# Patient Record
Sex: Female | Born: 1955 | Race: White | Hispanic: No | Marital: Married | Smoking: Never smoker
Health system: Southern US, Community
[De-identification: ages and names within clinical notes are randomized; demographics above are authoritative.]

## PROBLEM LIST (undated history)

## (undated) DIAGNOSIS — I2510 Atherosclerotic heart disease of native coronary artery without angina pectoris: Secondary | ICD-10-CM

## (undated) DIAGNOSIS — I219 Acute myocardial infarction, unspecified: Secondary | ICD-10-CM

## (undated) DIAGNOSIS — F419 Anxiety disorder, unspecified: Secondary | ICD-10-CM

## (undated) HISTORY — PX: CARDIAC CATHETERIZATION: SHX172

---

## 1993-08-31 HISTORY — PX: ABDOMINAL HYSTERECTOMY: SHX81

## 1999-11-05 ENCOUNTER — Encounter: Admission: RE | Admit: 1999-11-05 | Discharge: 1999-11-05 | Payer: Self-pay

## 2005-07-13 ENCOUNTER — Ambulatory Visit: Payer: Self-pay | Admitting: Family Medicine

## 2006-07-19 ENCOUNTER — Ambulatory Visit: Payer: Self-pay | Admitting: Family Medicine

## 2007-09-13 ENCOUNTER — Ambulatory Visit: Payer: Self-pay | Admitting: Family Medicine

## 2008-09-27 ENCOUNTER — Ambulatory Visit: Payer: Self-pay | Admitting: Family Medicine

## 2010-02-12 ENCOUNTER — Ambulatory Visit: Payer: Self-pay | Admitting: Family Medicine

## 2010-09-21 ENCOUNTER — Encounter: Payer: Self-pay | Admitting: Family Medicine

## 2011-05-13 ENCOUNTER — Ambulatory Visit: Payer: Self-pay | Admitting: Family Medicine

## 2012-06-22 ENCOUNTER — Ambulatory Visit: Payer: Self-pay | Admitting: Family Medicine

## 2012-07-17 ENCOUNTER — Ambulatory Visit: Payer: Self-pay | Admitting: Physician Assistant

## 2012-10-03 ENCOUNTER — Ambulatory Visit: Payer: Self-pay | Admitting: Gastroenterology

## 2013-07-11 ENCOUNTER — Ambulatory Visit: Payer: Self-pay | Admitting: Family Medicine

## 2013-07-26 ENCOUNTER — Ambulatory Visit: Payer: Self-pay | Admitting: Family Medicine

## 2013-07-26 IMAGING — MG MM DIGITAL DIAGNOSTIC BILAT W/ CAD
1 series · 6 of 6 positions shown · non-contrast
Comparison: [DATE] and prior mammograms dating back to
[DATE]

CLINICAL DATA: 57-year-old female for annual bilateral mammograms
and with palpable thickening in the right axilla discovered on
self-examination.

EXAM:
DIGITAL DIAGNOSTIC  BILATERAL MAMMOGRAM WITH CAD
ULTRASOUND RIGHT BREAST

[R CC · right · 6 of 6 slices shown]
[im 1/6]
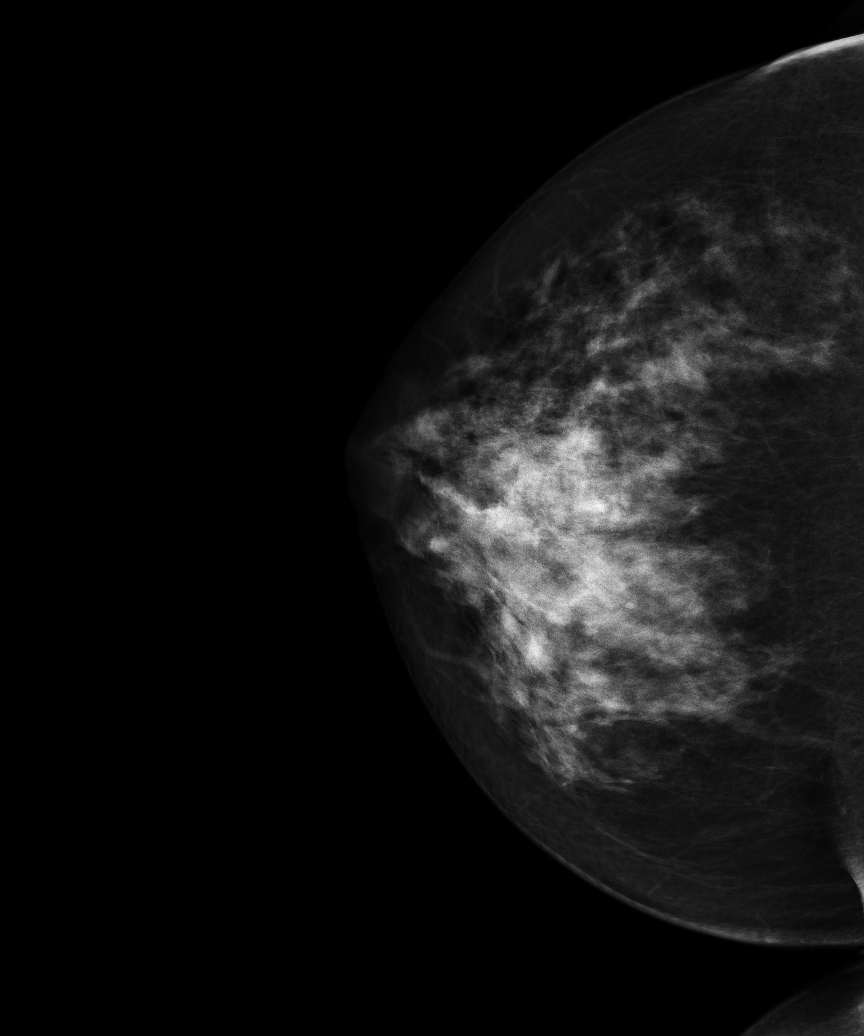
[im 2/6]
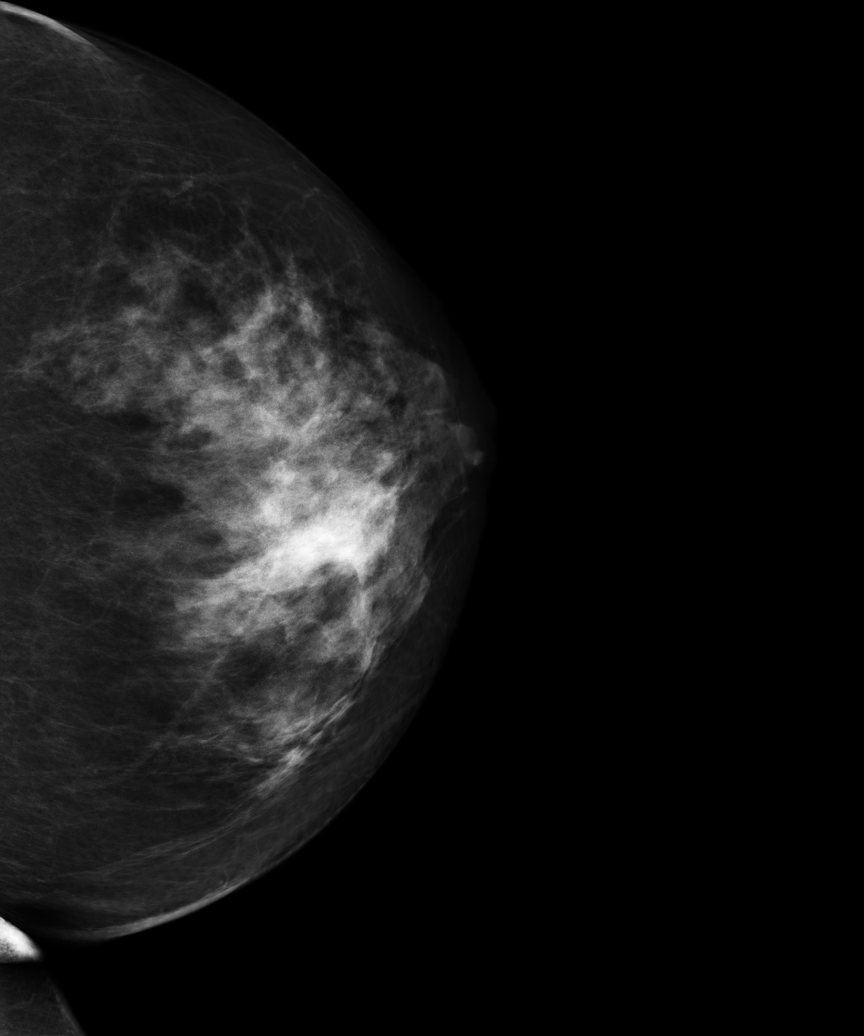
[im 3/6]
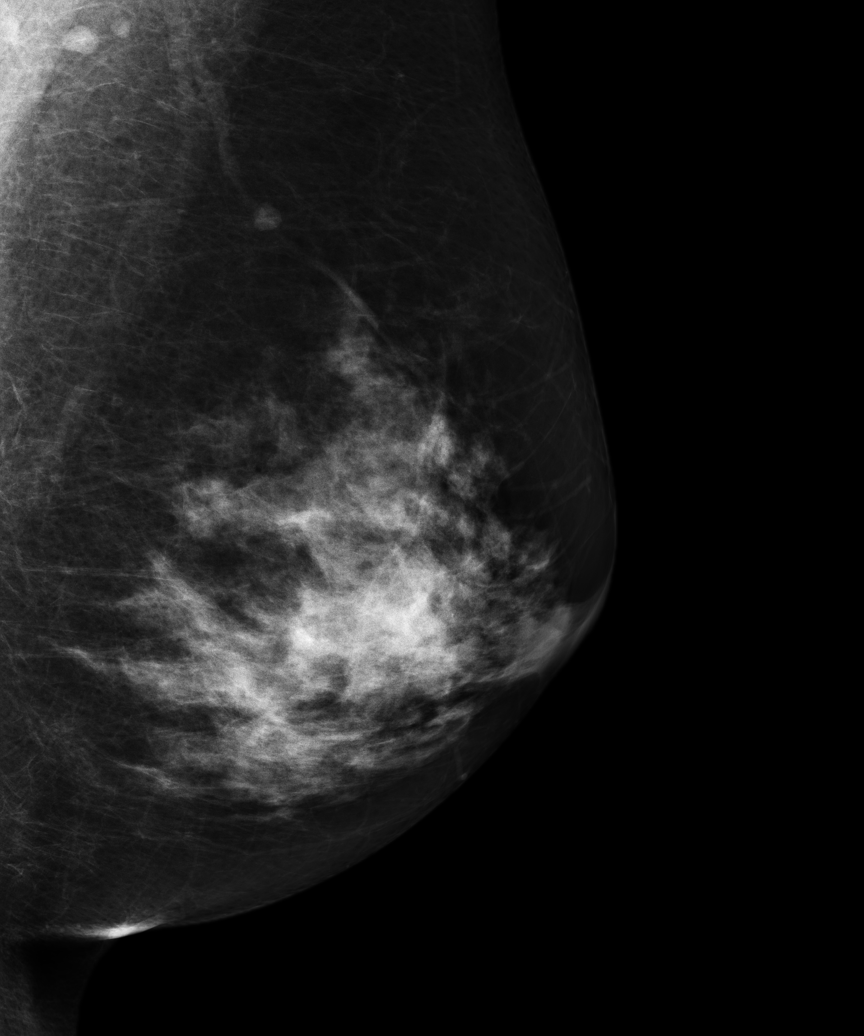
[im 4/6]
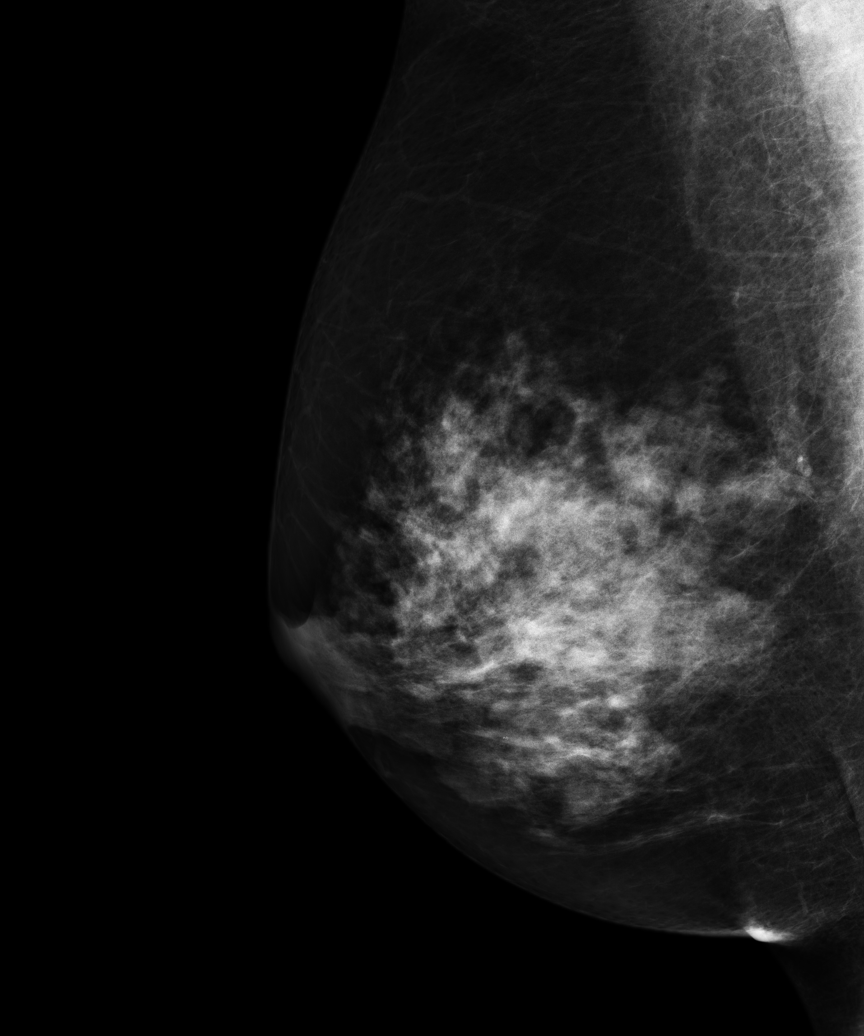
[im 5/6]
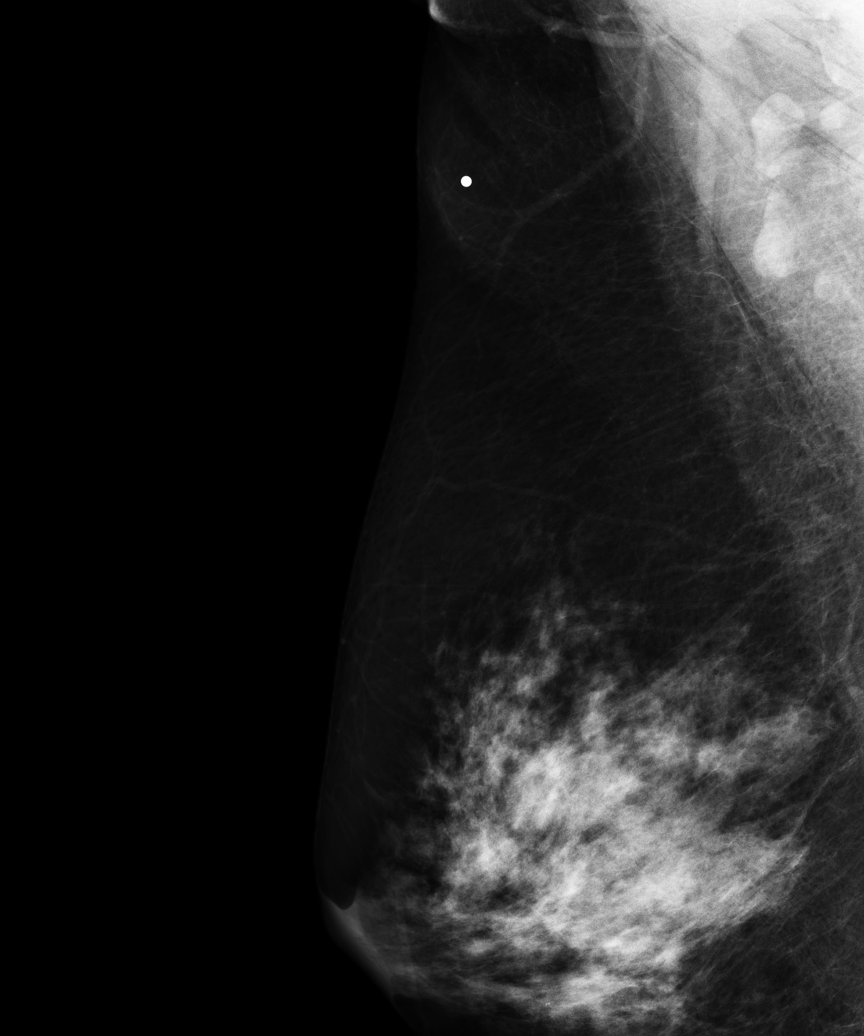
[im 6/6]
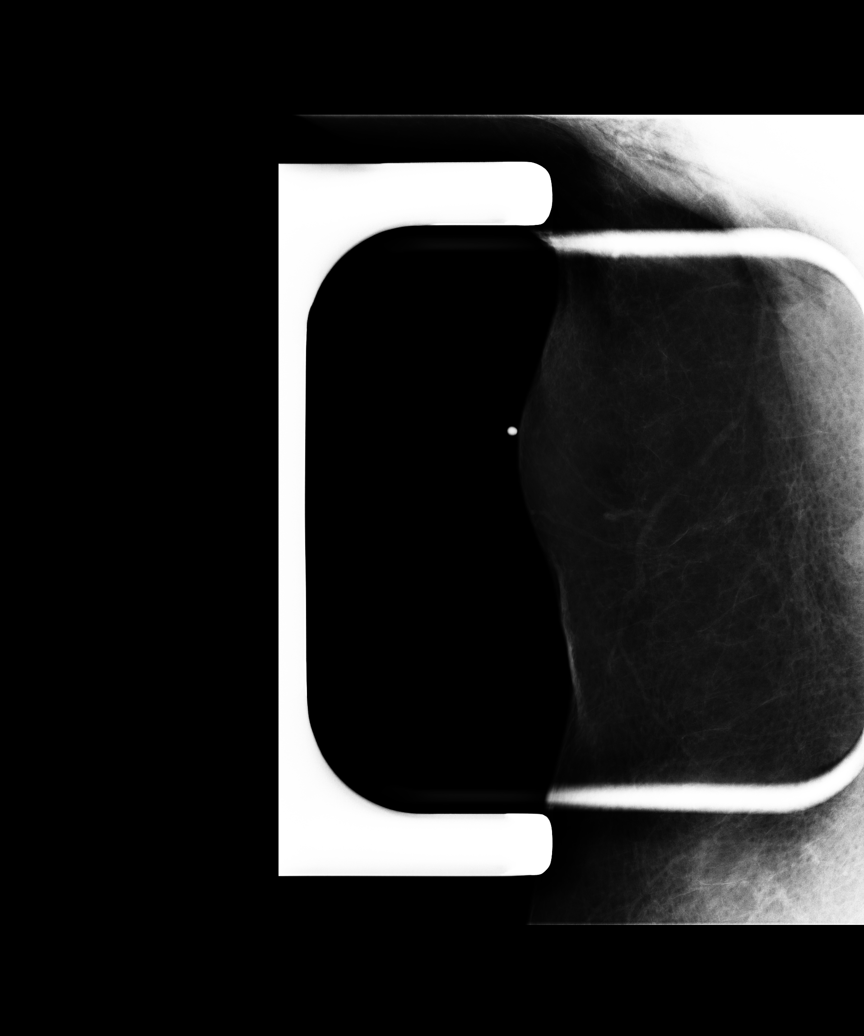

[6 of 6 positions shown; findings below may reference images not displayed]

ACR Breast Density Category c: The breasts are heterogeneously
dense, which may obscure small masses.
FINDINGS: There is no evidence of suspicious mass, distortion or worrisome
calcifications bilaterally.

A spot compression view of the right axilla demonstrates no
abnormalities.

Mammographic images were processed with CAD.

On physical exam,no palpable abnormalities are identified in the
right axilla.

Ultrasound is performed, showing no evidence of solid or cystic
mass, distortion or abnormal areas of shadowing in the right axilla,
in the area of patient concern..
IMPRESSION: No mammographic, palpable or sonographic abnormality in the right
axilla, in the area of patient concern.

No mammographic evidence of breast malignancy bilaterally.

RECOMMENDATION:
Bilateral screening mammograms in 1 year.

I have discussed the findings and recommendations with the patient.
Results were also provided in writing at the conclusion of the
visit.

BI-RADS CATEGORY  1: Negative

## 2014-09-26 ENCOUNTER — Ambulatory Visit: Payer: Self-pay | Admitting: Family Medicine

## 2014-09-26 IMAGING — MG MM DIGITAL SCREENING BILAT W/ CAD
4 series · 4 of 4 positions shown · non-contrast
Comparison: Previous exam(s).

CLINICAL DATA: Screening.

EXAM:
DIGITAL SCREENING BILATERAL MAMMOGRAM WITH CAD

[R CC]
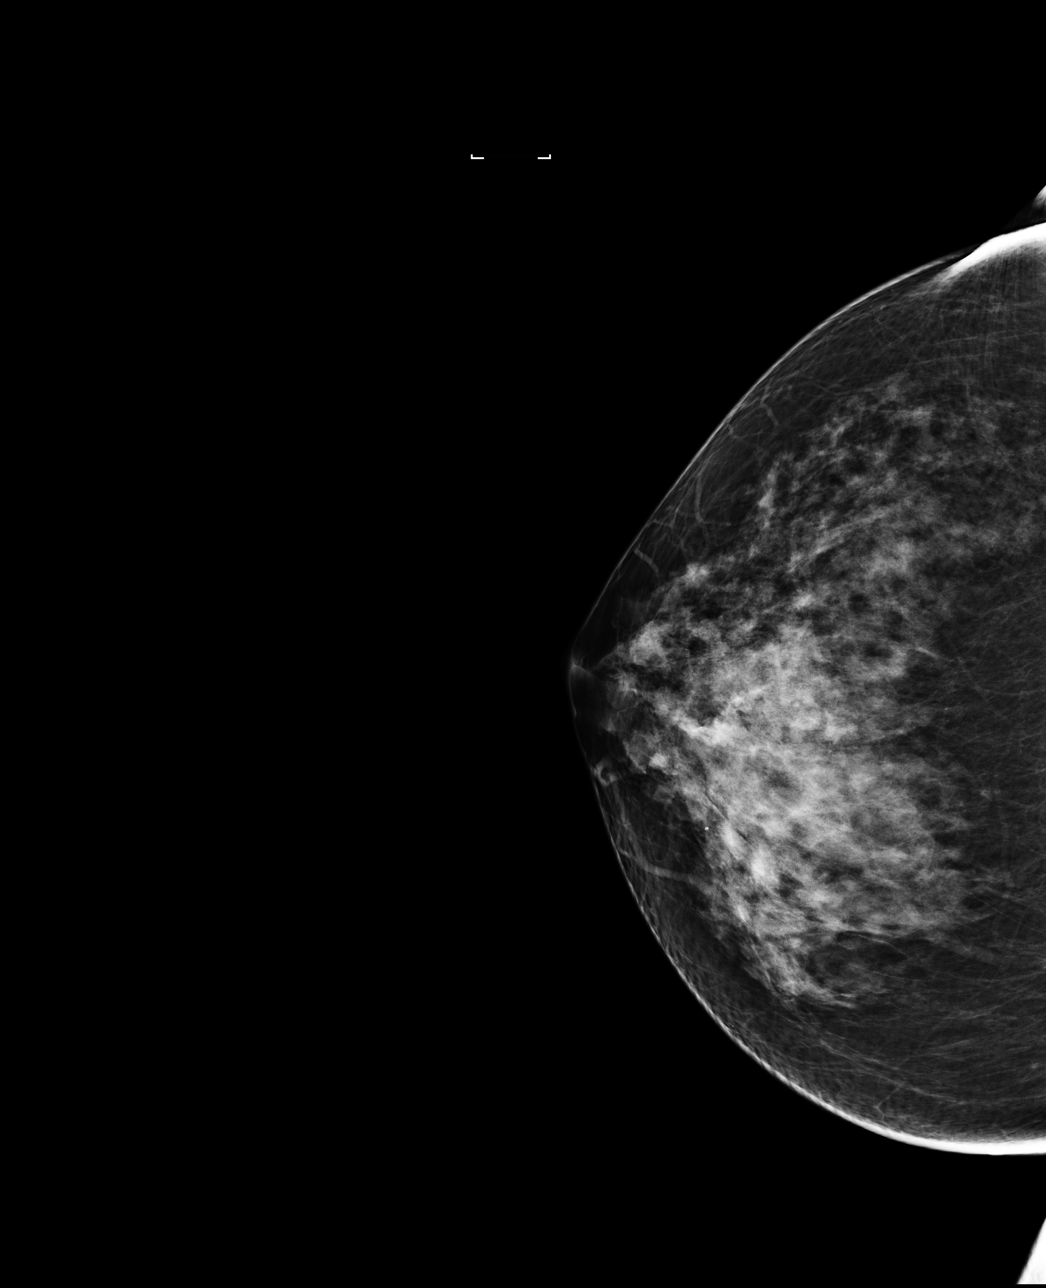

[L MLO]
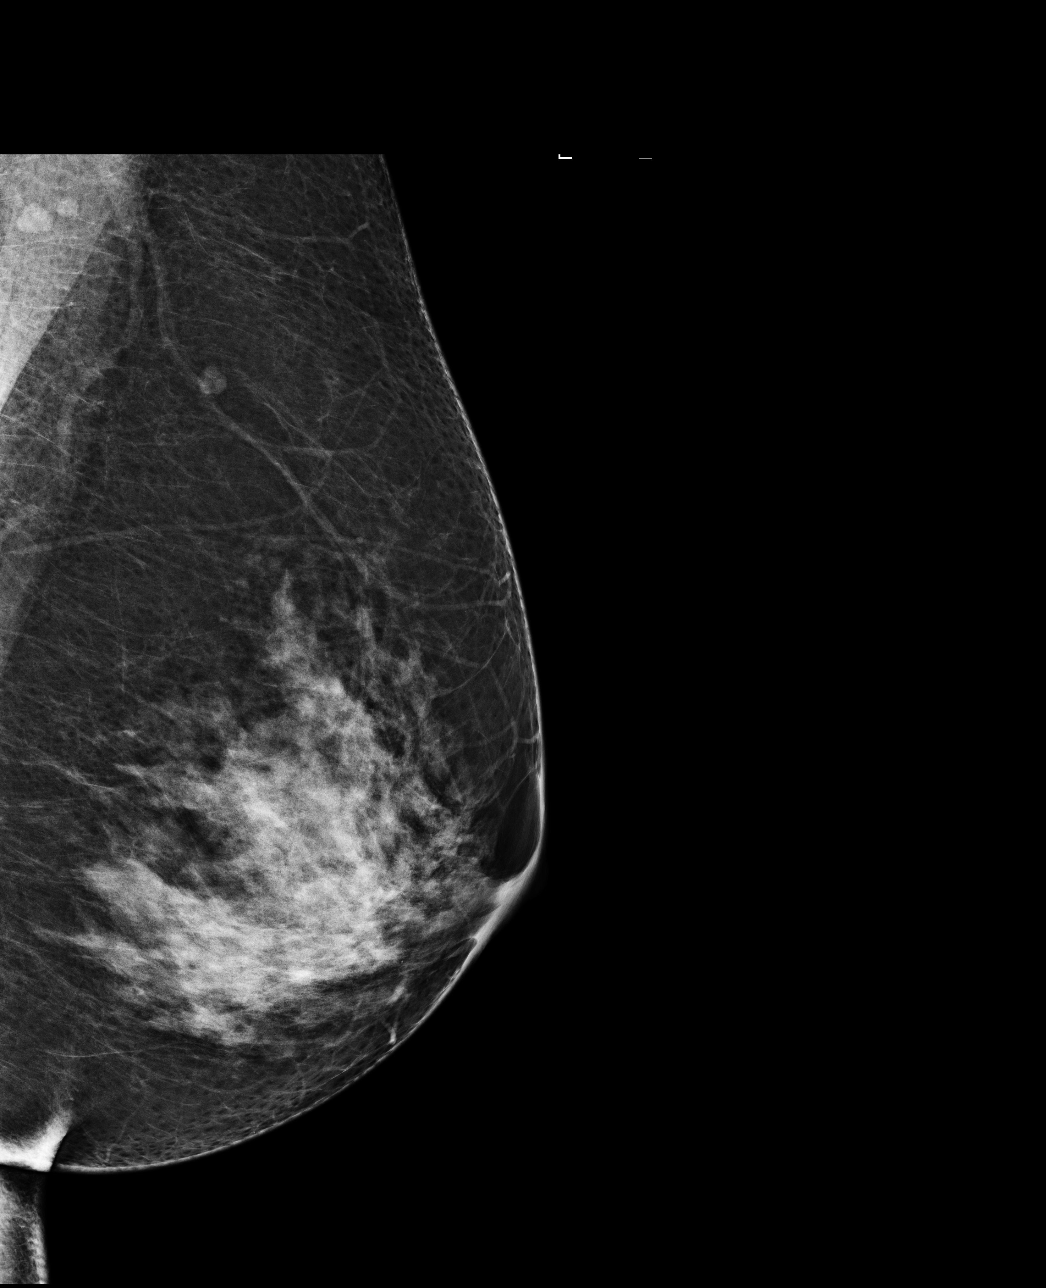

[L CC]
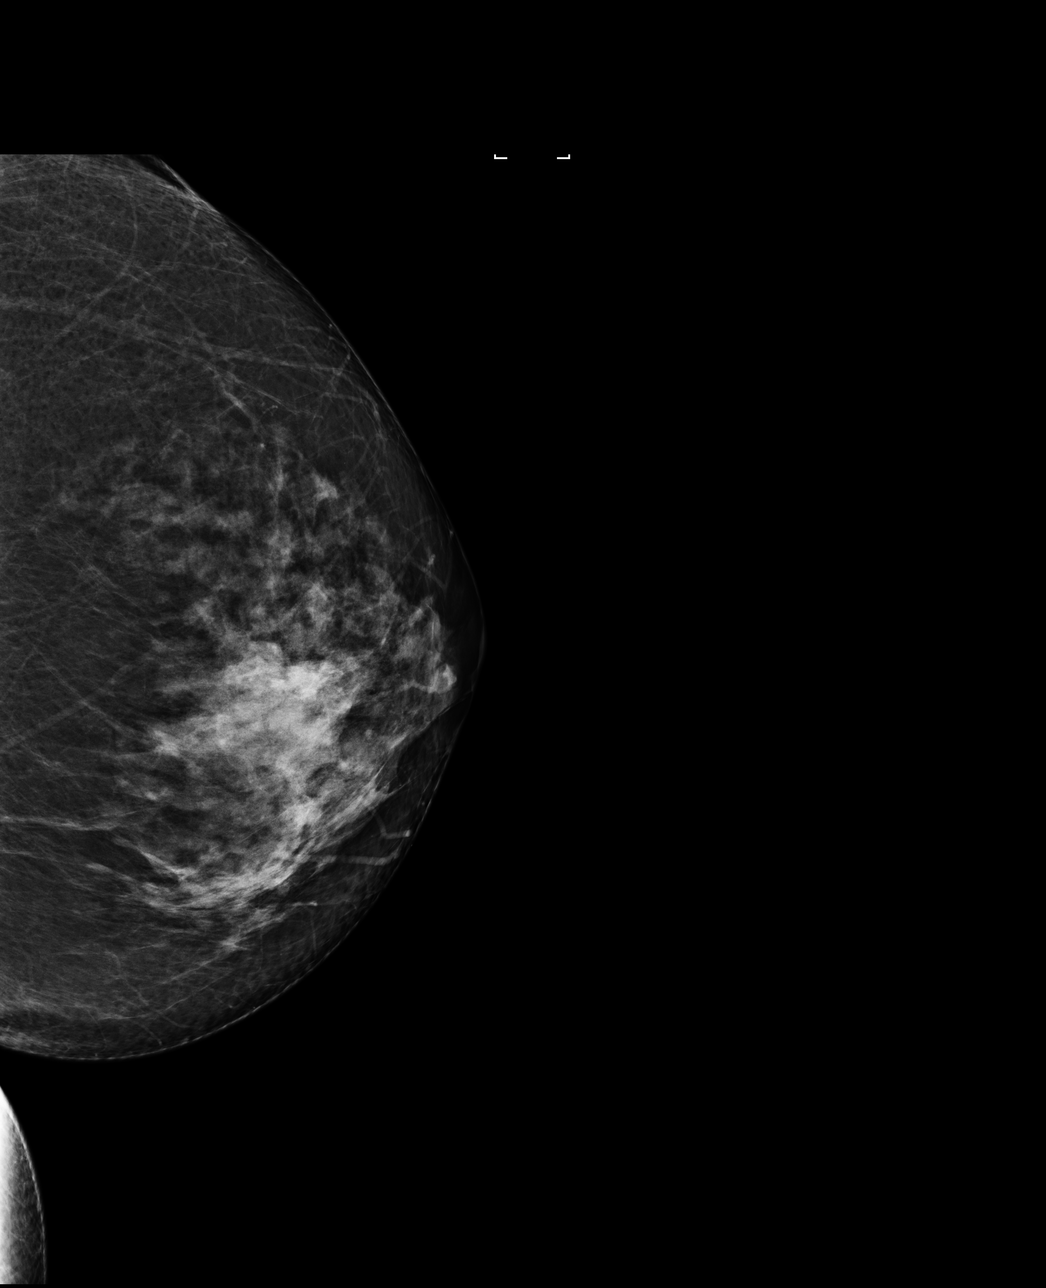

[R MLO]
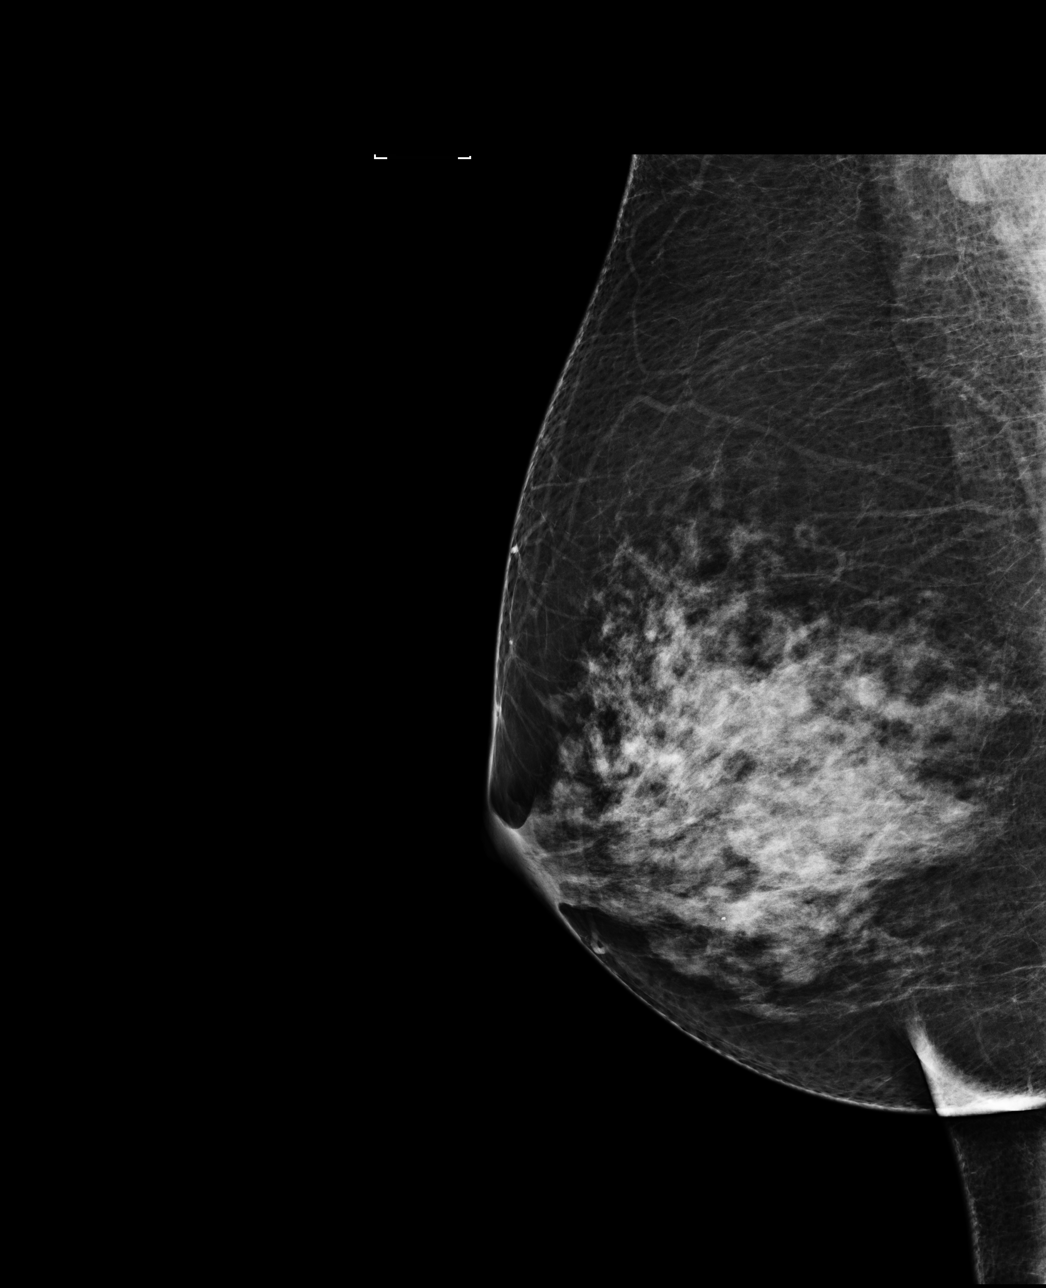

[4 of 4 positions shown; findings below may reference images not displayed]

ACR Breast Density Category c: The breast tissue is heterogeneously
dense, which may obscure small masses.
FINDINGS: There are no findings suspicious for malignancy. Images were
processed with CAD.
IMPRESSION: No mammographic evidence of malignancy. A result letter of this
screening mammogram will be mailed directly to the patient.

RECOMMENDATION:
Screening mammogram in one year. (Code:[0J])

BI-RADS CATEGORY  1: Negative.

## 2015-05-31 DIAGNOSIS — F419 Anxiety disorder, unspecified: Secondary | ICD-10-CM | POA: Insufficient documentation

## 2015-05-31 DIAGNOSIS — M8580 Other specified disorders of bone density and structure, unspecified site: Secondary | ICD-10-CM | POA: Insufficient documentation

## 2015-05-31 DIAGNOSIS — N951 Menopausal and female climacteric states: Secondary | ICD-10-CM | POA: Insufficient documentation

## 2015-05-31 DIAGNOSIS — G2581 Restless legs syndrome: Secondary | ICD-10-CM | POA: Insufficient documentation

## 2016-03-04 ENCOUNTER — Other Ambulatory Visit: Payer: Self-pay | Admitting: Internal Medicine

## 2016-03-04 DIAGNOSIS — Z1239 Encounter for other screening for malignant neoplasm of breast: Secondary | ICD-10-CM

## 2016-03-20 ENCOUNTER — Ambulatory Visit
Admission: RE | Admit: 2016-03-20 | Discharge: 2016-03-20 | Disposition: A | Discharge status: Home / Self Care | Payer: Commercial Managed Care - PPO | Source: Ambulatory Visit | Attending: Internal Medicine | Admitting: Internal Medicine

## 2016-03-20 ENCOUNTER — Other Ambulatory Visit: Payer: Self-pay | Admitting: Internal Medicine

## 2016-03-20 DIAGNOSIS — Z1231 Encounter for screening mammogram for malignant neoplasm of breast: Secondary | ICD-10-CM | POA: Insufficient documentation

## 2016-03-20 DIAGNOSIS — Z1239 Encounter for other screening for malignant neoplasm of breast: Secondary | ICD-10-CM

## 2016-03-20 IMAGING — MG MM DIGITAL SCREENING BILAT W/ TOMO W/ CAD
8 of 14 series · 8 of 30 positions shown · non-contrast
Comparison: Previous exam(s).

CLINICAL DATA: Screening.

EXAM:
2D DIGITAL SCREENING BILATERAL MAMMOGRAM WITH CAD AND ADJUNCT TOMO

[R MLO (1 of 2)]
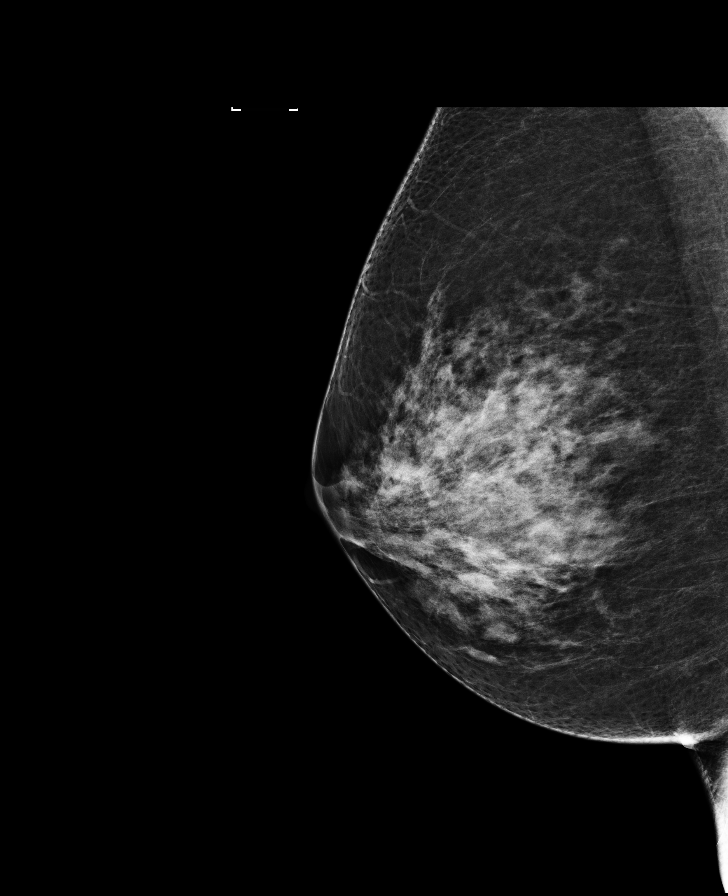

[R CC (1 of 2)]
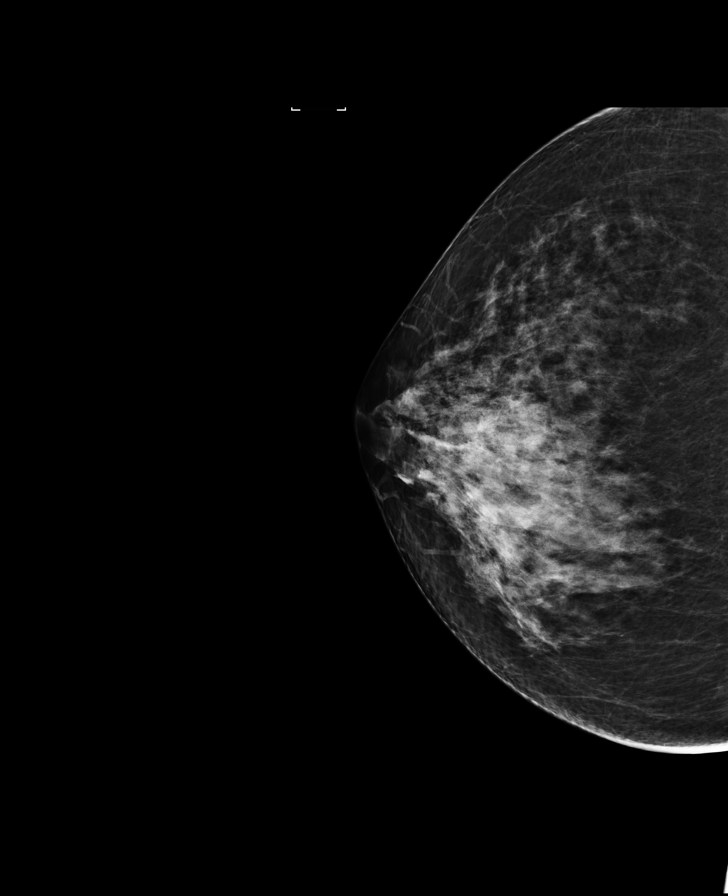

[R MLO (2 of 2)]
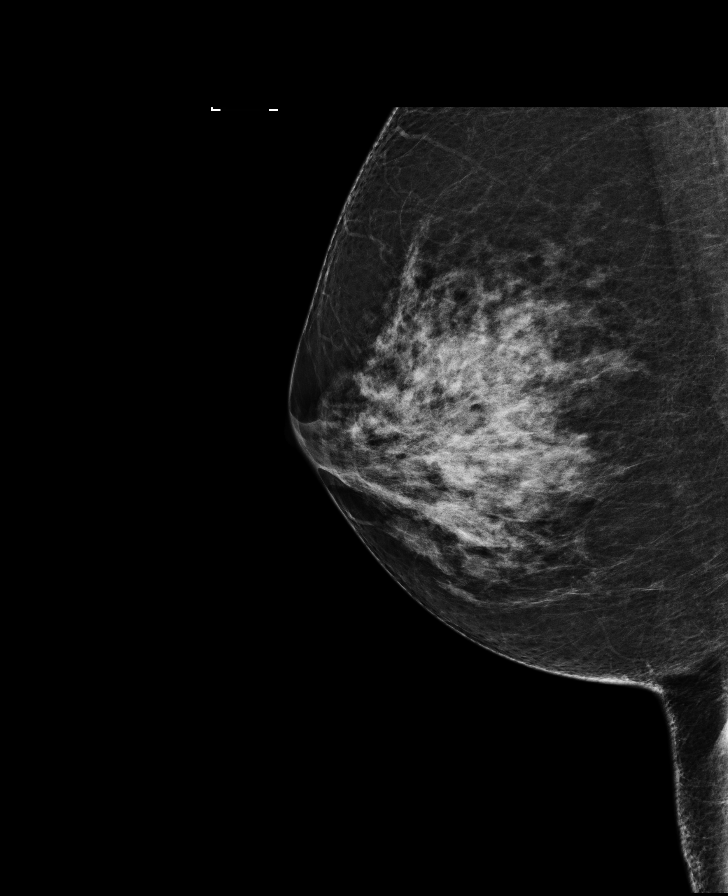

[R CC (2 of 2)]
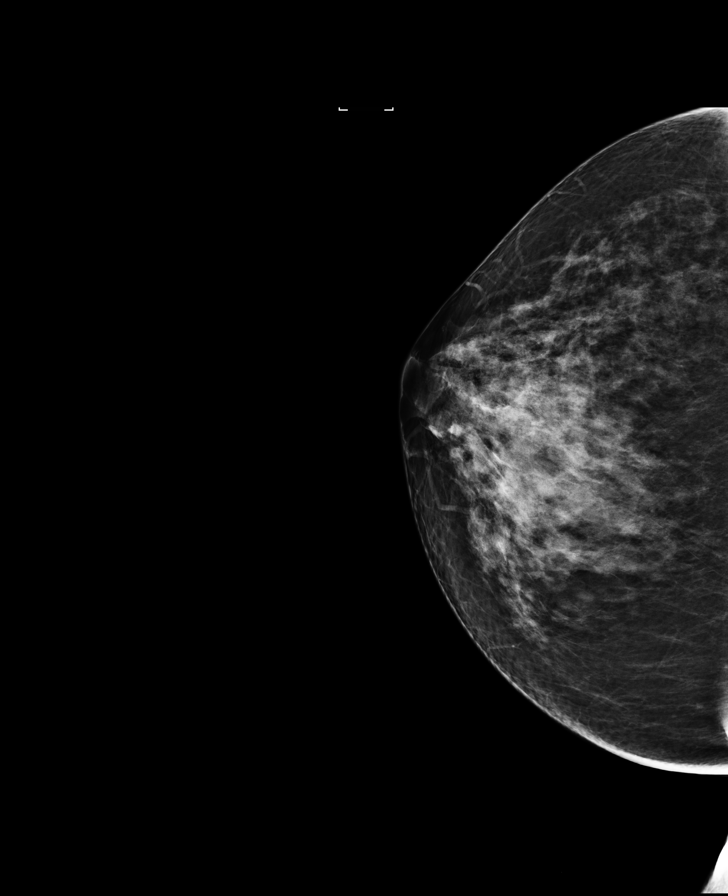

[R MLO synth-2D]
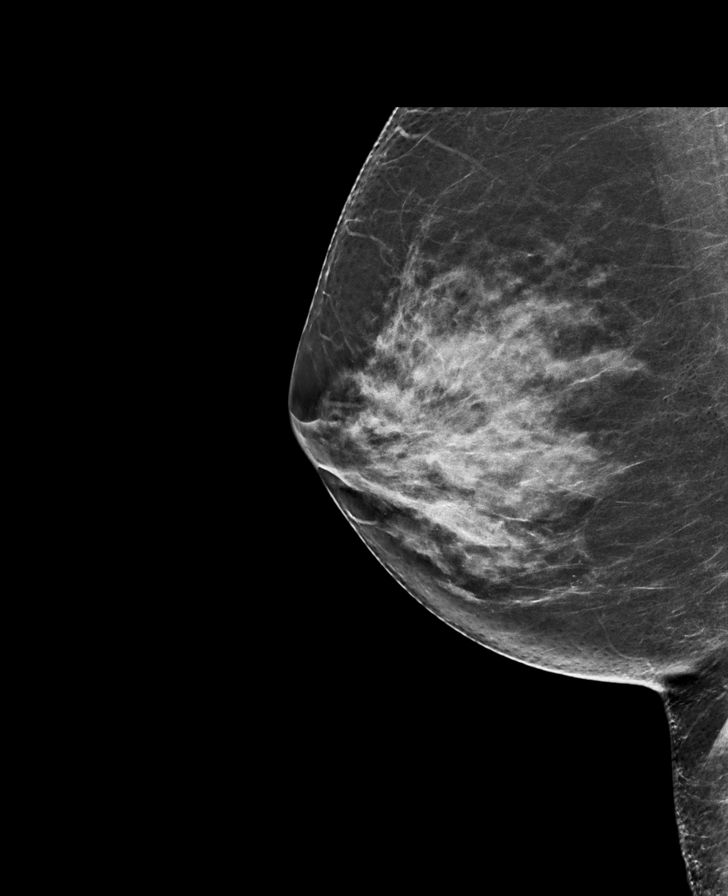

[L MLO synth-2D]
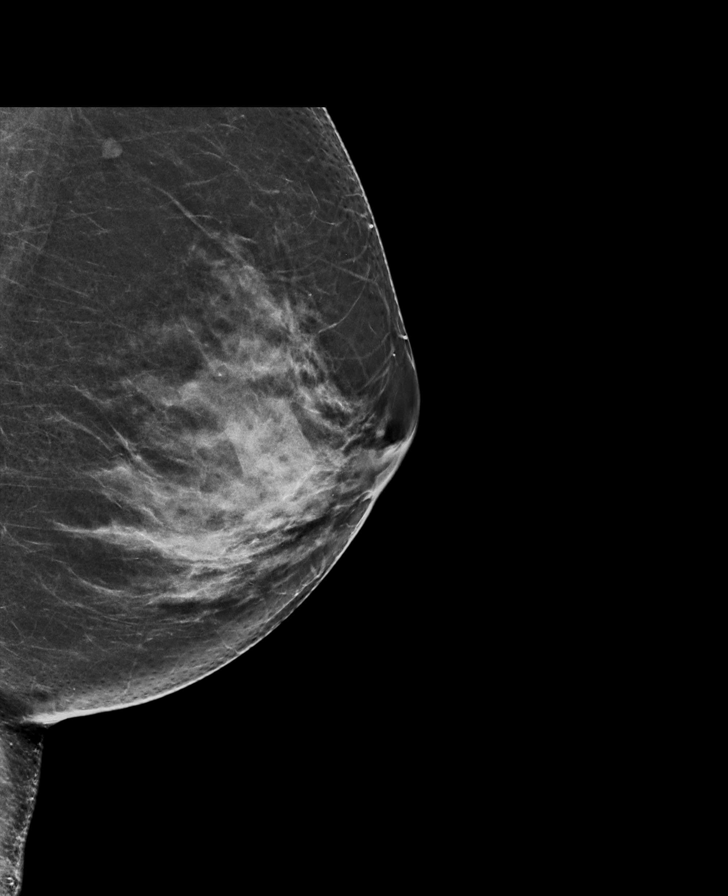

[R CC synth-2D]
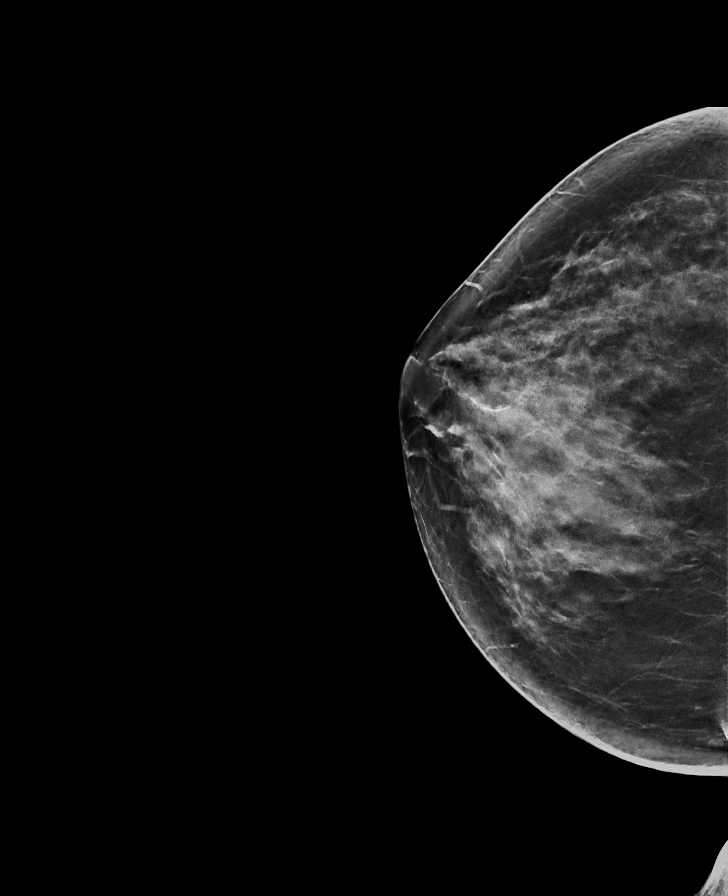

[L CC]
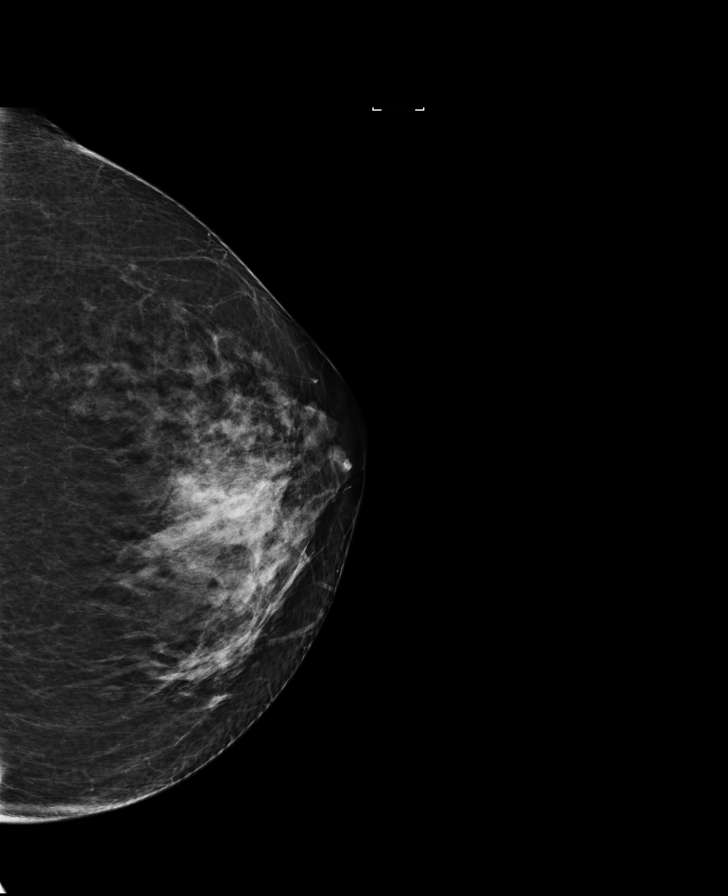

[8 of 30 positions shown; findings below may reference images not displayed]

ACR Breast Density Category d: The breast tissue is extremely dense,
which lowers the sensitivity of mammography.
FINDINGS: There are no findings suspicious for malignancy. Images were
processed with CAD.
IMPRESSION: No mammographic evidence of malignancy. A result letter of this
screening mammogram will be mailed directly to the patient.

RECOMMENDATION:
Screening mammogram in one year. (Code:[5L])

BI-RADS CATEGORY  1: Negative.

## 2016-07-29 DIAGNOSIS — G250 Essential tremor: Secondary | ICD-10-CM | POA: Insufficient documentation

## 2016-07-29 DIAGNOSIS — R7303 Prediabetes: Secondary | ICD-10-CM | POA: Insufficient documentation

## 2016-12-24 ENCOUNTER — Emergency Department
Admission: EM | Admit: 2016-12-24 | Discharge: 2016-12-24 | Disposition: A | Discharge status: Home / Self Care | Payer: Commercial Managed Care - PPO | Attending: Student in an Organized Health Care Education/Training Program | Admitting: Student in an Organized Health Care Education/Training Program

## 2016-12-24 ENCOUNTER — Encounter: Payer: Self-pay | Admitting: Emergency Medicine

## 2016-12-24 DIAGNOSIS — N3001 Acute cystitis with hematuria: Secondary | ICD-10-CM | POA: Insufficient documentation

## 2016-12-24 DIAGNOSIS — R319 Hematuria, unspecified: Secondary | ICD-10-CM | POA: Diagnosis present

## 2016-12-24 LAB — URINALYSIS, COMPLETE (UACMP) WITH MICROSCOPIC
Bacteria, UA: NONE SEEN
Bilirubin Urine: NEGATIVE
Glucose, UA: NEGATIVE mg/dL
Ketones, ur: NEGATIVE mg/dL
Nitrite: NEGATIVE
PH: 6 (ref 5.0–8.0)
Protein, ur: 100 mg/dL — AB
SPECIFIC GRAVITY, URINE: 1.013 (ref 1.005–1.030)

## 2016-12-24 MED ORDER — NITROFURANTOIN MONOHYD MACRO 100 MG PO CAPS
ORAL_CAPSULE | ORAL | Status: AC
  Administered 2016-12-24: 100 mg via ORAL
  Filled 2016-12-24: qty 1

## 2016-12-24 MED ORDER — PHENAZOPYRIDINE HCL 100 MG PO TABS
95.0000 mg | ORAL_TABLET | Freq: Once | ORAL | Status: AC
  Administered 2016-12-24: 100 mg via ORAL
  Filled 2016-12-24: qty 1

## 2016-12-24 MED ORDER — PHENAZOPYRIDINE HCL 200 MG PO TABS
ORAL_TABLET | ORAL | Status: AC
  Administered 2016-12-24: 100 mg via ORAL
  Filled 2016-12-24: qty 1

## 2016-12-24 MED ORDER — NITROFURANTOIN MONOHYD MACRO 100 MG PO CAPS: 100.0000 mg | ORAL_CAPSULE | Freq: Two times a day (BID) | ORAL | 0 refills | Status: AC

## 2016-12-24 MED ORDER — NITROFURANTOIN MONOHYD MACRO 100 MG PO CAPS
100.0000 mg | ORAL_CAPSULE | Freq: Once | ORAL | Status: AC
  Administered 2016-12-24: 100 mg via ORAL
  Filled 2016-12-24: qty 1

## 2016-12-24 NOTE — ED Notes (Signed)

## 2016-12-24 NOTE — ED Provider Notes (Signed)
[facility] Emergency Department Provider Note    None    (approximate)  I have reviewed the triage vital signs and the nursing notes.   HISTORY  Chief Complaint Urinary Frequency and Hematuria    HPI Renee Stephens is a 61 y.o. female presents with chief complaint day of increased urinary frequency difficulty in taking her bladder since Monday and hematuria that started today. No flank pain. Denies any vaginal discharge. No fevers. States that she does have pain as 10 out of 10 in severity when she is urinating. Describes as burning in sensation.  No history of recurrent UTIs. No recent antibiotic use. Took one Cystex this afternoon with some improvement. Denies any flank pain.  No new medications.   History reviewed. No pertinent past medical history. Family History  Problem Relation Age of Onset  . Breast cancer Neg Hx    Past Surgical History:  Procedure Laterality Date  . ABDOMINAL HYSTERECTOMY     There are no active problems to display for this patient.     Prior to Admission medications   Medication Sig Start Date End Date Taking? Authorizing Provider  nitrofurantoin, macrocrystal-monohydrate, (MACROBID) 100 MG capsule Take 1 capsule (100 mg total) by mouth 2 (two) times daily. 12/24/16 12/29/16  Merlyn Lot, MD    Allergies Patient has no known allergies.    Social History Social History  Substance Use Topics  . Smoking status: Never Smoker  . Smokeless tobacco: Never Used  . Alcohol use No    Review of Systems Patient denies headaches, rhinorrhea, blurry vision, numbness, shortness of breath, chest pain, edema, cough, abdominal pain, nausea, vomiting, diarrhea, dysuria, fevers, rashes or hallucinations unless otherwise stated above in HPI. ____________________________________________   PHYSICAL EXAM:  VITAL SIGNS: Vitals:   12/24/16 1954 12/24/16 2128  BP: (!) 146/91 134/67  Pulse: 90 82  Resp: 16 18  Temp:  98.4 F (36.9 C)     Constitutional: Alert and oriented. Well appearing and in no acute distress. Eyes: Conjunctivae are normal. PERRL. EOMI. Head: Atraumatic. Nose: No congestion/rhinnorhea. Mouth/Throat: Mucous membranes are moist.  Oropharynx non-erythematous. Neck: No stridor. Painless ROM. No cervical spine tenderness to palpation Hematological/Lymphatic/Immunilogical: No cervical lymphadenopathy. Cardiovascular: Normal rate, regular rhythm. Grossly normal heart sounds.  Good peripheral circulation. Respiratory: Normal respiratory effort.  No retractions. Lungs CTAB. Gastrointestinal: Soft and nontender. No distention. No abdominal bruits. No CVA tenderness. Musculoskeletal: No lower extremity tenderness nor edema.  No joint effusions. Neurologic:  Normal speech and language. No gross focal neurologic deficits are appreciated. No gait instability. Skin:  Skin is warm, dry and intact. No rash noted. Psychiatric: Mood and affect are normal. Speech and behavior are normal.  ____________________________________________   LABS (all labs ordered are listed, but only abnormal results are displayed)  Results for orders placed or performed during the hospital encounter of 12/24/16 (from the past 24 hour(s))  Urinalysis, Complete w Microscopic     Status: Abnormal   Collection Time: 12/24/16  7:53 PM  Result Value Ref Range   Color, Urine YELLOW (A) YELLOW   APPearance CLOUDY (A) CLEAR   Specific Gravity, Urine 1.013 1.005 - 1.030   pH 6.0 5.0 - 8.0   Glucose, UA NEGATIVE NEGATIVE mg/dL   Hgb urine dipstick LARGE (A) NEGATIVE   Bilirubin Urine NEGATIVE NEGATIVE   Ketones, ur NEGATIVE NEGATIVE mg/dL   Protein, ur 100 (A) NEGATIVE mg/dL   Nitrite NEGATIVE NEGATIVE   Leukocytes, UA LARGE (A) NEGATIVE  RBC / HPF TOO NUMEROUS TO COUNT 0 - 5 RBC/hpf   WBC, UA TOO NUMEROUS TO COUNT 0 - 5 WBC/hpf   Bacteria, UA NONE SEEN NONE SEEN   Squamous Epithelial / LPF 0-5 (A) NONE SEEN   WBC  Clumps PRESENT    Non Squamous Epithelial 0-5 (A) NONE SEEN   ____________________________________________ ____________________________________________  RADIOLOGY   ____________________________________________   PROCEDURES  Procedure(s) performed:  Procedures    Critical Care performed: no ____________________________________________   INITIAL IMPRESSION / ASSESSMENT AND PLAN / ED COURSE  Pertinent labs & imaging results that were available during my care of the patient were reviewed by me and considered in my medical decision making (see chart for details).  DDX: cystitis, urethritis, pyelonephritis  [name] is a 61 y.o. who presents to the ED with Dysuria, frequency and now hematuria. Patient's afebrile and otherwise stable. Denies any other complaints. No flank pain. Her abdominal exam is soft and benign. Setting of dysuria with increased frequency of March white cells and red cells on urinalysis do suspect some component of cystitis and will treat accordingly. No significant bacteria on her urinalysis therefore will send for culture. She has no signs of pyelonephritis will start on Macrobid. Do not feel that further diagnostic testing is clinically indicated at this time.  Patient was able to tolerate PO and was able to ambulate with a steady gait.  Have discussed with the patient and available family all diagnostics and treatments performed thus far and all questions were answered to the best of my ability. The patient demonstrates understanding and agreement with plan.       ____________________________________________   FINAL CLINICAL IMPRESSION(S) / ED DIAGNOSES  Final diagnoses:  Acute cystitis with hematuria      NEW MEDICATIONS STARTED DURING THIS VISIT:  Discharge Medication List as of 12/24/2016  9:21 PM    START taking these medications   Details  nitrofurantoin, macrocrystal-monohydrate, (MACROBID) 100 MG capsule Take 1 capsule (100 mg  total) by mouth 2 (two) times daily., Starting Thu 12/24/2016, Until Tue 12/29/2016, Print         Note:  This document was prepared using Dragon voice recognition software and may include unintentional dictation errors.    Merlyn Lot, MD 12/24/16 2234

## 2016-12-24 NOTE — ED Triage Notes (Signed)
Pt ambulatory to triage with steady fast gait, pt look to be in pain. Pt c/o urinary frequency, retention, hematuria since Monday. Pt to ED today for worsening symptoms and sts the hematuria started today and pt unable to get to urgent care before closing. Pts pain 10/10.

## 2016-12-27 LAB — URINE CULTURE

## 2017-02-16 ENCOUNTER — Other Ambulatory Visit: Payer: Self-pay | Admitting: Internal Medicine

## 2017-02-16 DIAGNOSIS — Z1231 Encounter for screening mammogram for malignant neoplasm of breast: Secondary | ICD-10-CM

## 2017-03-10 ENCOUNTER — Encounter: Payer: Self-pay | Admitting: *Deleted

## 2017-03-10 ENCOUNTER — Emergency Department
Admission: EM | Admit: 2017-03-10 | Discharge: 2017-03-11 | Disposition: A | Discharge status: Home / Self Care | Payer: Commercial Managed Care - PPO | Attending: Emergency Medicine | Admitting: Emergency Medicine

## 2017-03-10 DIAGNOSIS — N132 Hydronephrosis with renal and ureteral calculous obstruction: Secondary | ICD-10-CM | POA: Diagnosis not present

## 2017-03-10 DIAGNOSIS — R1032 Left lower quadrant pain: Secondary | ICD-10-CM | POA: Diagnosis present

## 2017-03-10 DIAGNOSIS — N23 Unspecified renal colic: Secondary | ICD-10-CM | POA: Insufficient documentation

## 2017-03-10 LAB — CBC WITH DIFFERENTIAL/PLATELET
Basophils Absolute: 0.1 10*3/uL (ref 0–0.1)
Basophils Relative: 1 %
EOS ABS: 0.4 10*3/uL (ref 0–0.7)
Eosinophils Relative: 5 %
HEMATOCRIT: 36.5 % (ref 35.0–47.0)
HEMOGLOBIN: 12.6 g/dL (ref 12.0–16.0)
LYMPHS ABS: 3.3 10*3/uL (ref 1.0–3.6)
Lymphocytes Relative: 41 %
MCH: 29.4 pg (ref 26.0–34.0)
MCHC: 34.5 g/dL (ref 32.0–36.0)
MCV: 85.1 fL (ref 80.0–100.0)
MONO ABS: 0.9 10*3/uL (ref 0.2–0.9)
MONOS PCT: 12 %
NEUTROS ABS: 3.2 10*3/uL (ref 1.4–6.5)
Neutrophils Relative %: 41 %
Platelets: 286 10*3/uL (ref 150–440)
RBC: 4.29 MIL/uL (ref 3.80–5.20)
RDW: 14.7 % — AB (ref 11.5–14.5)
WBC: 7.8 10*3/uL (ref 3.6–11.0)

## 2017-03-10 LAB — COMPREHENSIVE METABOLIC PANEL
ALT: 32 U/L (ref 14–54)
AST: 33 U/L (ref 15–41)
Albumin: 4.4 g/dL (ref 3.5–5.0)
Alkaline Phosphatase: 76 U/L (ref 38–126)
Anion gap: 11 (ref 5–15)
BILIRUBIN TOTAL: 0.7 mg/dL (ref 0.3–1.2)
BUN: 26 mg/dL — ABNORMAL HIGH (ref 6–20)
CALCIUM: 9.5 mg/dL (ref 8.9–10.3)
CO2: 23 mmol/L (ref 22–32)
Chloride: 105 mmol/L (ref 101–111)
Creatinine, Ser: 0.95 mg/dL (ref 0.44–1.00)
GFR calc non Af Amer: >60 mL/min (ref >60)
Glucose, Bld: 115 mg/dL — ABNORMAL HIGH (ref 65–99)
POTASSIUM: 3.7 mmol/L (ref 3.5–5.1)
Sodium: 139 mmol/L (ref 135–145)
TOTAL PROTEIN: 7.6 g/dL (ref 6.5–8.1)

## 2017-03-10 LAB — LIPASE, BLOOD: LIPASE: 35 U/L (ref 11–51)

## 2017-03-10 MED ORDER — MORPHINE SULFATE (PF) 4 MG/ML IV SOLN
4.0000 mg | Freq: Once | INTRAVENOUS | Status: AC
  Administered 2017-03-10: 4 mg via INTRAVENOUS
  Filled 2017-03-10: qty 1

## 2017-03-10 MED ORDER — KETOROLAC TROMETHAMINE 30 MG/ML IJ SOLN
15.0000 mg | Freq: Once | INTRAMUSCULAR | Status: AC
  Administered 2017-03-11: 15 mg via INTRAVENOUS
  Filled 2017-03-10: qty 1

## 2017-03-10 MED ORDER — ONDANSETRON HCL 4 MG/2ML IJ SOLN
4.0000 mg | INTRAMUSCULAR | Status: AC
  Administered 2017-03-10: 4 mg via INTRAVENOUS
  Filled 2017-03-10: qty 2

## 2017-03-10 NOTE — ED Notes (Signed)
Pt unable to void at this time, will try again in a few

## 2017-03-10 NOTE — ED Triage Notes (Addendum)
Pt to triage via wheelchair. Pt has left flank pain.  Pt also reports nausea.  Pt taking abx for recent uti.  Pt tearful in triage.

## 2017-03-11 ENCOUNTER — Other Ambulatory Visit: Payer: Commercial Managed Care - PPO

## 2017-03-11 ENCOUNTER — Emergency Department: Payer: Commercial Managed Care - PPO

## 2017-03-11 LAB — URINALYSIS, COMPLETE (UACMP) WITH MICROSCOPIC
BILIRUBIN URINE: NEGATIVE
Bacteria, UA: NONE SEEN
GLUCOSE, UA: NEGATIVE mg/dL
KETONES UR: 20 mg/dL — AB
Leukocytes, UA: NEGATIVE
NITRITE: NEGATIVE
PH: 5 (ref 5.0–8.0)
Protein, ur: NEGATIVE mg/dL
SPECIFIC GRAVITY, URINE: 1.029 (ref 1.005–1.030)

## 2017-03-11 IMAGING — CT CT RENAL STONE PROTOCOL
2 of 4 series · 16 of 46 positions shown, 18 images · non-contrast
Comparison: None.

CLINICAL DATA: Left flank pain and nausea.  Dysuria.

EXAM:
CT ABDOMEN AND PELVIS WITHOUT CONTRAST
TECHNIQUE: Multidetector CT imaging of the abdomen and pelvis was performed
following the standard protocol without IV contrast.

[Series 2: stone full standard · axial · 0.78mm/px · z∈[+104,+520]mm · 13 of 91 slices shown, 15 images]
[im 4/91  soft-tissue]
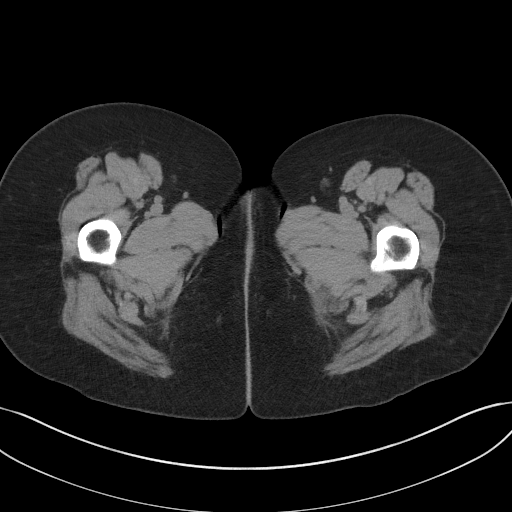
[im 4/91  bone]
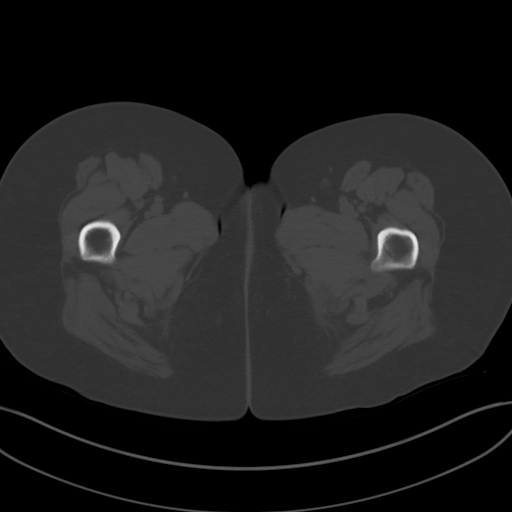
[im 11/91  soft-tissue]
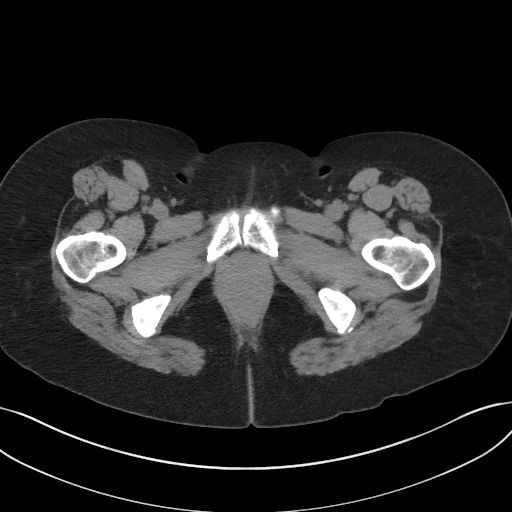
[im 19/91  soft-tissue]
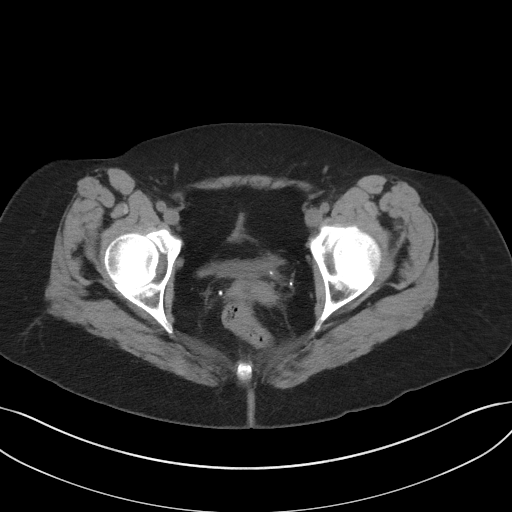
[im 26/91  soft-tissue]
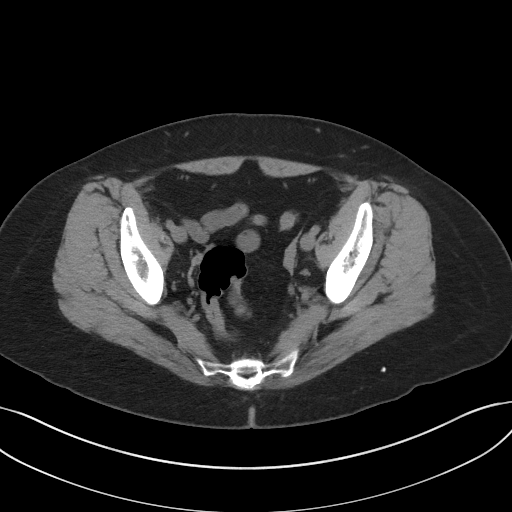
[im 33/91  soft-tissue]
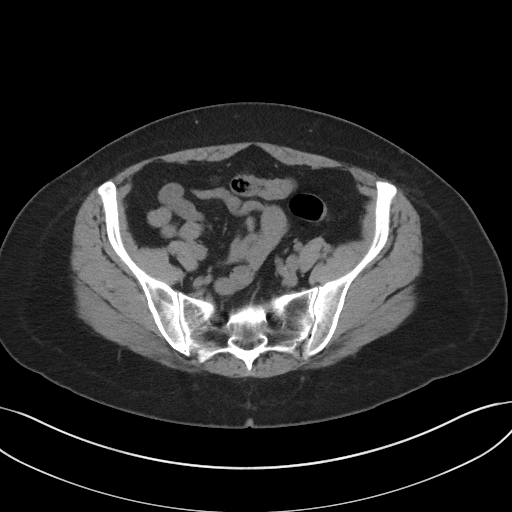
[im 40/91  soft-tissue]
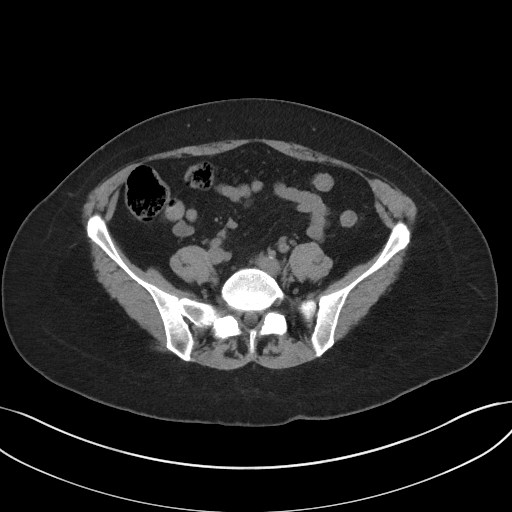
[im 47/91  soft-tissue]
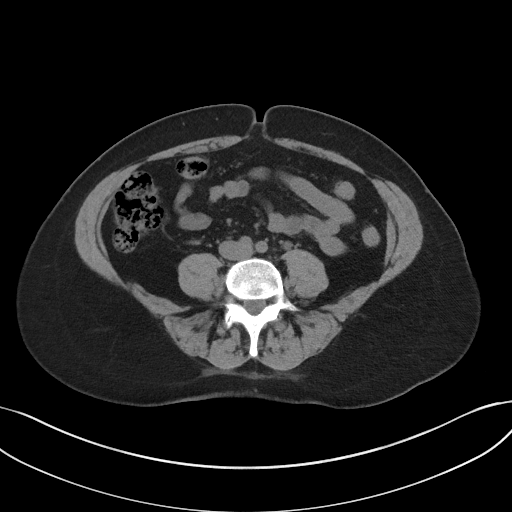
[im 51/91  soft-tissue]
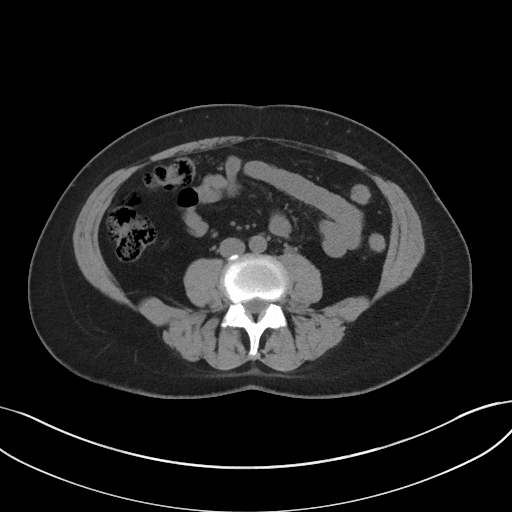
[im 58/91  soft-tissue]
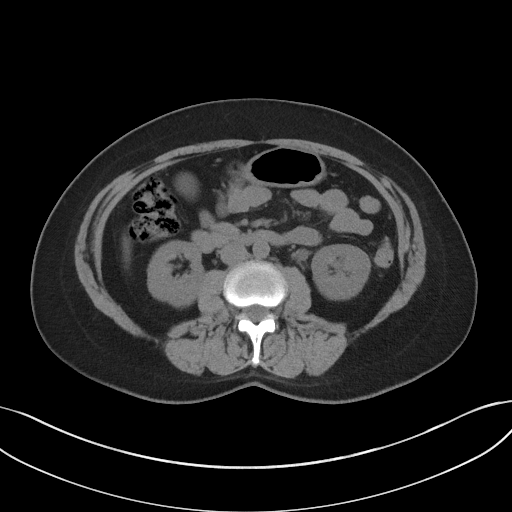
[im 58/91  bone]
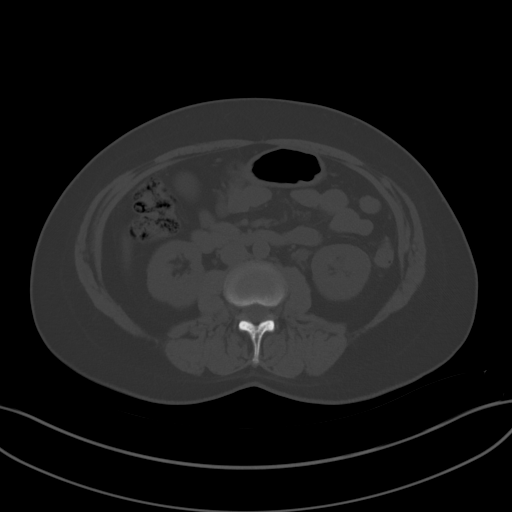
[im 65/91  soft-tissue]
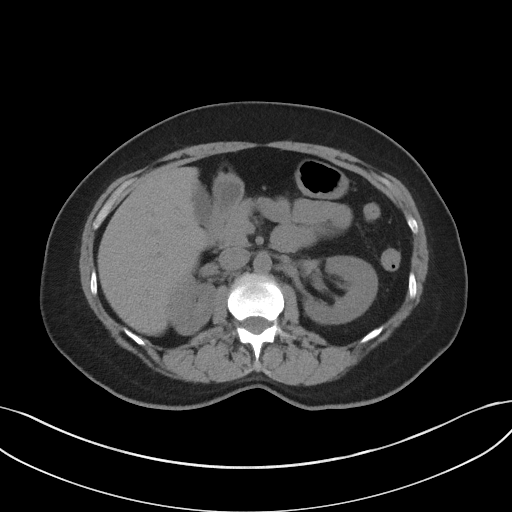
[im 73/91  soft-tissue]
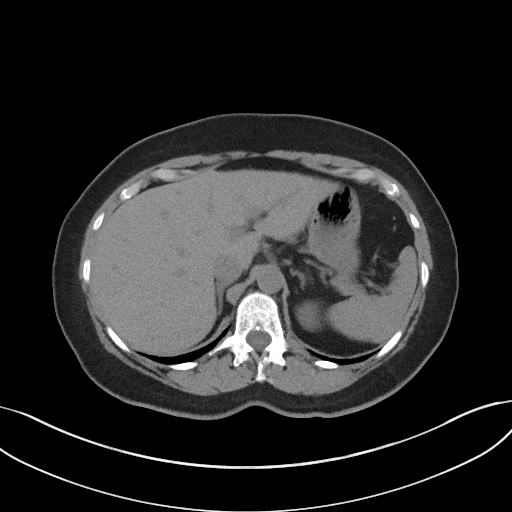
[im 80/91  soft-tissue]
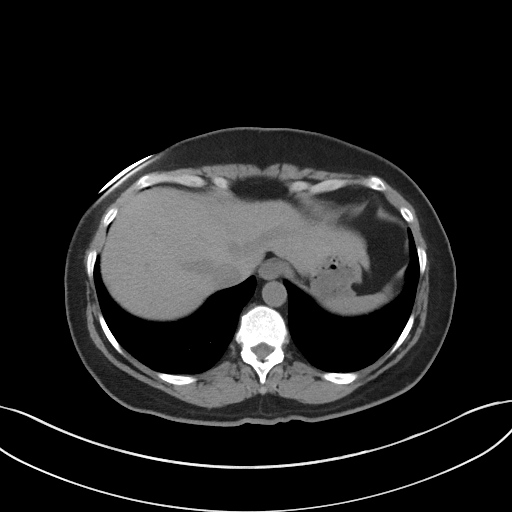
[im 87/91  soft-tissue]
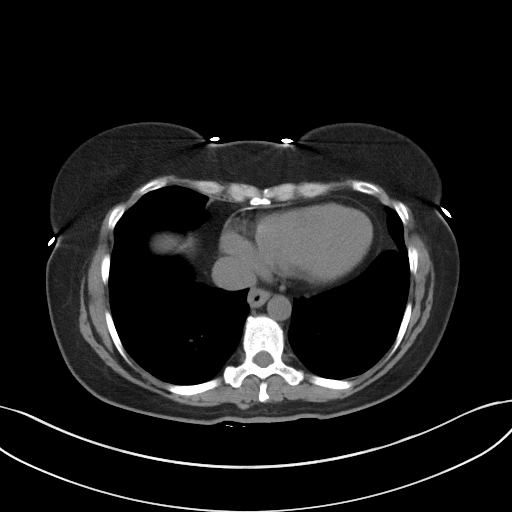

[Series 5: coronal · coronal · 0.71mm/px · 3 of 148 slices shown]
[im 50/148  soft-tissue]
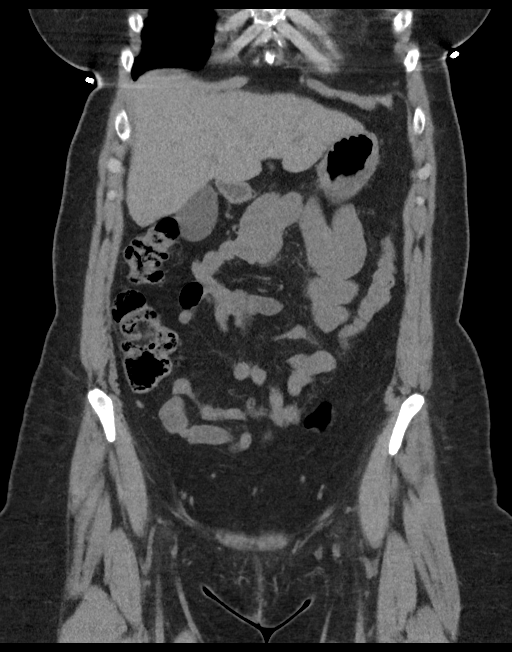
[im 66/148  soft-tissue]
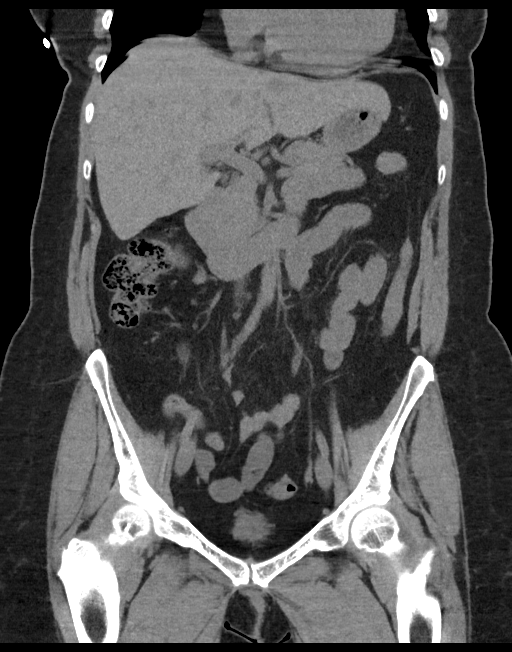
[im 82/148  soft-tissue]
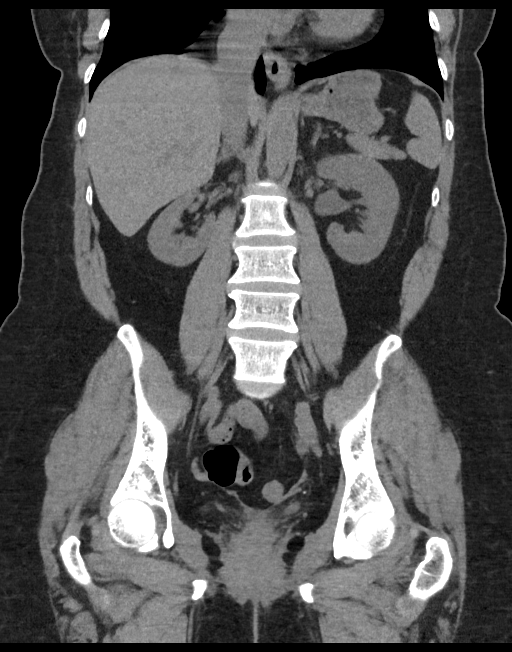

[16 of 46 positions shown; findings below may reference images not displayed]

FINDINGS: Lower chest: The lung bases are clear. Trace pericardial
calcification anteriorly. The heart is normal in size.

Hepatobiliary: No focal liver abnormality is seen. No gallstones,
gallbladder wall thickening, or biliary dilatation.

Pancreas: No ductal dilatation or inflammation.

Spleen: Normal in size without focal abnormality.

Adrenals/Urinary Tract: Normal adrenal glands. Obstructing 2 mm
stone at the left ureterovesicular junction with mild to moderate
hydroureteronephrosis. Mild left perinephric edema. No additional
stones in either kidney. No right hydronephrosis. Right ureter is
decompressed. Urinary bladder is nondistended.

Stomach/Bowel: Stomach is within normal limits. Appendix appears
normal. No evidence of bowel wall thickening, distention, or
inflammatory changes.

Vascular/Lymphatic: Abdominal aorta is normal in caliber. No
abdominopelvic adenopathy.

Reproductive: Status post hysterectomy. No adnexal masses.

Other: No free air, free fluid, or intra-abdominal fluid collection.
Tiny fat containing umbilical hernia.

Musculoskeletal: There are no acute or suspicious osseous
abnormalities.
IMPRESSION: 1. Obstructing 2 mm stone at the left ureterovesicular junction with
mild to moderate hydroureteronephrosis.
2. Minimal pericardial calcifications adjacent to the right heart,
may be sequela of prior infection/inflammation or seen in the
setting of calcific pericarditis.

## 2017-03-11 MED ORDER — LIDOCAINE HCL (CARDIAC) 20 MG/ML IV SOLN
1.5000 mg/kg | INTRAVENOUS | Status: AC
  Administered 2017-03-11: 115.6 mg via INTRAVENOUS
  Filled 2017-03-11: qty 10

## 2017-03-11 MED ORDER — TAMSULOSIN HCL 0.4 MG PO CAPS: ORAL_CAPSULE | ORAL | 0 refills | Status: DC

## 2017-03-11 MED ORDER — HYDROCODONE-ACETAMINOPHEN 5-325 MG PO TABS
2.0000 | ORAL_TABLET | Freq: Once | ORAL | Status: AC
  Administered 2017-03-11: 2 via ORAL
  Filled 2017-03-11: qty 2

## 2017-03-11 MED ORDER — ONDANSETRON 4 MG PO TBDP: ORAL_TABLET | ORAL | 0 refills | Status: DC

## 2017-03-11 MED ORDER — MORPHINE SULFATE (PF) 4 MG/ML IV SOLN
4.0000 mg | Freq: Once | INTRAVENOUS | Status: AC
  Administered 2017-03-11: 4 mg via INTRAVENOUS
  Filled 2017-03-11: qty 1

## 2017-03-11 MED ORDER — DOCUSATE SODIUM 100 MG PO CAPS: ORAL_CAPSULE | ORAL | 0 refills | Status: DC

## 2017-03-11 MED ORDER — OXYCODONE-ACETAMINOPHEN 5-325 MG PO TABS: 1.0000 | ORAL_TABLET | Freq: Four times a day (QID) | ORAL | 0 refills | Status: DC | PRN

## 2017-03-11 NOTE — Discharge Instructions (Signed)
You have been seen in the Emergency Department (ED) today for pain that we believe based on your workup, is caused by kidney stones.  As we have discussed, please drink plenty of fluids.  Please make a follow up appointment with the physician(s) listed elsewhere in this documentation. ° °You may take pain medication as needed but ONLY as prescribed.  Please also take your prescribed Flomax daily.  We also recommend that you take over-the-counter ibuprofen regularly according to label instructions over the next 5 days.  Take it with meals to minimize stomach discomfort. ° °Please see your doctor as soon as possible as stones may take 1-3 weeks to pass and you may require additional care or medications. ° °Do not drink alcohol, drive or participate in any other potentially dangerous activities while taking opiate pain medication as it may make you sleepy. Do not take this medication with any other sedating medications, either prescription or over-the-counter. If you were prescribed Percocet or Vicodin, do not take these with acetaminophen (Tylenol) as it is already contained within these medications. °  °Take Percocet as needed for severe pain.  This medication is an opiate (or narcotic) pain medication and can be habit forming.  Use it as little as possible to achieve adequate pain control.  Do not use or use it with extreme caution if you have a history of opiate abuse or dependence.  If you are on a pain contract with your primary care doctor or a pain specialist, be sure to let them know you were prescribed this medication today from the [facility].  This medication is intended for your use only - do not give any to anyone else and keep it in a secure place where nobody else, especially children, have access to it.  It will also cause or worsen constipation, so you may want to consider taking an over-the-counter stool softener while you are taking this medication. ° °Return to the  Emergency Department (ED) or call your doctor if you have any worsening pain, fever, painful urination, are unable to urinate, or develop other symptoms that concern you. ° °

## 2017-03-11 NOTE — ED Provider Notes (Signed)
[facility] Emergency Department Provider Note  ____________________________________________   First MD Initiated Contact with Patient 03/10/17 2327     (approximate)  I have reviewed the triage vital signs and the nursing notes.   HISTORY  Chief Complaint Flank Pain    HPI Renee Stephens is a 60 y.o. female who according to the EMR has a history of pan sensitive Escherichia coli urinary tract infection who presents by private vehicle for evaluation of acute onset severe left flank pain.  She reports that she has been treated over the last week with Macrodantin for a presumed UTI based on the fact that she has had persistent dysuria and urinary urgency over the last week.  An urgent care gave her the Macrodantin empirically.  Tonight, about 2 hours prior to arrival, she developed acute onset severe sharp stabbing pain in her left flank that radiates to her left lower abdomen.  It has been constant and severe, 10 out of 10, nothing makes it better or worse.  She cannot find a position of comfort.  She has had 2 episodes of vomiting and persistent nausea.  She denies fever/chills, chest pain, shortness of breath.  She reports that she has continued having the dysuria and urinary urgency and a feeling of incomplete bladder emptying over the course of the week that she has been taking the Macrodantin.  She has no history of kidney stones.  No past medical history on file.  There are no active problems to display for this patient.   Past Surgical History:  Procedure Laterality Date  . ABDOMINAL HYSTERECTOMY      Prior to Admission medications   Medication Sig Start Date End Date Taking? Authorizing Provider  docusate sodium (COLACE) 100 MG capsule Take 1 tablet once or twice daily as needed for constipation while taking narcotic pain medicine 03/11/17   Hinda Kehr, MD  ondansetron (ZOFRAN ODT) 4 MG disintegrating tablet Allow 1-2 tablets to dissolve in  your mouth every 8 hours as needed for nausea/vomiting 03/11/17   Hinda Kehr, MD  oxyCODONE-acetaminophen (ROXICET) 5-325 MG tablet Take 1-2 tablets by mouth every 6 (six) hours as needed for severe pain. 03/11/17   Hinda Kehr, MD  tamsulosin (FLOMAX) 0.4 MG CAPS capsule Take 1 tablet by mouth daily until you pass the kidney stone or no longer have symptoms 03/11/17   Hinda Kehr, MD    Allergies Patient has no known allergies.  Family History  Problem Relation Age of Onset  . Breast cancer Neg Hx     Social History Social History  Substance Use Topics  . Smoking status: Never Smoker  . Smokeless tobacco: Never Used  . Alcohol use No    Review of Systems Constitutional: No fever/chills Eyes: No visual changes. ENT: No sore throat. Cardiovascular: Denies chest pain. Respiratory: Denies shortness of breath. Gastrointestinal: Severe onset left flank pain radiating to her left lower abdomen with 2 episodes of emesis and persistent nausea Genitourinary: Negative for dysuria. Musculoskeletal: Negative for neck pain.  Left flank pain. Integumentary: Negative for rash. Neurological: Negative for headaches, focal weakness or numbness.   ____________________________________________   PHYSICAL EXAM:  VITAL SIGNS: ED Triage Vitals  Enc Vitals Group     BP 03/10/17 2239 133/63     Pulse Rate 03/10/17 2239 86     Resp 03/10/17 2239 20     Temp 03/10/17 2239 97.6 F (36.4 C)     Temp Source 03/10/17 2239 Oral  SpO2 03/10/17 2345 99 %     Weight 03/10/17 2238 77.1 kg (170 lb)     Height 03/10/17 2238 1.676 m (5\' 6" )     Head Circumference --      Peak Flow --      Pain Score 03/10/17 2238 9     Pain Loc --      Pain Edu? --      Excl. in [place]? --     Constitutional: Alert and oriented. Generally well-appearing but is in moderate to severe distress and cannot remain still in the exam bed Eyes: Conjunctivae are normal.  Head: Atraumatic. Cardiovascular: Normal rate,  regular rhythm. Good peripheral circulation. Grossly normal heart sounds. Respiratory: Normal respiratory effort.  No retractions. Lungs CTAB. Gastrointestinal: Soft.  Mild tenderness to palpation of the left side of her abdomen but severe left CVA tenderness.  No right-sided abdominal tenderness.  No right-sided CVA tenderness. Musculoskeletal: No lower extremity tenderness nor edema. No gross deformities of extremities. Neurologic:  Normal speech and language. No gross focal neurologic deficits are appreciated.  Skin:  Skin is warm, dry and intact. No rash noted.   ____________________________________________   LABS (all labs ordered are listed, but only abnormal results are displayed)  Labs Reviewed  URINALYSIS, COMPLETE (UACMP) WITH MICROSCOPIC - Abnormal; Notable for the following:       Result Value   Color, Urine AMBER (*)    APPearance CLEAR (*)    Hgb urine dipstick MODERATE (*)    Ketones, ur 20 (*)    Squamous Epithelial / LPF 0-5 (*)    All other components within normal limits  CBC WITH DIFFERENTIAL/PLATELET - Abnormal; Notable for the following:    RDW 14.7 (*)    All other components within normal limits  COMPREHENSIVE METABOLIC PANEL - Abnormal; Notable for the following:    Glucose, Bld 115 (*)    BUN 26 (*)    All other components within normal limits  URINE CULTURE  LIPASE, BLOOD   ____________________________________________  EKG  None - EKG not ordered by ED physician ____________________________________________  RADIOLOGY   Ct Renal Stone Study  Result Date: 03/11/2017 CLINICAL DATA:  Left flank pain and nausea.  Dysuria. EXAM: CT ABDOMEN AND PELVIS WITHOUT CONTRAST TECHNIQUE: Multidetector CT imaging of the abdomen and pelvis was performed following the standard protocol without IV contrast. COMPARISON:  None. FINDINGS: Lower chest: The lung bases are clear. Trace pericardial calcification anteriorly. The heart is normal in size. Hepatobiliary: No  focal liver abnormality is seen. No gallstones, gallbladder wall thickening, or biliary dilatation. Pancreas: No ductal dilatation or inflammation. Spleen: Normal in size without focal abnormality. Adrenals/Urinary Tract: Normal adrenal glands. Obstructing 2 mm stone at the left ureterovesicular junction with mild to moderate hydroureteronephrosis. Mild left perinephric edema. No additional stones in either kidney. No right hydronephrosis. Right ureter is decompressed. Urinary bladder is nondistended. Stomach/Bowel: Stomach is within normal limits. Appendix appears normal. No evidence of bowel wall thickening, distention, or inflammatory changes. Vascular/Lymphatic: Abdominal aorta is normal in caliber. No abdominopelvic adenopathy. Reproductive: Status post hysterectomy. No adnexal masses. Other: No free air, free fluid, or intra-abdominal fluid collection. Tiny fat containing umbilical hernia. Musculoskeletal: There are no acute or suspicious osseous abnormalities. IMPRESSION: 1. Obstructing 2 mm stone at the left ureterovesicular junction with mild to moderate hydroureteronephrosis. 2. Minimal pericardial calcifications adjacent to the right heart, may be sequela of prior infection/inflammation or seen in the setting of calcific pericarditis. Electronically Signed   By:  Jeb Levering M.D.   On: 03/11/2017 00:33    ____________________________________________   PROCEDURES  Critical Care performed: No   Procedure(s) performed:   Procedures   ____________________________________________   INITIAL IMPRESSION / ASSESSMENT AND PLAN / ED COURSE  Pertinent labs & imaging results that were available during my care of the patient were reviewed by me and considered in my medical decision making (see chart for details).  The patient may have pyelonephritis but she is afebrile and not tachycardic.  We are awaiting a urine sample but based on her symptoms I suspect that she has a ureteral stone.  I  will attempt to control her pain with Toradol and morphine and will obtain a CT renal stone protocol.   Clinical Course as of Mar 11 346  Thu Mar 11, 2017  0037 2-mm stone, consistent with her symptoms. Minimal pericardial calcifications adjacent to the right heart, non-specific and does not clinically correlate to pericarditis.  Will discuss with patient and assess improvement in pain CT Renal Laren Everts [CF]  [phone] Patient is feeling better now after morphine and Toradol but is still hurting and rates her pain at about a 6 out of 10.  I am going to give her another dose of morphine but will also order lidocaine IV for renal colic (1.5 mg/kg over 15 minutes in 250 mL of NS).  Discussed all of this with patient and husband.  [CF]  0102 Urinalysis is still pending.  [CF]  0302 No evidence of acute infection on UA, but will send culture given recent antibiotic treatment.  [CF]  [phone] Patient states she feels tremendously better and is ready to go home.  She states that the lidocaine took away all the rest of her pain.  We had our usual and customary discussion about renal colic and I gave my usual return precautions.  She understands and agrees with the plan.  [CF]  5638 I reviewed the patient's prescription history over the last 12 months in the multi-state controlled substances database(s) that includes [place], [place], [place], [place], [place], [place], [place], [place], [place], [place], [place], [place], [place], and [place].  Results were notable for only a prescription of cough syrup about 9 months ago.  [CF]    Clinical Course User Index [CF] Hinda Kehr, MD    ____________________________________________  FINAL CLINICAL IMPRESSION(S) / ED DIAGNOSES  Final diagnoses:  Ureteral stone with hydronephrosis  Renal colic on left side     MEDICATIONS GIVEN DURING THIS VISIT:  Medications  morphine 4 MG/ML injection 4 mg (4 mg Intravenous Given  03/10/17 2342)  ondansetron (ZOFRAN) injection 4 mg (4 mg Intravenous Given 03/10/17 2342)  ketorolac (TORADOL) 30 MG/ML injection 15 mg (15 mg Intravenous Given 03/11/17 0000)  morphine 4 MG/ML injection 4 mg (4 mg Intravenous Given 03/11/17 0118)  lidocaine (cardiac) 100 mg/52ml (XYLOCAINE) 20 MG/ML injection 2% 115.6 mg (115.6 mg Intravenous Given 03/11/17 0120)     NEW OUTPATIENT MEDICATIONS STARTED DURING THIS VISIT:  New Prescriptions   DOCUSATE SODIUM (COLACE) 100 MG CAPSULE    Take 1 tablet once or twice daily as needed for constipation while taking narcotic pain medicine   ONDANSETRON (ZOFRAN ODT) 4 MG DISINTEGRATING TABLET    Allow 1-2 tablets to dissolve in your mouth every 8 hours as needed for nausea/vomiting   OXYCODONE-ACETAMINOPHEN (ROXICET) 5-325 MG TABLET    Take 1-2 tablets by mouth every 6 (six) hours as needed for severe pain.   TAMSULOSIN (  FLOMAX) 0.4 MG CAPS CAPSULE    Take 1 tablet by mouth daily until you pass the kidney stone or no longer have symptoms    Modified Medications   No medications on file    Discontinued Medications   No medications on file     Note:  This document was prepared using Dragon voice recognition software and may include unintentional dictation errors.    Hinda Kehr, MD 03/11/17 [phone]

## 2017-03-12 LAB — URINE CULTURE: SPECIAL REQUESTS: NORMAL

## 2017-03-24 ENCOUNTER — Ambulatory Visit (INDEPENDENT_AMBULATORY_CARE_PROVIDER_SITE_OTHER): Payer: Commercial Managed Care - PPO | Admitting: Urology

## 2017-03-24 ENCOUNTER — Encounter: Payer: Self-pay | Admitting: Urology

## 2017-03-24 ENCOUNTER — Ambulatory Visit
Admission: RE | Admit: 2017-03-24 | Discharge: 2017-03-24 | Disposition: A | Discharge status: Home / Self Care | Payer: Commercial Managed Care - PPO | Source: Ambulatory Visit | Attending: Internal Medicine | Admitting: Internal Medicine

## 2017-03-24 VITALS — BP 117/74 | HR 92 | Ht 66.0 in | Wt 169.9 lb

## 2017-03-24 DIAGNOSIS — N200 Calculus of kidney: Secondary | ICD-10-CM | POA: Diagnosis not present

## 2017-03-24 DIAGNOSIS — Z1231 Encounter for screening mammogram for malignant neoplasm of breast: Secondary | ICD-10-CM | POA: Diagnosis present

## 2017-03-24 LAB — URINALYSIS, COMPLETE
BILIRUBIN UA: NEGATIVE
GLUCOSE, UA: NEGATIVE
Ketones, UA: NEGATIVE
Nitrite, UA: NEGATIVE
RBC, UA: NEGATIVE
SPEC GRAV UA: 1.02 (ref 1.005–1.030)
Urobilinogen, Ur: 1 mg/dL (ref 0.2–1.0)
pH, UA: 7 (ref 5.0–7.5)

## 2017-03-24 IMAGING — MG MM DIGITAL SCREENING BILAT W/ TOMO W/ CAD
8 of 12 series · 8 of 28 positions shown · non-contrast
Comparison: Previous exam(s).

CLINICAL DATA: Screening.

EXAM:
2D DIGITAL SCREENING BILATERAL MAMMOGRAM WITH CAD AND ADJUNCT TOMO

[L CC synth-2D]
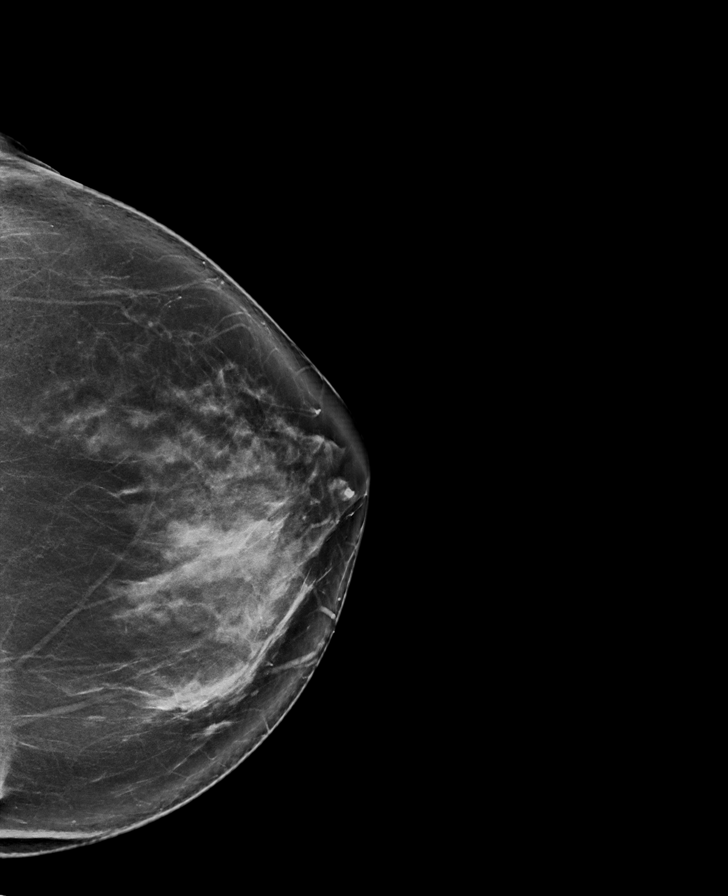

[R CC]
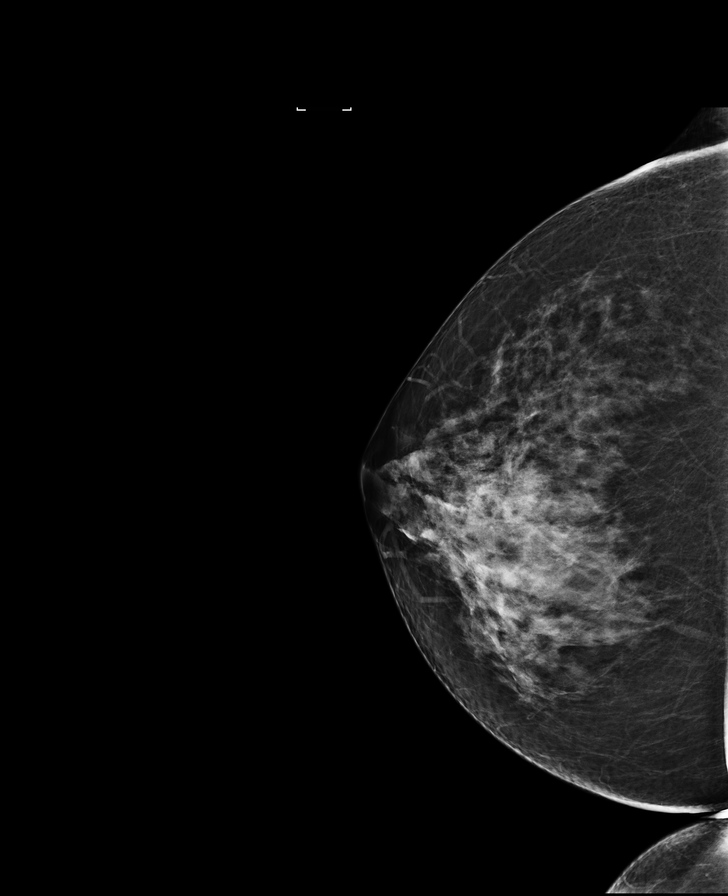

[R MLO synth-2D]
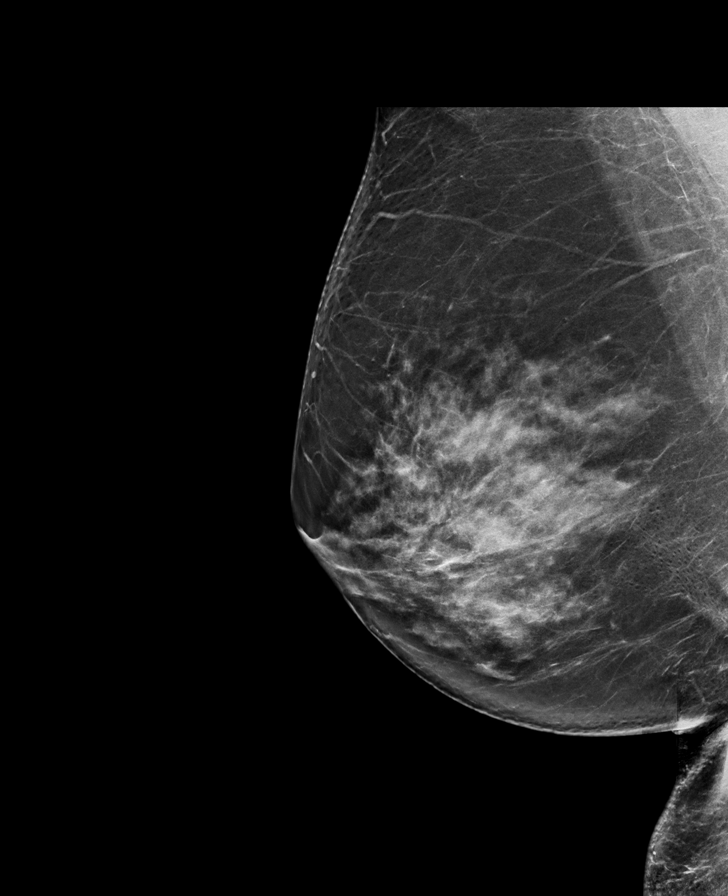

[L MLO]
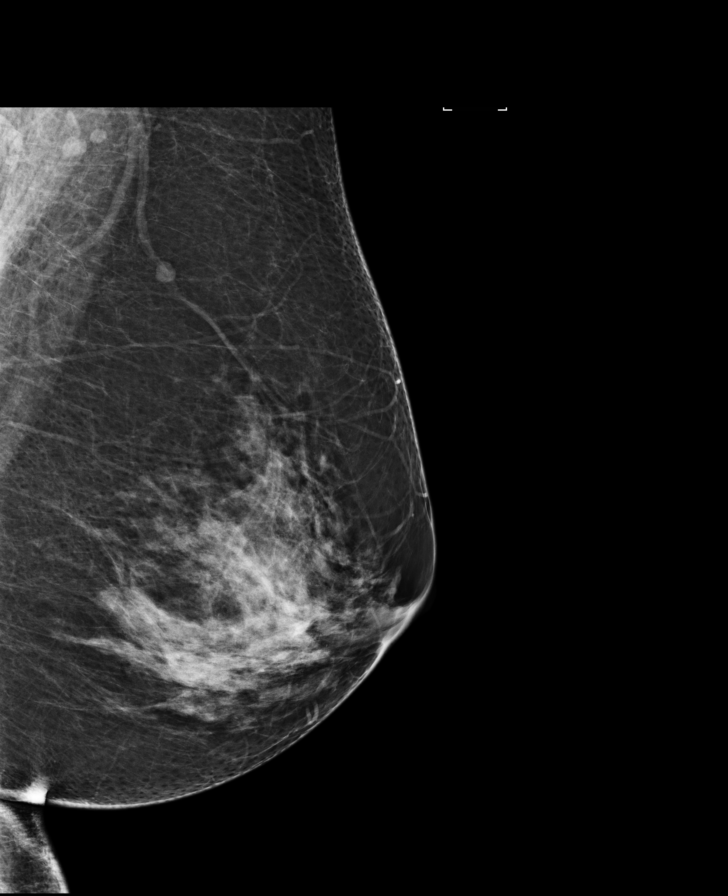

[L CC]
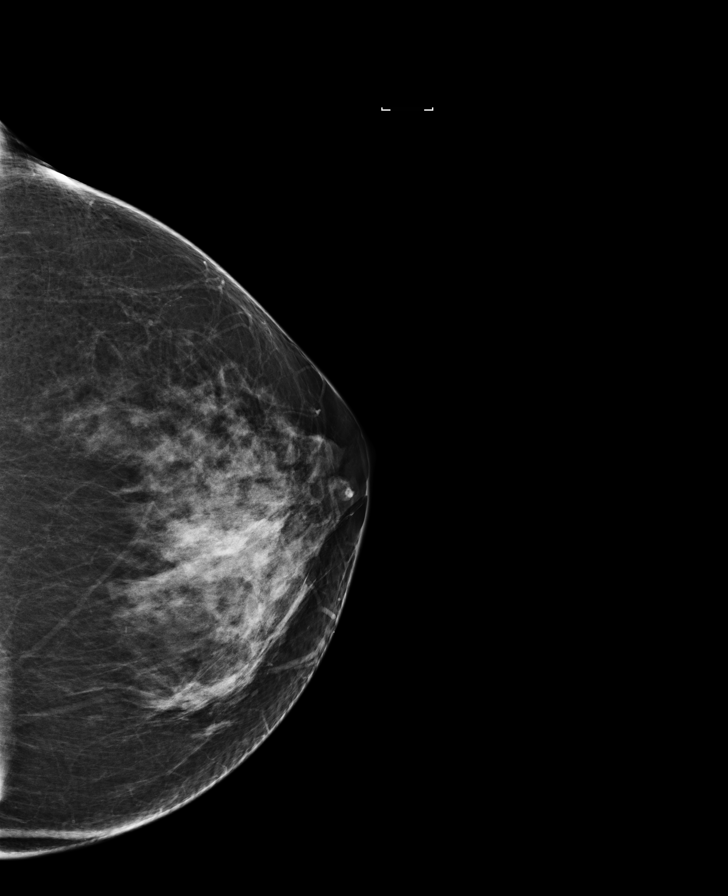

[R CC synth-2D]
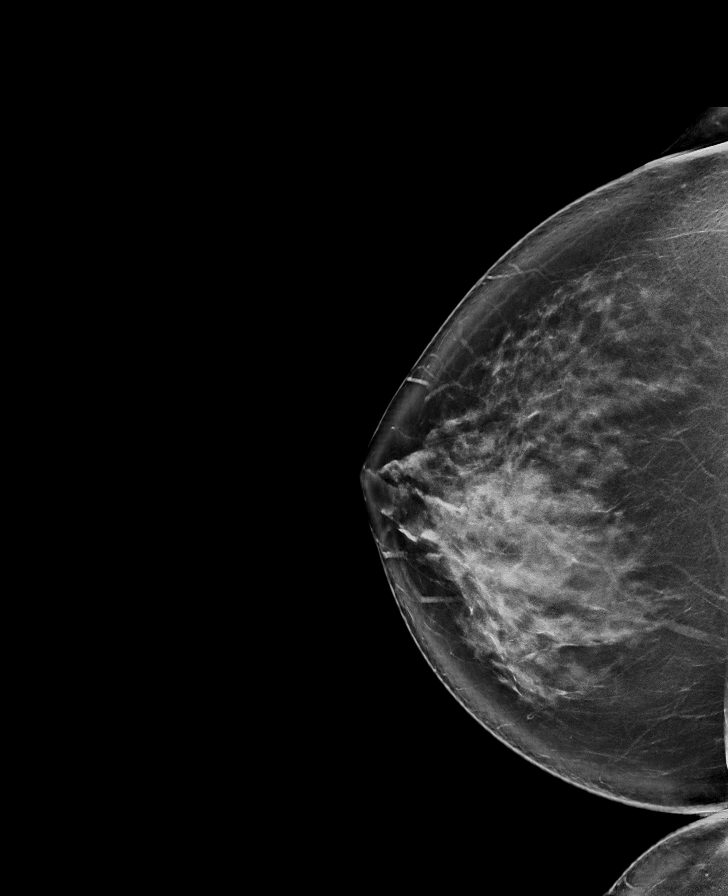

[L MLO synth-2D]
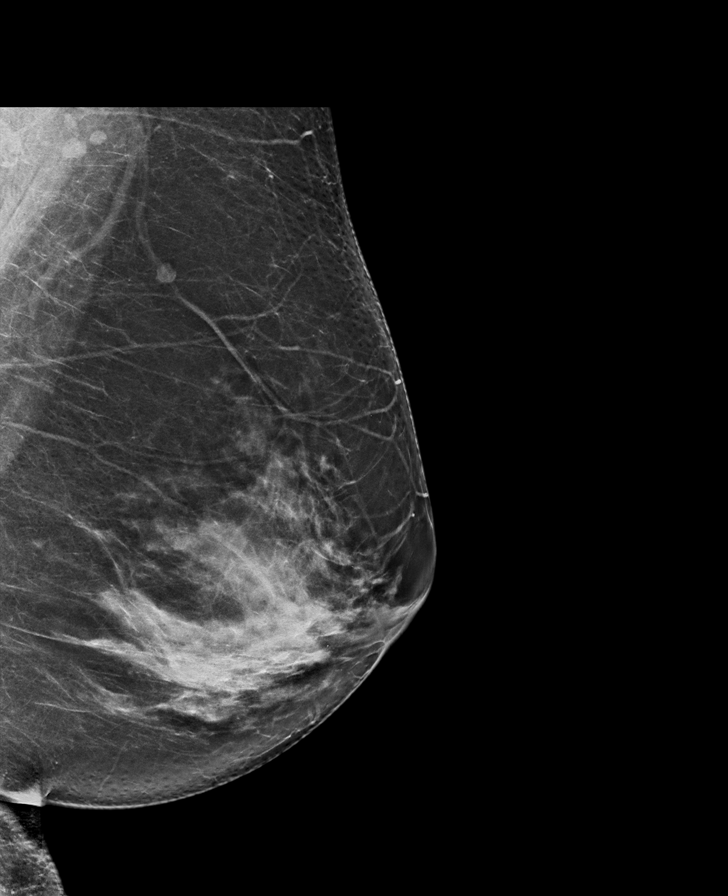

[R MLO]
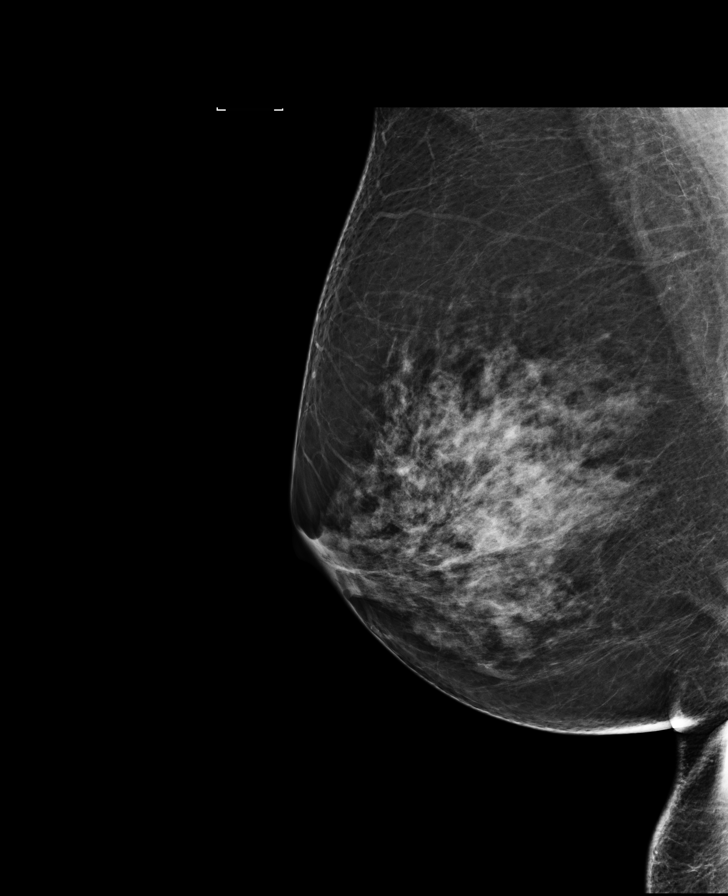

[8 of 28 positions shown; findings below may reference images not displayed]

ACR Breast Density Category c: The breast tissue is heterogeneously
dense, which may obscure small masses.
FINDINGS: There are no findings suspicious for malignancy. Images were
processed with CAD.
IMPRESSION: No mammographic evidence of malignancy. A result letter of this
screening mammogram will be mailed directly to the patient.

RECOMMENDATION:
Screening mammogram in one year. (Code:[TA])

BI-RADS CATEGORY  1: Negative.

## 2017-03-24 MED ORDER — TAMSULOSIN HCL 0.4 MG PO CAPS: 0.4000 mg | ORAL_CAPSULE | Freq: Every day | ORAL | 0 refills | Status: DC

## 2017-03-24 NOTE — Progress Notes (Signed)
03/24/2017 3:32 PM   Renee Stephens 1956-03-22 [id number]  Referring provider: Katheren Shams [address] [place], [place] [address]  Chief Complaint  Patient presents with  . Nephrolithiasis    HPI: The patient is a 61 year old female who presents today after being diagnosed with a 2 mm left UVJ stone with hydronephrosis in the emergency department on 03/11/2017.  On this stage, she experienced significant pain requiring her trip to the emergency department. Here she was diagnosed with after mentions stone with no other evidence of nephrolithiasis. She has been doing well until yesterday where she again experiences left flank and radiating towards her groin that does not cross midline. She also had significant urinary frequency since she was seen in the ED. She is taking tamsulosin. She is only used her Percocet for an acute episode yesterday but not any other times recently. She has not been straining her urine.  She has never had stone prior to this time.   PMH: No past medical history on file.  Surgical History: Past Surgical History:  Procedure Laterality Date  . ABDOMINAL HYSTERECTOMY  1995    Home Medications:  Allergies as of 03/24/2017      Reactions   Penicillin G Rash, Swelling      Medication List       Accurate as of 03/24/17  3:32 PM. Always use your most recent med list.          ondansetron 4 MG disintegrating tablet Commonly known as:  ZOFRAN ODT Allow 1-2 tablets to dissolve in your mouth every 8 hours as needed for nausea/vomiting   oxyCODONE-acetaminophen 5-325 MG tablet Commonly known as:  ROXICET Take 1-2 tablets by mouth every 6 (six) hours as needed for severe pain.   pramipexole 0.25 MG tablet Commonly known as:  MIRAPEX Take 0.25 mg by mouth 3 (three) times daily.   STOOL SOFTENER 100 MG capsule Generic drug:  docusate sodium Take 100 mg by mouth 2 (two) times daily.   tamsulosin 0.4 MG Caps  capsule Commonly known as:  FLOMAX Take 1 tablet by mouth daily until you pass the kidney stone or no longer have symptoms   tamsulosin 0.4 MG Caps capsule Commonly known as:  FLOMAX Take 1 capsule (0.4 mg total) by mouth daily.       Allergies:  Allergies  Allergen Reactions  . Penicillin G Rash and Swelling    Family History: Family History  Problem Relation Age of Onset  . Kidney failure Father   . Kidney failure Mother   . Breast cancer Neg Hx     Social History:  reports that she has never smoked. She has never used smokeless tobacco. She reports that she does not drink alcohol or use drugs.  ROS: UROLOGY Frequent Urination?: Yes Hard to postpone urination?: Yes Burning/pain with urination?: Yes Get up at night to urinate?: Yes Leakage of urine?: No Urine stream starts and stops?: No Trouble starting stream?: No Do you have to strain to urinate?: No Blood in urine?: No Urinary tract infection?: No Sexually transmitted disease?: No Injury to kidneys or bladder?: No Painful intercourse?: No Weak stream?: No Currently pregnant?: No Vaginal bleeding?: No Last menstrual period?: n  Gastrointestinal Nausea?: No Vomiting?: No Indigestion/heartburn?: No Diarrhea?: No Constipation?: No  Constitutional Fever: No Night sweats?: No Weight loss?: No Fatigue?: No  Skin Skin rash/lesions?: No Itching?: No  Eyes Blurred vision?: No Double vision?: No  Ears/Nose/Throat Sore throat?: No Sinus problems?: No  Hematologic/Lymphatic  Swollen glands?: No Easy bruising?: No  Cardiovascular Leg swelling?: No Chest pain?: No  Respiratory Cough?: No Shortness of breath?: No  Endocrine Excessive thirst?: No  Musculoskeletal Back pain?: No Joint pain?: No  Neurological Headaches?: No Dizziness?: No  Psychologic Depression?: No  Physical Exam: BP 117/74 (BP Location: Left Arm, Patient Position: Sitting, Cuff Size: Large)   Pulse 92   Ht 5\' 6"   (1.676 m)   Wt 169 lb 14.4 oz (77.1 kg)   BMI 27.42 kg/m   Constitutional:  Alert and oriented, No acute distress. HEENT: [place] AT, moist mucus membranes.  Trachea midline, no masses. Cardiovascular: No clubbing, cyanosis, or edema. Respiratory: Normal respiratory effort, no increased work of breathing. GI: Abdomen is soft, nontender, nondistended, no abdominal masses GU: No CVA tenderness.  Skin: No rashes, bruises or suspicious lesions. Lymph: No cervical or inguinal adenopathy. Neurologic: Grossly intact, no focal deficits, moving all 4 extremities. Psychiatric: Normal mood and affect.  Laboratory Data: Lab Results  Component Value Date   WBC 7.8 03/10/2017   HGB 12.6 03/10/2017   HCT 36.5 03/10/2017   MCV 85.1 03/10/2017   PLT 286 03/10/2017    Lab Results  Component Value Date   CREATININE 0.95 03/10/2017    No results found for: PSA  No results found for: TESTOSTERONE  No results found for: HGBA1C  Urinalysis    Component Value Date/Time   COLORURINE AMBER (A) 03/11/2017 0009   APPEARANCEUR CLEAR (A) 03/11/2017 0009   LABSPEC 1.029 03/11/2017 0009   PHURINE 5.0 03/11/2017 0009   GLUCOSEU NEGATIVE 03/11/2017 0009   HGBUR MODERATE (A) 03/11/2017 0009   BILIRUBINUR NEGATIVE 03/11/2017 0009   KETONESUR 20 (A) 03/11/2017 0009   PROTEINUR NEGATIVE 03/11/2017 0009   NITRITE NEGATIVE 03/11/2017 0009   LEUKOCYTESUR NEGATIVE 03/11/2017 0009    Pertinent Imaging: CT reviewed as above  Assessment & Plan:    1. Left 2 mm UVJ stone Based on the patient's symptoms, she still has not passed the stone. For now, she has elected to proceed with medical expulsive therapy with Flomax and pain control as needed. She was given a strainer. If she is unable to pass her stone next 2 weeks, we'll schedule her for a renal ultrasound and KUB prior. We did briefly discuss surgical interventions. I did discuss due to the small size and location that lithotripsy would probably not be  feasible. We did discuss ureteroscopy in great detail. For now, she would like to proceed with medical expulsive therapy.  Return in about 2 weeks (around 04/07/2017) for KUB, renal u/s prior.  Nickie Retort, MD  [facility] [address] [place], [place] [address] [phone]

## 2017-04-06 ENCOUNTER — Ambulatory Visit: Payer: Self-pay

## 2017-04-08 ENCOUNTER — Ambulatory Visit
Admission: RE | Admit: 2017-04-08 | Discharge: 2017-04-08 | Disposition: A | Discharge status: Home / Self Care | Payer: Commercial Managed Care - PPO | Source: Ambulatory Visit | Attending: Urology | Admitting: Urology

## 2017-04-08 DIAGNOSIS — N200 Calculus of kidney: Secondary | ICD-10-CM | POA: Insufficient documentation

## 2017-04-08 IMAGING — CR DG ABDOMEN 1V
1 series · 2 of 2 positions shown · non-contrast
Comparison: [DATE], [DATE]

CLINICAL DATA: Follow-up left renal calculus

EXAM:
ABDOMEN - 1 VIEW

[Series 1: dg abd 1 view · 0.14mm/px · 2 of 2 slices shown]
[im 1/2]
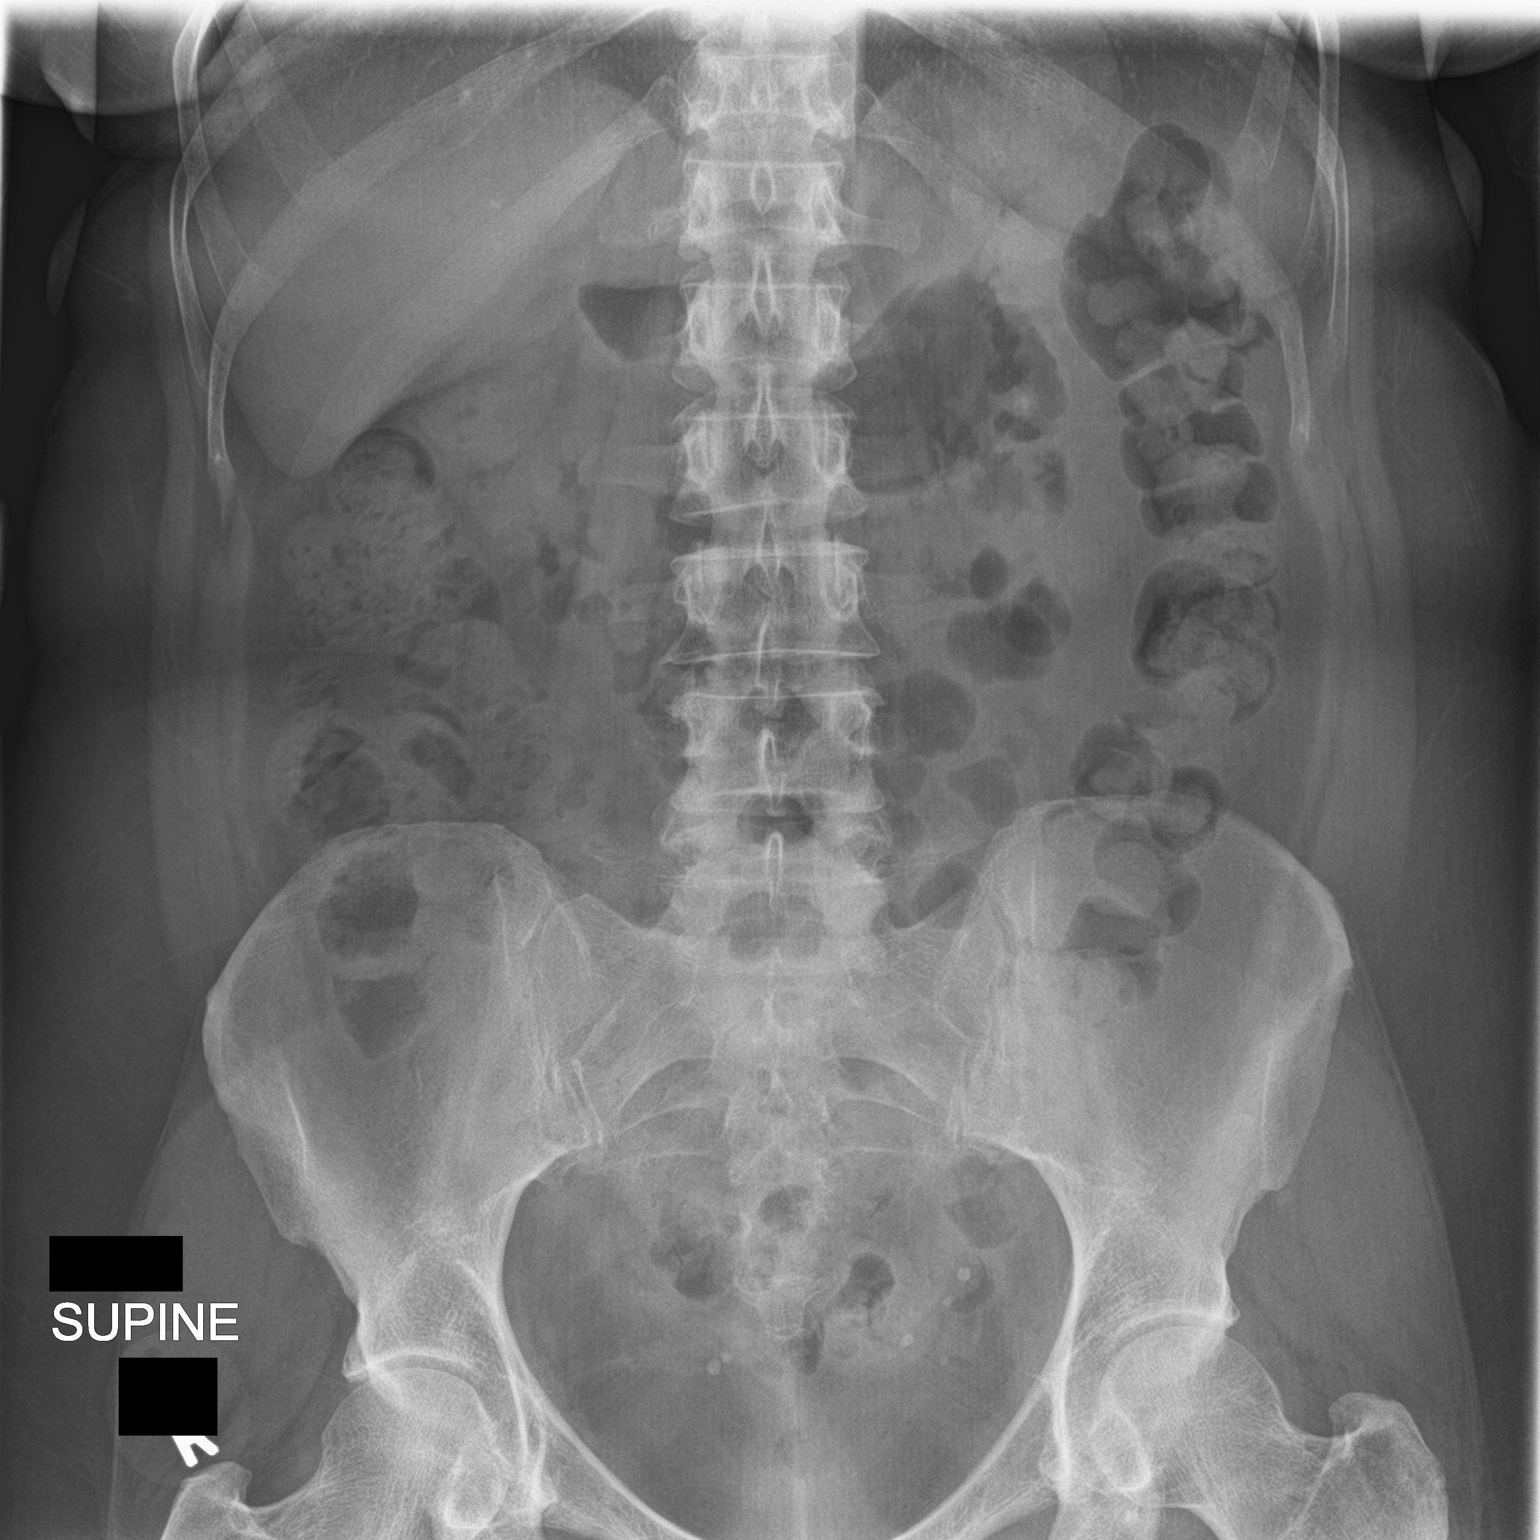
[im 2/2]
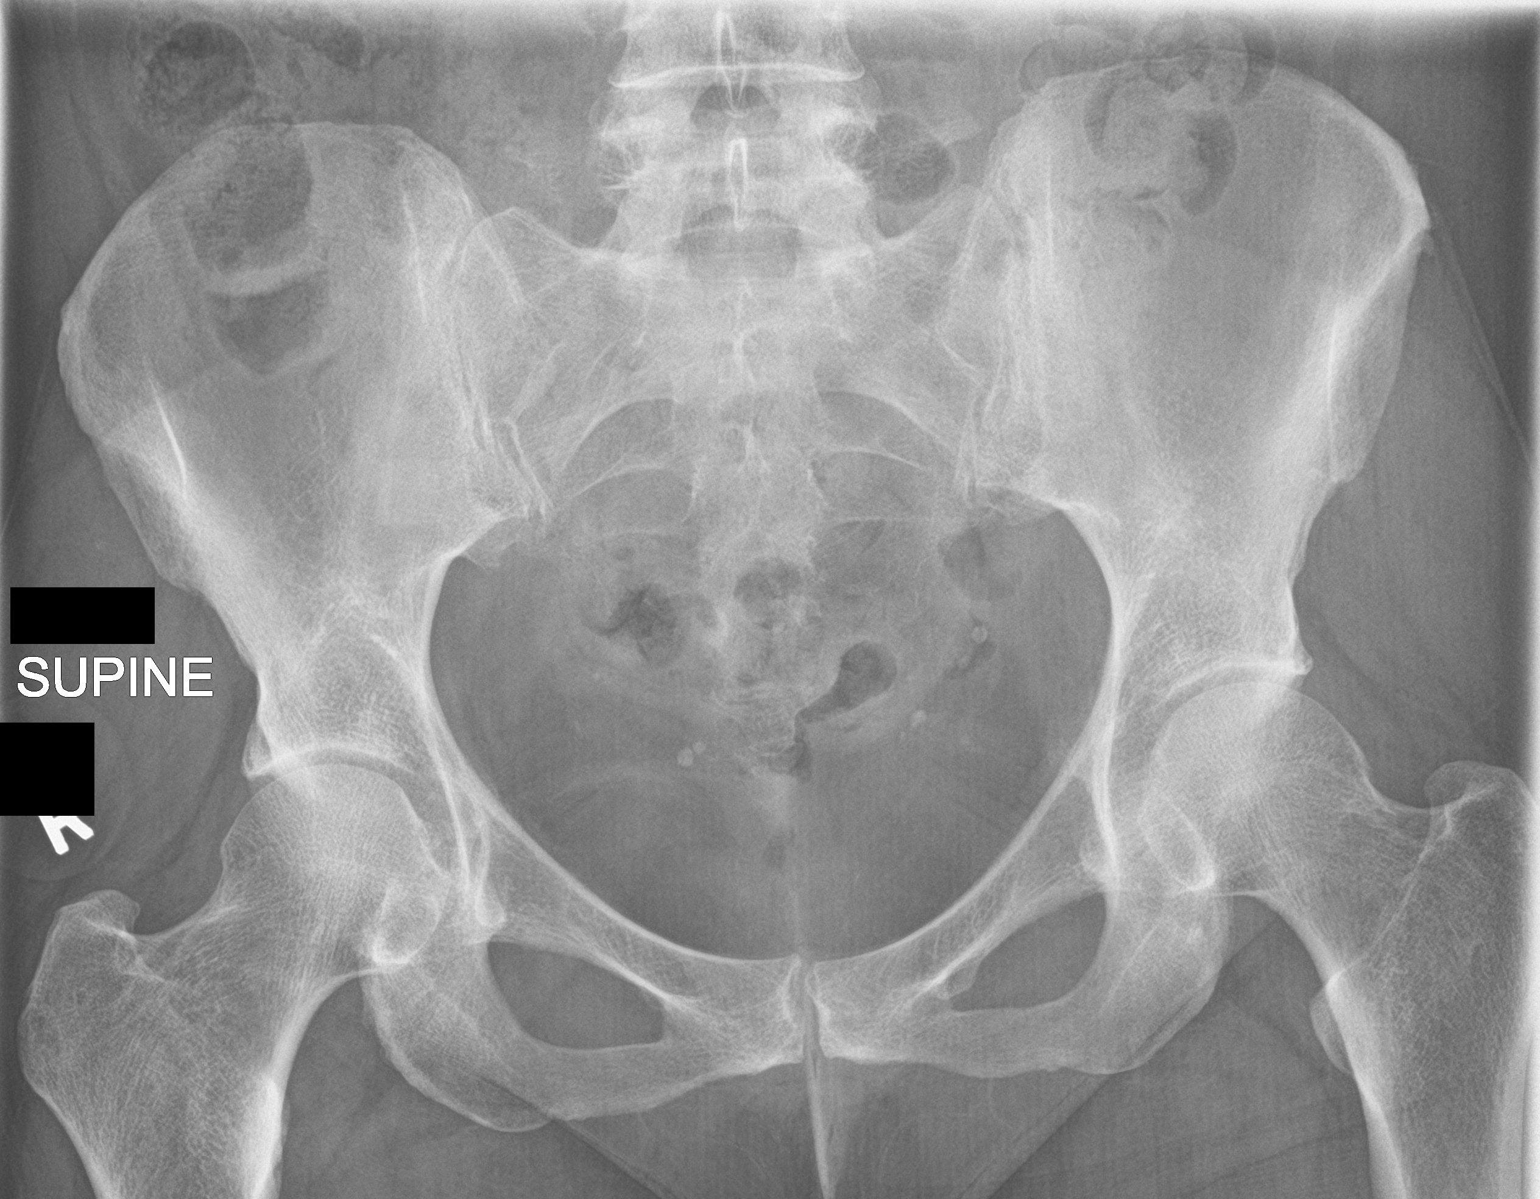

[2 of 2 positions shown; findings below may reference images not displayed]

FINDINGS: Scattered large and small bowel gas is noted. Multiple phleboliths
are noted within the pelvis. No calculi are identified. Multiple
phleboliths are seen within the pelvis. The previously noted distal
left ureteral stone is no longer identified.
IMPRESSION: No calculi seen.

## 2017-04-09 ENCOUNTER — Encounter: Payer: Self-pay | Admitting: Urology

## 2017-04-09 ENCOUNTER — Ambulatory Visit (INDEPENDENT_AMBULATORY_CARE_PROVIDER_SITE_OTHER): Payer: Commercial Managed Care - PPO | Admitting: Urology

## 2017-04-09 VITALS — BP 106/72 | HR 71 | Ht 66.0 in | Wt 168.8 lb

## 2017-04-09 DIAGNOSIS — N200 Calculus of kidney: Secondary | ICD-10-CM

## 2017-04-09 LAB — URINALYSIS, COMPLETE
BILIRUBIN UA: NEGATIVE
GLUCOSE, UA: NEGATIVE
KETONES UA: NEGATIVE
NITRITE UA: NEGATIVE
Protein, UA: NEGATIVE
Specific Gravity, UA: 1.02 (ref 1.005–1.030)
UUROB: 0.2 mg/dL (ref 0.2–1.0)
pH, UA: 5.5 (ref 5.0–7.5)

## 2017-04-09 LAB — MICROSCOPIC EXAMINATION
Bacteria, UA: NONE SEEN
RBC, UA: NONE SEEN /hpf (ref 0–?)
WBC UA: NONE SEEN /HPF (ref 0–?)

## 2017-04-09 NOTE — Progress Notes (Signed)
04/09/2017 8:41 AM   Renee Stephens December 02, 1955 [id number]  Referring provider: Katheren Shams [address] [place],  [address]  Chief Complaint  Patient presents with  . Follow-up    2 week kidney stone    HPI:   Patient was seen for a 2 mm left UVJ stone at the end of July 2018. It sounded like she had passed the stone. There were no other stones in the kidneys on CT. She follows up today for review of a follow-up KUB which was negative and ultrasound which showed resolution of the left hydronephrosis. She's been well without dysuria, gross hematuria or flank pain.   PMH: No past medical history on file.  Surgical History: Past Surgical History:  Procedure Laterality Date  . ABDOMINAL HYSTERECTOMY  1995    Home Medications:  Allergies as of 04/09/2017      Reactions   Penicillin G Rash, Swelling      Medication List       Accurate as of 04/09/17  8:41 AM. Always use your most recent med list.          FLUoxetine 10 MG tablet Commonly known as:  PROZAC Take 10 mg by mouth daily.   ondansetron 4 MG disintegrating tablet Commonly known as:  ZOFRAN ODT Allow 1-2 tablets to dissolve in your mouth every 8 hours as needed for nausea/vomiting   oxyCODONE-acetaminophen 5-325 MG tablet Commonly known as:  ROXICET Take 1-2 tablets by mouth every 6 (six) hours as needed for severe pain.   pramipexole 0.25 MG tablet Commonly known as:  MIRAPEX Take 0.25 mg by mouth 3 (three) times daily.   STOOL SOFTENER 100 MG capsule Generic drug:  docusate sodium Take 100 mg by mouth 2 (two) times daily.   tamsulosin 0.4 MG Caps capsule Commonly known as:  FLOMAX Take 1 tablet by mouth daily until you pass the kidney stone or no longer have symptoms   tamsulosin 0.4 MG Caps capsule Commonly known as:  FLOMAX Take 1 capsule (0.4 mg total) by mouth daily.       Allergies:  Allergies  Allergen Reactions  . Penicillin G Rash and Swelling     Family History: Family History  Problem Relation Age of Onset  . Kidney failure Father   . Kidney failure Mother   . Breast cancer Neg Hx   . Bladder Cancer Neg Hx     Social History:  reports that she has never smoked. She has never used smokeless tobacco. She reports that she does not drink alcohol or use drugs.  ROS: UROLOGY Frequent Urination?: No Hard to postpone urination?: No Burning/pain with urination?: No Get up at night to urinate?: No Leakage of urine?: No Urine stream starts and stops?: No Trouble starting stream?: No Do you have to strain to urinate?: No Blood in urine?: No Urinary tract infection?: No Sexually transmitted disease?: No Injury to kidneys or bladder?: No Painful intercourse?: No Weak stream?: No Currently pregnant?: No Vaginal bleeding?: No Last menstrual period?: n  Gastrointestinal Nausea?: No Vomiting?: No Indigestion/heartburn?: No Diarrhea?: No Constipation?: No  Constitutional Fever: No Night sweats?: No Weight loss?: No Fatigue?: No  Skin Skin rash/lesions?: No Itching?: No  Eyes Blurred vision?: No Double vision?: No  Ears/Nose/Throat Sore throat?: No Sinus problems?: No  Hematologic/Lymphatic Swollen glands?: No Easy bruising?: No  Cardiovascular Leg swelling?: No Chest pain?: No  Respiratory Cough?: No Shortness of breath?: No  Endocrine Excessive thirst?: No  Musculoskeletal Back pain?:  No Joint pain?: No  Neurological Headaches?: No Dizziness?: No  Psychologic Depression?: No Anxiety?: No  Physical Exam: BP 106/72   Pulse 71   Ht 5\' 6"  (1.676 m)   Wt 76.6 kg (168 lb 12.8 oz)   BMI 27.25 kg/m   Constitutional:  Alert and oriented, No acute distress. HEENT: [place] AT, moist mucus membranes.  Trachea midline, no masses. Cardiovascular: No clubbing, cyanosis, or edema. Respiratory: Normal respiratory effort, no increased work of breathing. GI: Abdomen is soft, nontender, nondistended,  no abdominal masses Skin: No rashes, bruises or suspicious lesions. Neurologic: Grossly intact, no focal deficits, moving all 4 extremities. Psychiatric: Normal mood and affect.  Laboratory Data: Lab Results  Component Value Date   WBC 7.8 03/10/2017   HGB 12.6 03/10/2017   HCT 36.5 03/10/2017   MCV 85.1 03/10/2017   PLT 286 03/10/2017    Lab Results  Component Value Date   CREATININE 0.95 03/10/2017    No results found for: PSA  No results found for: TESTOSTERONE  No results found for: HGBA1C  Urinalysis    Component Value Date/Time   COLORURINE AMBER (A) 03/11/2017 0009   APPEARANCEUR Clear 03/24/2017 1459   LABSPEC 1.029 03/11/2017 0009   PHURINE 5.0 03/11/2017 0009   GLUCOSEU Negative 03/24/2017 1459   HGBUR MODERATE (A) 03/11/2017 0009   BILIRUBINUR Negative 03/24/2017 1459   KETONESUR 20 (A) 03/11/2017 0009   PROTEINUR 1+ (A) 03/24/2017 1459   PROTEINUR NEGATIVE 03/11/2017 0009   NITRITE Negative 03/24/2017 1459   NITRITE NEGATIVE 03/11/2017 0009   LEUKOCYTESUR Trace (A) 03/24/2017 1459    Pertinent Imaging: CT, [place] and KUB images reviewed   Assessment & Plan:    1. Kidney stones Resolved. Discussed dietary changes to prevent stones. See PRN.  - Urinalysis, Complete   No Follow-up on file.  Festus Aloe, [facility] [address] [place], [place] [address] [phone]

## 2017-04-18 IMAGING — US US RENAL
1 series · 14 of 25 positions shown · non-contrast
Comparison: [DATE] CT

CLINICAL DATA: 61-year-old female with recent left UVJ calculus and
mild to moderate hydronephrosis. Follow-up.

EXAM:
RENAL / URINARY TRACT ULTRASOUND COMPLETE

[Series 1: us renal · 0.23mm/px · 14 of 40 slices shown]
[im 1/40]
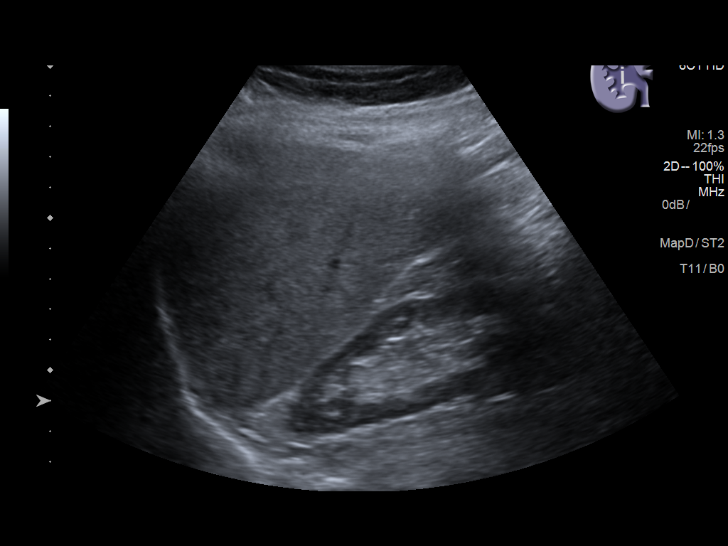
[im 4/40]
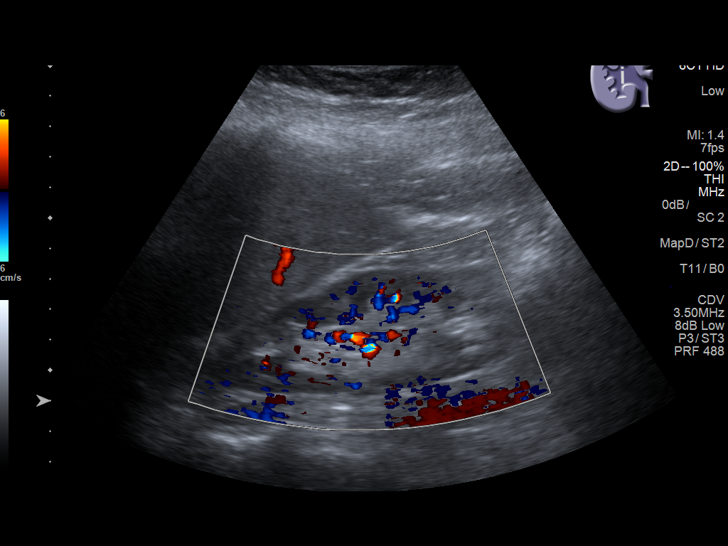
[im 7/40]
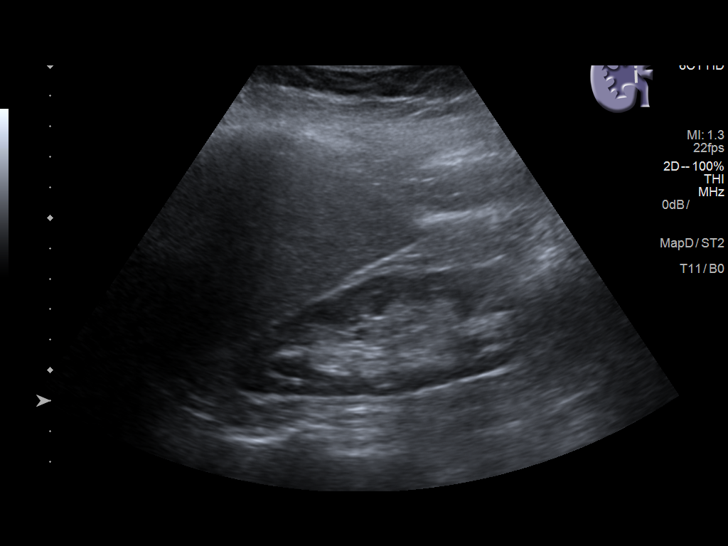
[im 10/40]
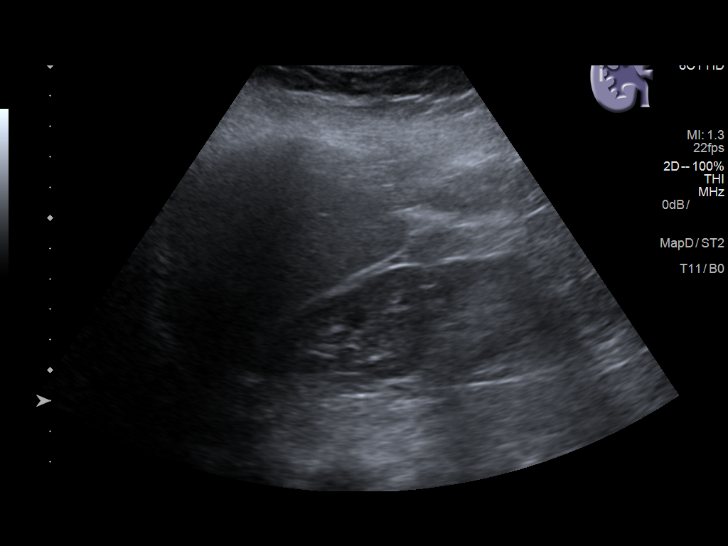
[im 14/40]
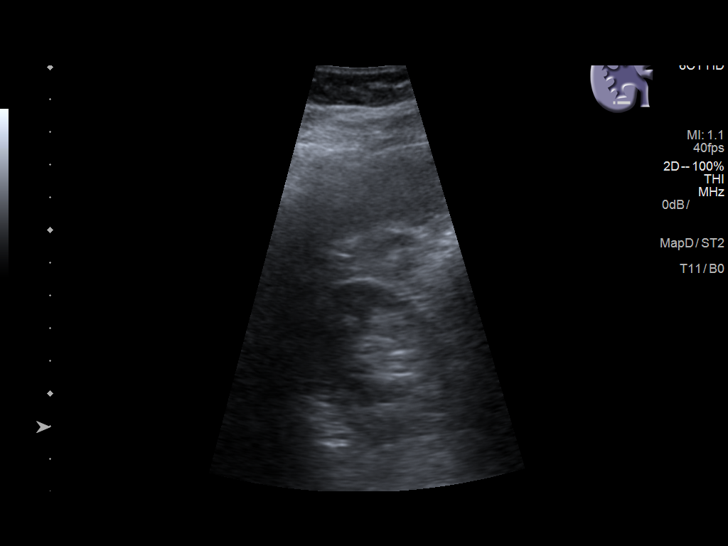
[im 15/40]
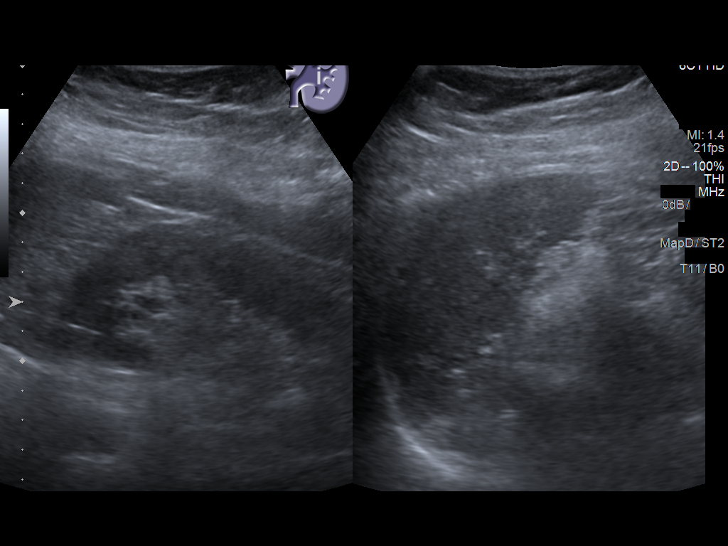
[im 18/40]
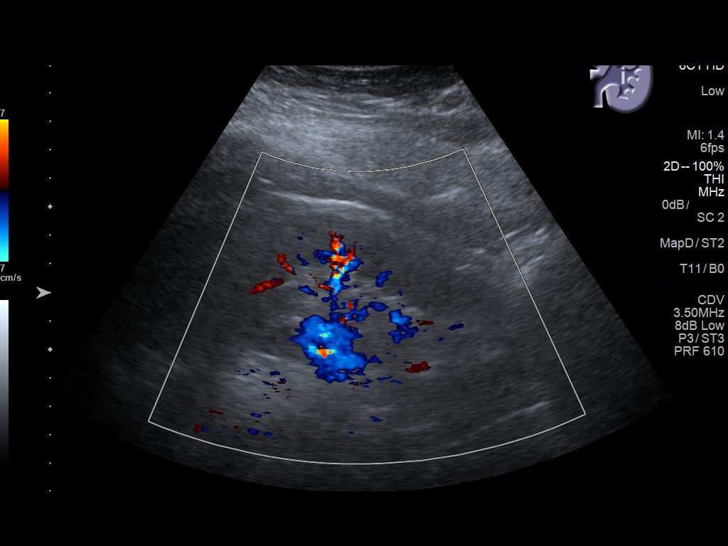
[im 22/40]
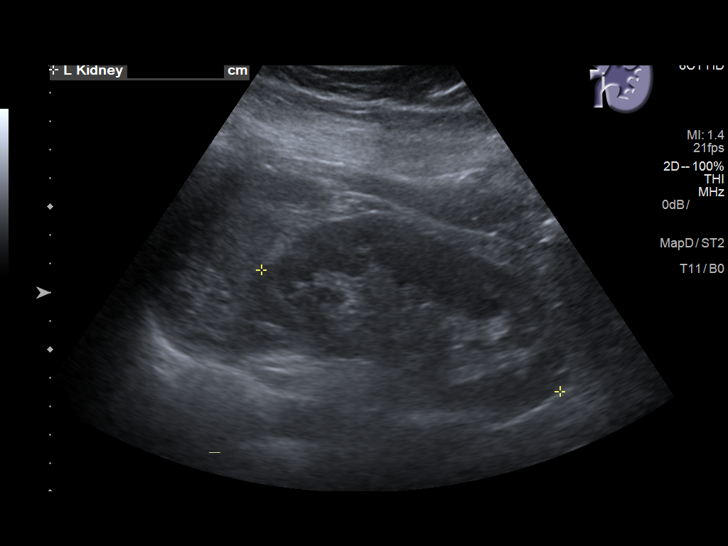
[im 25/40]
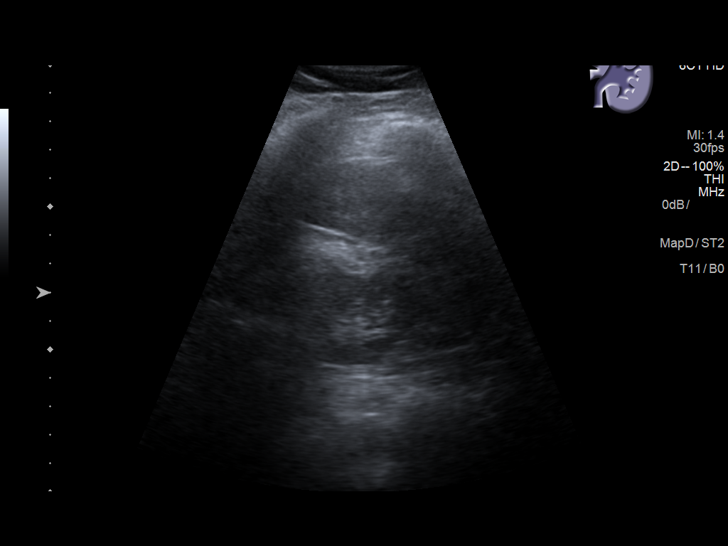
[im 27/40]
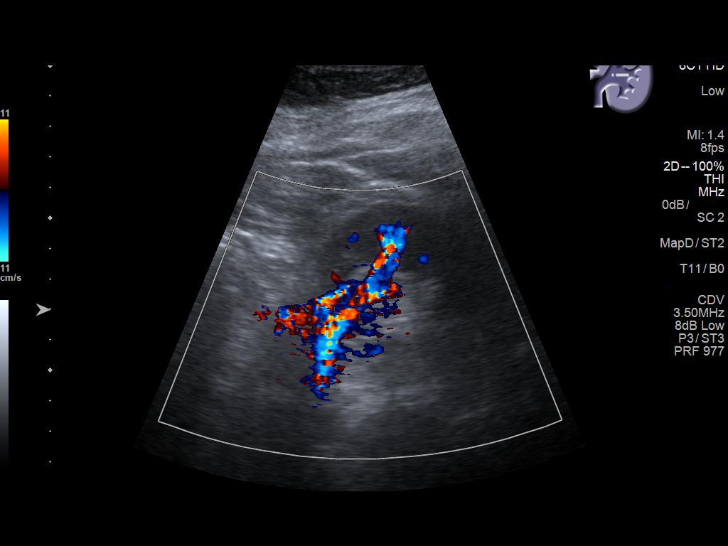
[im 30/40]
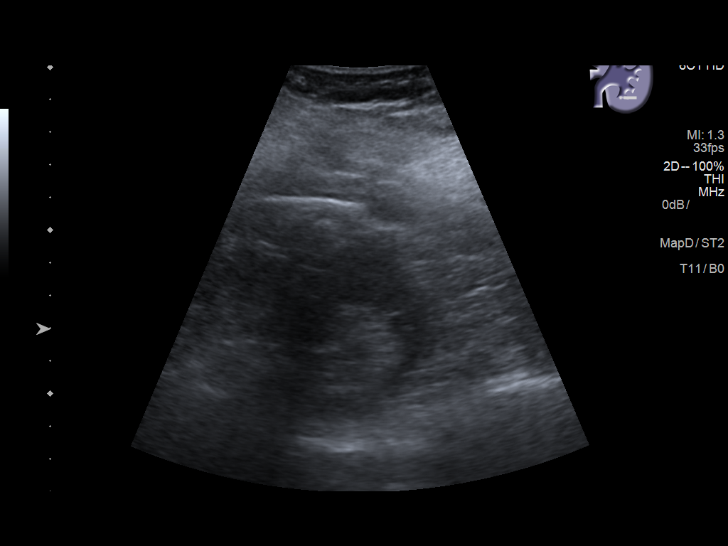
[im 33/40]
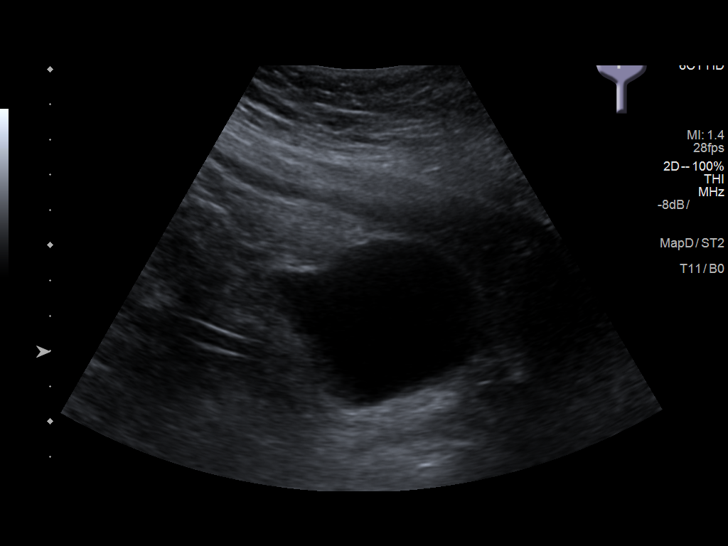
[im 36/40]
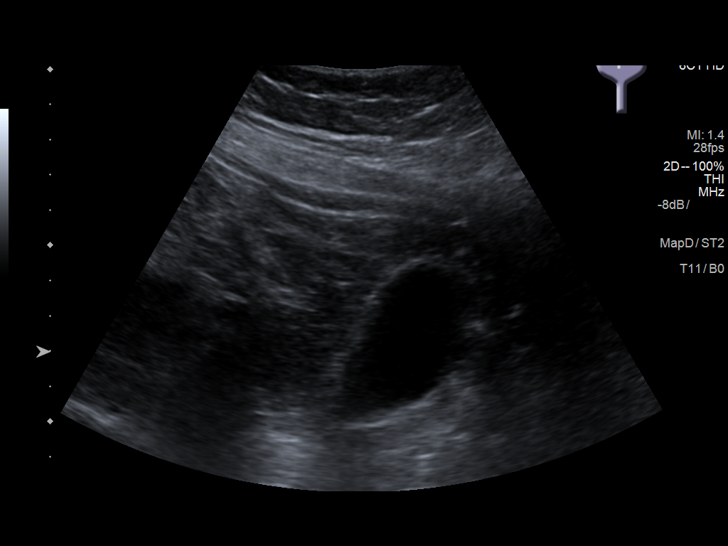
[im 40/40]
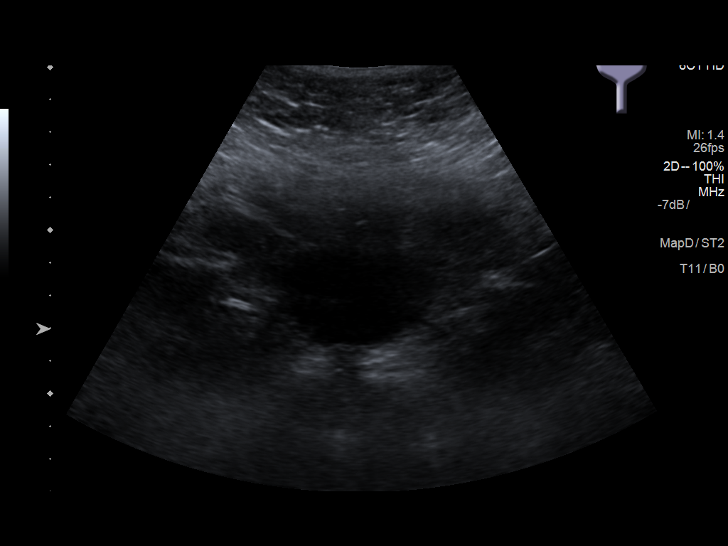

[14 of 25 positions shown; findings below may reference images not displayed]

FINDINGS: Right Kidney:

Length: 10.4 cm. Cortical thinning noted.. Echogenicity within
normal limits. No mass or hydronephrosis visualized.

Left Kidney:

Length: 11.3 cm. Hydronephrosis has resolved.. Echogenicity within
normal limits. No mass or hydronephrosis visualized.

Bladder:

Appears normal for degree of bladder distention.
IMPRESSION: Resolved left hydronephrosis.

Mild right renal cortical thinning.

## 2017-08-11 DIAGNOSIS — N952 Postmenopausal atrophic vaginitis: Secondary | ICD-10-CM | POA: Insufficient documentation

## 2018-02-14 ENCOUNTER — Other Ambulatory Visit: Payer: Self-pay | Admitting: Internal Medicine

## 2018-02-14 DIAGNOSIS — Z1231 Encounter for screening mammogram for malignant neoplasm of breast: Secondary | ICD-10-CM

## 2018-06-20 ENCOUNTER — Ambulatory Visit
Admission: RE | Admit: 2018-06-20 | Discharge: 2018-06-20 | Disposition: A | Discharge status: Home / Self Care | Payer: Commercial Managed Care - PPO | Source: Ambulatory Visit | Attending: Internal Medicine | Admitting: Internal Medicine

## 2018-06-20 DIAGNOSIS — Z1231 Encounter for screening mammogram for malignant neoplasm of breast: Secondary | ICD-10-CM | POA: Insufficient documentation

## 2018-06-20 IMAGING — MG MM DIGITAL SCREENING BILAT W/ TOMO W/ CAD
8 series · 8 of 24 positions shown · non-contrast
Comparison: Previous exam(s).

CLINICAL DATA: Screening.

EXAM:
DIGITAL SCREENING BILATERAL MAMMOGRAM WITH TOMO AND CAD

[L CC synth-2D]
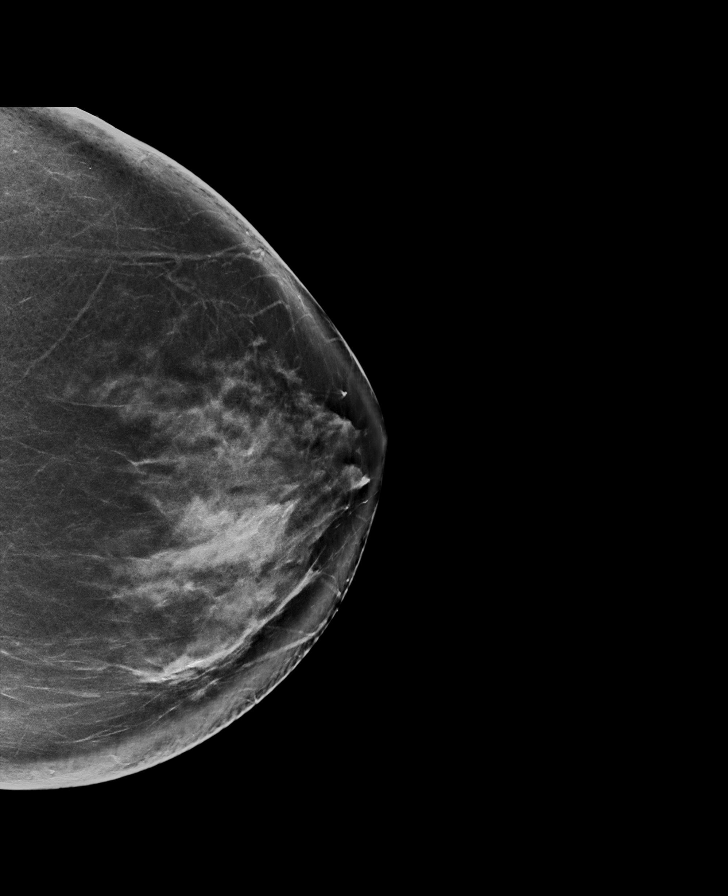

[R MLO synth-2D]
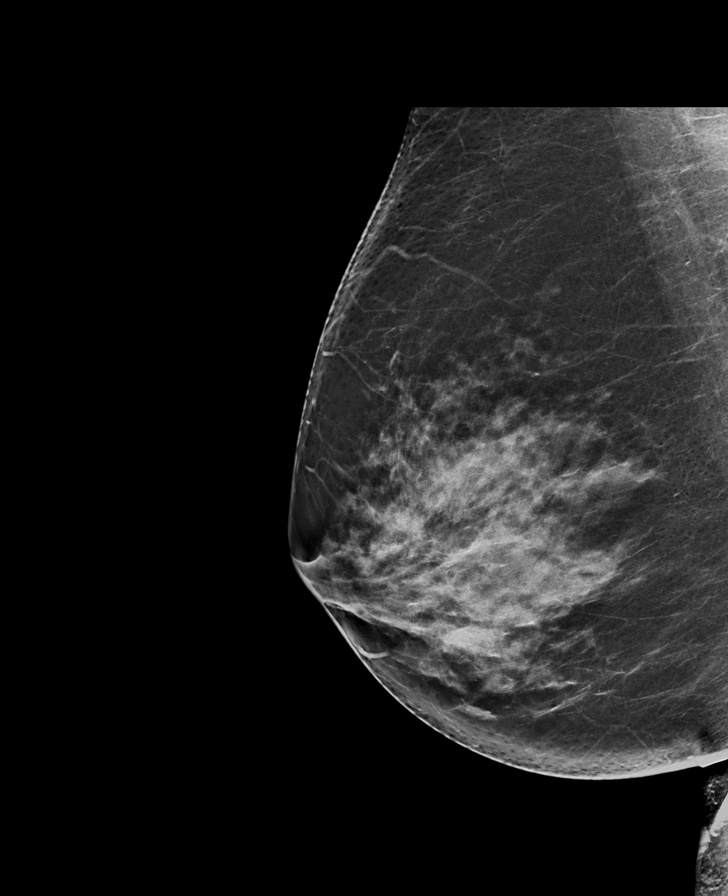

[R CC synth-2D]
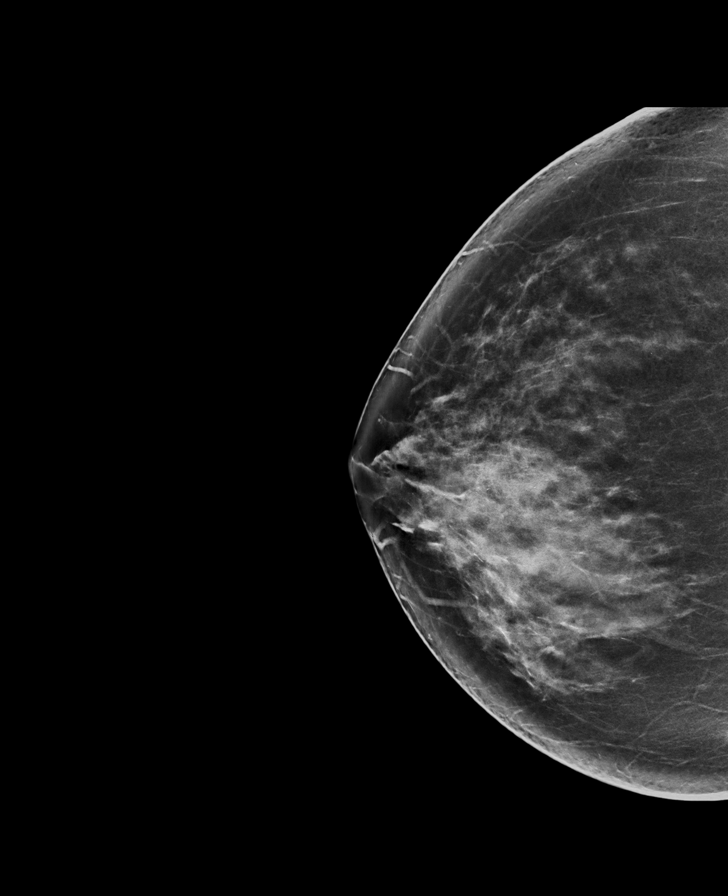

[L MLO synth-2D]
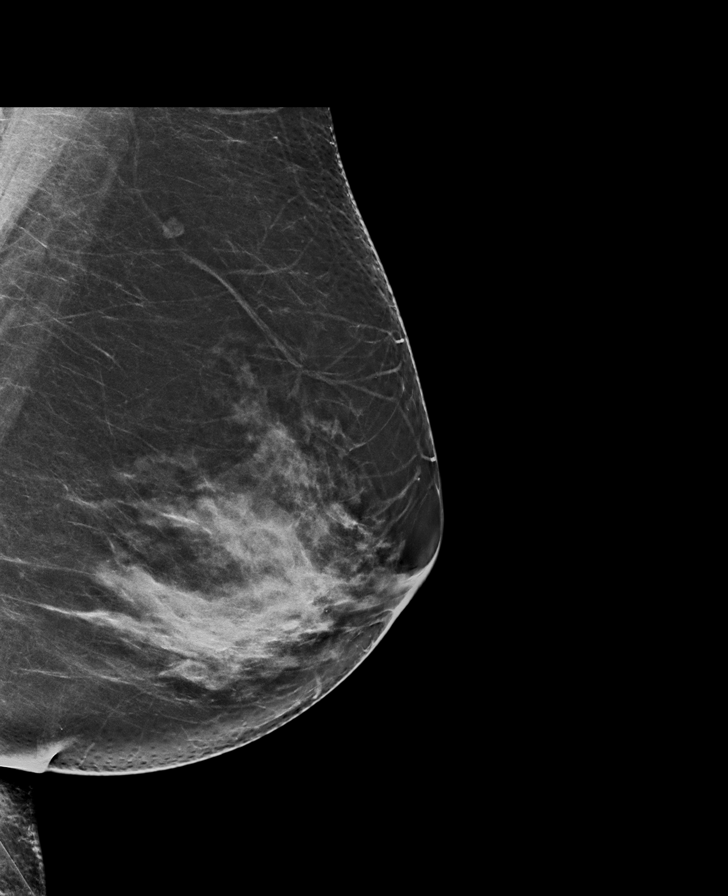

[R CC tomo · tomo slice 43/85.0]
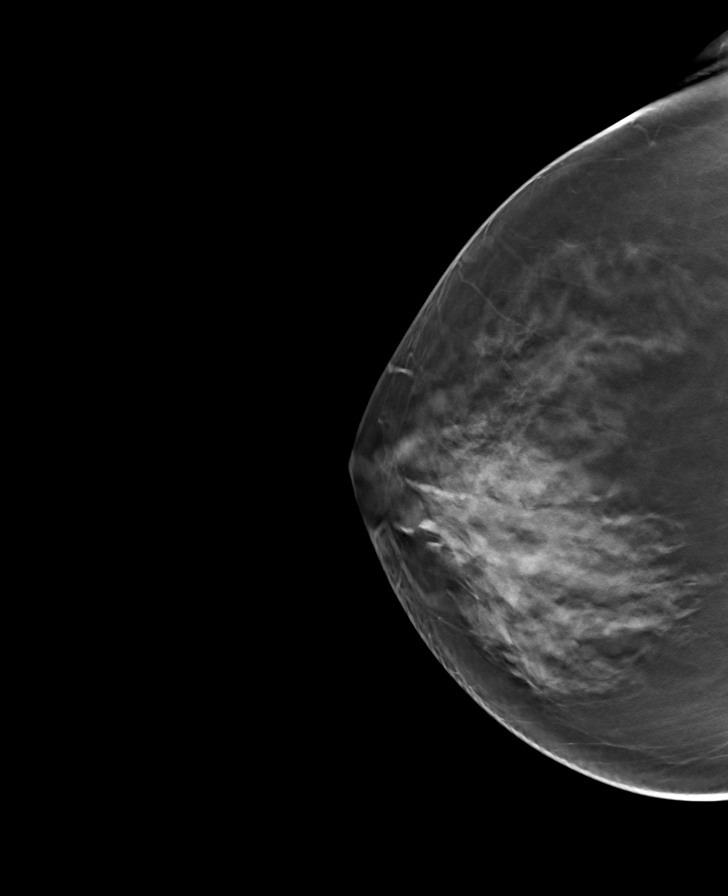

[L MLO tomo · tomo slice 43/84.0]
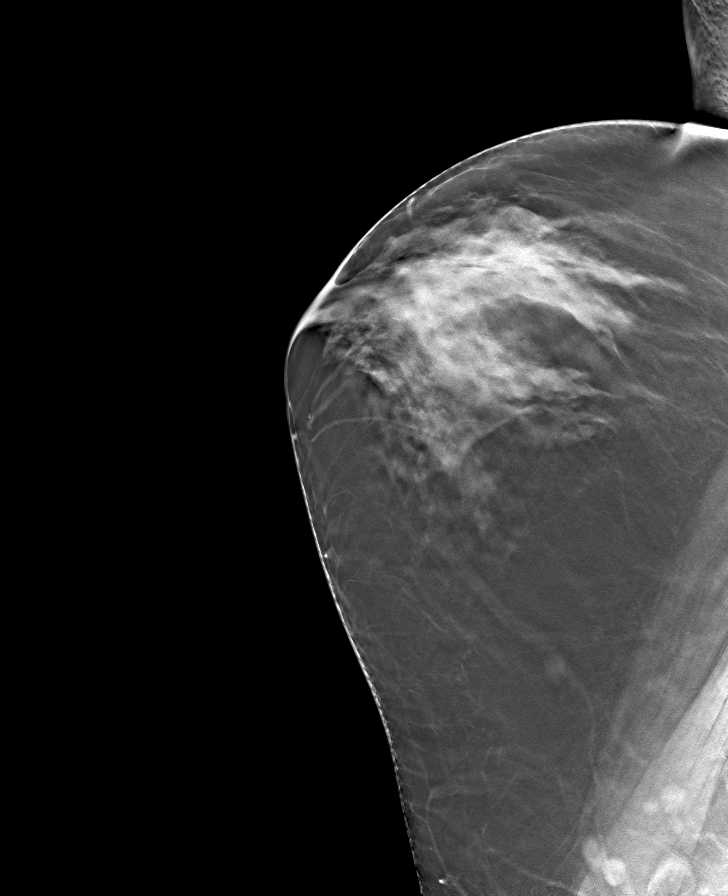

[L CC tomo · tomo slice 47/93.0]
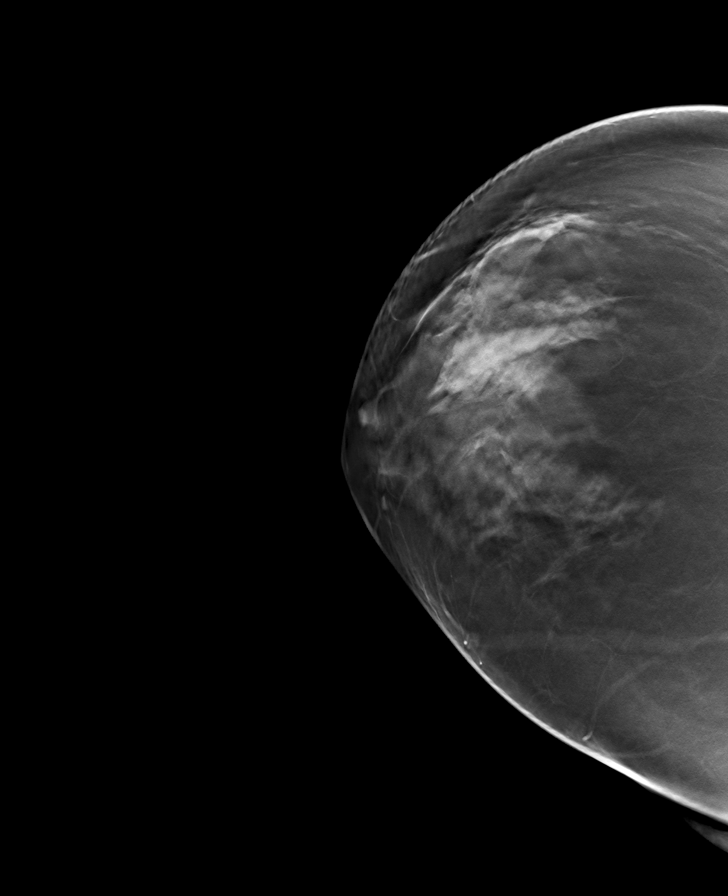

[R MLO tomo · tomo slice 41/82.0]
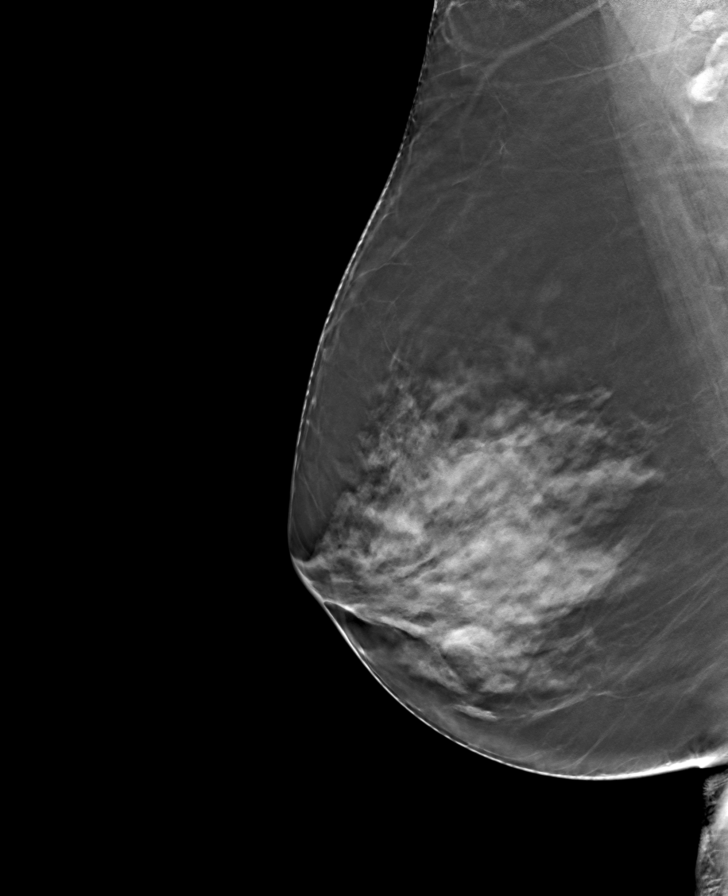

[8 of 24 positions shown; findings below may reference images not displayed]

ACR Breast Density Category c: The breast tissue is heterogeneously
dense, which may obscure small masses.
FINDINGS: There are no findings suspicious for malignancy. Images were
processed with CAD.
IMPRESSION: No mammographic evidence of malignancy. A result letter of this
screening mammogram will be mailed directly to the patient.

RECOMMENDATION:
Screening mammogram in one year. (Code:[5V])

BI-RADS CATEGORY  1: Negative.

## 2018-12-28 ENCOUNTER — Encounter
Admission: AD | Disposition: A | Discharge status: Home / Self Care | Payer: Self-pay | Source: Home / Self Care | Attending: Internal Medicine

## 2018-12-28 ENCOUNTER — Encounter: Payer: Self-pay | Admitting: *Deleted

## 2018-12-28 ENCOUNTER — Observation Stay
Admission: AD | Admit: 2018-12-28 | Discharge: 2018-12-29 | Disposition: A | Discharge status: Home / Self Care | Payer: Commercial Managed Care - PPO | Attending: Internal Medicine | Admitting: Internal Medicine

## 2018-12-28 ENCOUNTER — Other Ambulatory Visit: Payer: Self-pay

## 2018-12-28 DIAGNOSIS — I10 Essential (primary) hypertension: Secondary | ICD-10-CM | POA: Insufficient documentation

## 2018-12-28 DIAGNOSIS — Z880 Allergy status to penicillin: Secondary | ICD-10-CM | POA: Diagnosis not present

## 2018-12-28 DIAGNOSIS — Z79899 Other long term (current) drug therapy: Secondary | ICD-10-CM | POA: Diagnosis not present

## 2018-12-28 DIAGNOSIS — R9430 Abnormal result of cardiovascular function study, unspecified: Secondary | ICD-10-CM | POA: Insufficient documentation

## 2018-12-28 DIAGNOSIS — I200 Unstable angina: Secondary | ICD-10-CM | POA: Diagnosis present

## 2018-12-28 DIAGNOSIS — Z90710 Acquired absence of both cervix and uterus: Secondary | ICD-10-CM | POA: Insufficient documentation

## 2018-12-28 DIAGNOSIS — Z7982 Long term (current) use of aspirin: Secondary | ICD-10-CM | POA: Insufficient documentation

## 2018-12-28 DIAGNOSIS — Z6829 Body mass index (BMI) 29.0-29.9, adult: Secondary | ICD-10-CM | POA: Insufficient documentation

## 2018-12-28 DIAGNOSIS — Z955 Presence of coronary angioplasty implant and graft: Secondary | ICD-10-CM

## 2018-12-28 DIAGNOSIS — E669 Obesity, unspecified: Secondary | ICD-10-CM | POA: Diagnosis not present

## 2018-12-28 DIAGNOSIS — I25110 Atherosclerotic heart disease of native coronary artery with unstable angina pectoris: Secondary | ICD-10-CM | POA: Diagnosis not present

## 2018-12-28 HISTORY — PX: CORONARY STENT INTERVENTION: CATH118234

## 2018-12-28 HISTORY — PX: LEFT HEART CATH AND CORONARY ANGIOGRAPHY: CATH118249

## 2018-12-28 LAB — CBC WITH DIFFERENTIAL/PLATELET
Abs Immature Granulocytes: 0.01 10*3/uL (ref 0.00–0.07)
Basophils Absolute: 0 10*3/uL (ref 0.0–0.1)
Basophils Relative: 1 %
Eosinophils Absolute: 0.2 10*3/uL (ref 0.0–0.5)
Eosinophils Relative: 3 %
HCT: 37.9 % (ref 36.0–46.0)
Hemoglobin: 12.1 g/dL (ref 12.0–15.0)
Immature Granulocytes: 0 %
Lymphocytes Relative: 43 %
Lymphs Abs: 2.2 10*3/uL (ref 0.7–4.0)
MCH: 28.1 pg (ref 26.0–34.0)
MCHC: 31.9 g/dL (ref 30.0–36.0)
MCV: 87.9 fL (ref 80.0–100.0)
Monocytes Absolute: 0.3 10*3/uL (ref 0.1–1.0)
Monocytes Relative: 7 %
Neutro Abs: 2.3 10*3/uL (ref 1.7–7.7)
Neutrophils Relative %: 46 %
Platelets: 243 10*3/uL (ref 150–400)
RBC: 4.31 MIL/uL (ref 3.87–5.11)
RDW: 13.8 % (ref 11.5–15.5)
WBC: 5 10*3/uL (ref 4.0–10.5)
nRBC: 0 % (ref 0.0–0.2)

## 2018-12-28 LAB — BASIC METABOLIC PANEL
Anion gap: 7 (ref 5–15)
BUN: 15 mg/dL (ref 8–23)
CO2: 25 mmol/L (ref 22–32)
Calcium: 9 mg/dL (ref 8.9–10.3)
Chloride: 105 mmol/L (ref 98–111)
Creatinine, Ser: 0.86 mg/dL (ref 0.44–1.00)
GFR calc Af Amer: >60 mL/min (ref >60)
GFR calc non Af Amer: >60 mL/min (ref >60)
Glucose, Bld: 89 mg/dL (ref 70–99)
Potassium: 4.8 mmol/L (ref 3.5–5.1)
Sodium: 137 mmol/L (ref 135–145)

## 2018-12-28 LAB — PROTIME-INR
INR: 0.9 (ref 0.8–1.2)
Prothrombin Time: 12.2 seconds (ref 11.4–15.2)

## 2018-12-28 LAB — POCT ACTIVATED CLOTTING TIME: Activated Clotting Time: 318 seconds

## 2018-12-28 LAB — APTT: aPTT: 27 seconds (ref 24–36)

## 2018-12-28 SURGERY — LEFT HEART CATH AND CORONARY ANGIOGRAPHY
Anesthesia: Moderate Sedation

## 2018-12-28 MED ORDER — SODIUM CHLORIDE 0.9% FLUSH: 3.0000 mL | INTRAVENOUS | Status: DC | PRN

## 2018-12-28 MED ORDER — SODIUM CHLORIDE 0.9 % IV SOLN: 250.0000 mL | INTRAVENOUS | Status: DC | PRN

## 2018-12-28 MED ORDER — TICAGRELOR 90 MG PO TABS
90.0000 mg | ORAL_TABLET | Freq: Two times a day (BID) | ORAL | Status: DC
  Administered 2018-12-28 – 2018-12-29 (×2): 90 mg via ORAL
  Filled 2018-12-28 (×2): qty 1

## 2018-12-28 MED ORDER — HYDRALAZINE HCL 20 MG/ML IJ SOLN: 10.0000 mg | INTRAMUSCULAR | Status: AC | PRN

## 2018-12-28 MED ORDER — SODIUM CHLORIDE 0.9% FLUSH: 3.0000 mL | Freq: Two times a day (BID) | INTRAVENOUS | Status: DC

## 2018-12-28 MED ORDER — FENTANYL CITRATE (PF) 100 MCG/2ML IJ SOLN
INTRAMUSCULAR | Status: DC | PRN
  Administered 2018-12-28: 25 ug via INTRAVENOUS
  Administered 2018-12-28: 50 ug via INTRAVENOUS

## 2018-12-28 MED ORDER — PRIMIDONE 50 MG PO TABS
50.0000 mg | ORAL_TABLET | Freq: Every day | ORAL | Status: DC
  Administered 2018-12-28: 50 mg via ORAL
  Filled 2018-12-28 (×2): qty 1

## 2018-12-28 MED ORDER — ASPIRIN 81 MG PO CHEW
81.0000 mg | CHEWABLE_TABLET | ORAL | Status: AC
  Administered 2018-12-28: 08:00:00 81 mg via ORAL

## 2018-12-28 MED ORDER — ONDANSETRON HCL 4 MG/2ML IJ SOLN: 4.0000 mg | Freq: Four times a day (QID) | INTRAMUSCULAR | Status: DC | PRN

## 2018-12-28 MED ORDER — SODIUM CHLORIDE 0.9 % IV SOLN
INTRAVENOUS | Status: AC | PRN
  Administered 2018-12-28: 1.75 mg/kg/h via INTRAVENOUS

## 2018-12-28 MED ORDER — FENTANYL CITRATE (PF) 100 MCG/2ML IJ SOLN
INTRAMUSCULAR | Status: AC
  Filled 2018-12-28: qty 2

## 2018-12-28 MED ORDER — TICAGRELOR 90 MG PO TABS: 90.0000 mg | ORAL_TABLET | Freq: Two times a day (BID) | ORAL | 12 refills | Status: DC

## 2018-12-28 MED ORDER — BIVALIRUDIN BOLUS VIA INFUSION - CUPID
INTRAVENOUS | Status: DC | PRN
  Administered 2018-12-28: 59.55 mg via INTRAVENOUS

## 2018-12-28 MED ORDER — BIVALIRUDIN TRIFLUOROACETATE 250 MG IV SOLR
INTRAVENOUS | Status: AC
  Filled 2018-12-28: qty 250

## 2018-12-28 MED ORDER — MIDAZOLAM HCL 2 MG/2ML IJ SOLN
INTRAMUSCULAR | Status: DC | PRN
  Administered 2018-12-28: 1 mg via INTRAVENOUS
  Administered 2018-12-28: 0.5 mg via INTRAVENOUS

## 2018-12-28 MED ORDER — ROSUVASTATIN CALCIUM 40 MG PO TABS: 40.0000 mg | ORAL_TABLET | Freq: Every day | ORAL | 12 refills | Status: AC

## 2018-12-28 MED ORDER — SODIUM CHLORIDE 0.9% FLUSH
3.0000 mL | Freq: Two times a day (BID) | INTRAVENOUS | Status: DC
  Administered 2018-12-28 – 2018-12-29 (×2): 3 mL via INTRAVENOUS

## 2018-12-28 MED ORDER — TICAGRELOR 90 MG PO TABS
ORAL_TABLET | ORAL | Status: AC
  Filled 2018-12-28: qty 2

## 2018-12-28 MED ORDER — SODIUM CHLORIDE 0.9 % IV SOLN
0.2500 mg/kg/h | INTRAVENOUS | Status: AC
  Filled 2018-12-28: qty 250

## 2018-12-28 MED ORDER — TICAGRELOR 90 MG PO TABS
ORAL_TABLET | ORAL | Status: DC | PRN
  Administered 2018-12-28: 180 mg via ORAL

## 2018-12-28 MED ORDER — ROPINIROLE HCL 1 MG PO TABS
4.0000 mg | ORAL_TABLET | Freq: Every day | ORAL | Status: DC
  Administered 2018-12-28: 4 mg via ORAL
  Filled 2018-12-28: qty 4

## 2018-12-28 MED ORDER — SODIUM CHLORIDE 0.9 % WEIGHT BASED INFUSION: 1.0000 mL/kg/h | INTRAVENOUS | Status: AC

## 2018-12-28 MED ORDER — HEPARIN SODIUM (PORCINE) 1000 UNIT/ML IJ SOLN
INTRAMUSCULAR | Status: AC
  Filled 2018-12-28: qty 1

## 2018-12-28 MED ORDER — HEPARIN (PORCINE) IN NACL 1000-0.9 UT/500ML-% IV SOLN
INTRAVENOUS | Status: AC
  Filled 2018-12-28: qty 1000

## 2018-12-28 MED ORDER — ASPIRIN 81 MG PO CHEW
CHEWABLE_TABLET | ORAL | Status: DC | PRN
  Administered 2018-12-28: 243 mg via ORAL

## 2018-12-28 MED ORDER — ASPIRIN 81 MG PO CHEW
CHEWABLE_TABLET | ORAL | Status: AC
  Administered 2018-12-28: 08:00:00 81 mg via ORAL
  Filled 2018-12-28: qty 1

## 2018-12-28 MED ORDER — ASPIRIN 81 MG PO CHEW
CHEWABLE_TABLET | ORAL | Status: AC
  Filled 2018-12-28: qty 3

## 2018-12-28 MED ORDER — TAMSULOSIN HCL 0.4 MG PO CAPS
0.4000 mg | ORAL_CAPSULE | Freq: Every day | ORAL | Status: DC
  Administered 2018-12-28 – 2018-12-29 (×2): 0.4 mg via ORAL
  Filled 2018-12-28 (×2): qty 1

## 2018-12-28 MED ORDER — SODIUM CHLORIDE 0.9 % WEIGHT BASED INFUSION
238.2000 mL/h | INTRAVENOUS | Status: AC
  Administered 2018-12-28: 3 mL/kg/h via INTRAVENOUS

## 2018-12-28 MED ORDER — LABETALOL HCL 5 MG/ML IV SOLN: 10.0000 mg | INTRAVENOUS | Status: AC | PRN

## 2018-12-28 MED ORDER — ROSUVASTATIN CALCIUM 20 MG PO TABS
40.0000 mg | ORAL_TABLET | Freq: Every day | ORAL | Status: DC
  Administered 2018-12-28: 40 mg via ORAL
  Filled 2018-12-28: qty 4
  Filled 2018-12-28 (×2): qty 2

## 2018-12-28 MED ORDER — SODIUM CHLORIDE 0.9 % WEIGHT BASED INFUSION: 1.0000 mL/kg/h | INTRAVENOUS | Status: DC

## 2018-12-28 MED ORDER — NITROGLYCERIN 1 MG/10 ML FOR IR/CATH LAB
INTRA_ARTERIAL | Status: AC
  Filled 2018-12-28: qty 10

## 2018-12-28 MED ORDER — VERAPAMIL HCL 2.5 MG/ML IV SOLN
INTRAVENOUS | Status: AC
  Filled 2018-12-28: qty 2

## 2018-12-28 MED ORDER — IOPAMIDOL (ISOVUE-300) INJECTION 61%
INTRAVENOUS | Status: DC | PRN
  Administered 2018-12-28: 210 mL via INTRA_ARTERIAL

## 2018-12-28 MED ORDER — MIDAZOLAM HCL 2 MG/2ML IJ SOLN
INTRAMUSCULAR | Status: AC
  Filled 2018-12-28: qty 2

## 2018-12-28 MED ORDER — ACETAMINOPHEN 325 MG PO TABS: 650.0000 mg | ORAL_TABLET | ORAL | Status: DC | PRN

## 2018-12-28 MED ORDER — ASPIRIN 81 MG PO CHEW
81.0000 mg | CHEWABLE_TABLET | Freq: Every day | ORAL | Status: DC
  Administered 2018-12-29: 81 mg via ORAL
  Filled 2018-12-28: qty 1

## 2018-12-28 MED ORDER — FLUOXETINE HCL 20 MG PO CAPS
20.0000 mg | ORAL_CAPSULE | Freq: Every day | ORAL | Status: DC
  Administered 2018-12-28 – 2018-12-29 (×2): 20 mg via ORAL
  Filled 2018-12-28 (×2): qty 1

## 2018-12-28 SURGICAL SUPPLY — 22 items
BALLN TREK RX 2.5X12 (BALLOONS) ×3
BALLN ~~LOC~~ TREK RX 2.75X8 (BALLOONS) ×3
BALLOON TREK RX 2.5X12 (BALLOONS) ×2 IMPLANT
BALLOON ~~LOC~~ TREK RX 2.75X8 (BALLOONS) ×2 IMPLANT
CATH INFINITI 5FR ANG PIGTAIL (CATHETERS) ×3 IMPLANT
CATH INFINITI 5FR JL4 (CATHETERS) ×3 IMPLANT
CATH INFINITI JR4 5F (CATHETERS) ×3 IMPLANT
CATH VISTA GUIDE 6FRXBLAD3.5SH (CATHETERS) ×3 IMPLANT
DEVICE CLOSURE MYNXGRIP 5F (Vascular Products) ×3 IMPLANT
DEVICE CLOSURE MYNXGRIP 6/7F (Vascular Products) ×3 IMPLANT
DEVICE INFLAT 30 PLUS (MISCELLANEOUS) ×3 IMPLANT
GLIDESHEATH SLEND SS 6F .021 (SHEATH) ×3 IMPLANT
KIT MANI 3VAL PERCEP (MISCELLANEOUS) ×3 IMPLANT
NEEDLE PERC 18GX7CM (NEEDLE) ×3 IMPLANT
PACK CARDIAC CATH (CUSTOM PROCEDURE TRAY) ×3 IMPLANT
SHEATH AVANTI 5FR X 11CM (SHEATH) ×6 IMPLANT
SHEATH AVANTI 6FR X 11CM (SHEATH) ×3 IMPLANT
STENT RESOLUTE ONYX 2.5X12 (Permanent Stent) ×3 IMPLANT
WIRE G HI TQ BMW 190 (WIRE) ×3 IMPLANT
WIRE GUIDERIGHT .035X150 (WIRE) ×3 IMPLANT
WIRE NITINOL .018 (WIRE) ×3 IMPLANT
WIRE ROSEN-J .035X260CM (WIRE) IMPLANT

## 2018-12-28 NOTE — Plan of Care (Signed)

## 2018-12-28 NOTE — Progress Notes (Signed)
Pt present for procedure this AM, unable to find orders, H/P, or recent labs/test in Epic. Dr. Clayborn Bigness paged and updated. Cath Lab staff also updated.

## 2018-12-28 NOTE — H&P (Signed)
Renee Stephens is an 63 y.o. female.   Chief Complaint: Unstable angina chest pain symptoms HPI: Patient presents with unstable anginal symptoms on and off recurrently at rest and with exertion underwent functional study which is all of equivocal patient is now here presented for definitive evaluation for cardiac cath to rule out significant coronary disease.  Complete H&P and work-up was performed in the office but now this is an updated note prior to cardiac cath R temple be radially  History reviewed. No pertinent past medical history.  Past Surgical History:  Procedure Laterality Date  . ABDOMINAL HYSTERECTOMY  1995    Family History  Problem Relation Age of Onset  . Kidney failure Father   . Kidney failure Mother   . Breast cancer Neg Hx   . Bladder Cancer Neg Hx    Social History:  reports that she has never smoked. She has never used smokeless tobacco. She reports that she does not drink alcohol or use drugs.  Allergies:  Allergies  Allergen Reactions  . Penicillin G Swelling and Rash    Did it involve swelling of the face/tongue/throat, SOB, or low BP? Yes Did it involve sudden or severe rash/hives, skin peeling, or any reaction on the inside of your mouth or nose? No Did you need to seek medical attention at a hospital or doctor's office? Yes When did it last happen?childhood allergy If all above answers are "NO", may proceed with cephalosporin use.     Medications Prior to Admission  Medication Sig Dispense Refill  . aspirin EC 81 MG tablet Take 81 mg by mouth daily.    Marland Kitchen FLUoxetine (PROZAC) 20 MG capsule Take 20 mg by mouth daily.     . isosorbide mononitrate (IMDUR) 30 MG 24 hr tablet Take 30 mg by mouth daily.    . metoprolol succinate (TOPROL-XL) 25 MG 24 hr tablet Take 25 mg by mouth daily.    . primidone (MYSOLINE) 50 MG tablet Take 50 mg by mouth daily.    Marland Kitchen rOPINIRole (REQUIP) 1 MG tablet Take 4 mg by mouth at bedtime.    . ondansetron (ZOFRAN  ODT) 4 MG disintegrating tablet Allow 1-2 tablets to dissolve in your mouth every 8 hours as needed for nausea/vomiting (Patient not taking: Reported on 04/09/2017) 30 tablet 0  . oxyCODONE-acetaminophen (ROXICET) 5-325 MG tablet Take 1-2 tablets by mouth every 6 (six) hours as needed for severe pain. (Patient not taking: Reported on 04/09/2017) 30 tablet 0  . tamsulosin (FLOMAX) 0.4 MG CAPS capsule Take 1 tablet by mouth daily until you pass the kidney stone or no longer have symptoms (Patient not taking: Reported on 04/09/2017) 14 capsule 0  . tamsulosin (FLOMAX) 0.4 MG CAPS capsule Take 1 capsule (0.4 mg total) by mouth daily. (Patient not taking: Reported on 04/09/2017) 30 capsule 0    Results for orders placed or performed during the hospital encounter of 12/28/18 (from the past 48 hour(s))  Basic metabolic panel     Status: None   Collection Time: 12/28/18  7:37 AM  Result Value Ref Range   Sodium 137 135 - 145 mmol/L   Potassium 4.8 3.5 - 5.1 mmol/L   Chloride 105 98 - 111 mmol/L   CO2 25 22 - 32 mmol/L   Glucose, Bld 89 70 - 99 mg/dL   BUN 15 8 - 23 mg/dL   Creatinine, Ser 0.86 0.44 - 1.00 mg/dL   Calcium 9.0 8.9 - 10.3 mg/dL   GFR calc non  Af Amer >60 >60 mL/min   GFR calc Af Amer >60 >60 mL/min   Anion gap 7 5 - 15    Comment: Performed at [facility], [address] [place], [place] [address]  CBC with Differential/Platelet     Status: None   Collection Time: 12/28/18  7:37 AM  Result Value Ref Range   WBC 5.0 4.0 - 10.5 K/uL   RBC 4.31 3.87 - 5.11 MIL/uL   Hemoglobin 12.1 12.0 - 15.0 g/dL   HCT 37.9 36.0 - 46.0 %   MCV 87.9 80.0 - 100.0 fL   MCH 28.1 26.0 - 34.0 pg   MCHC 31.9 30.0 - 36.0 g/dL   RDW 13.8 11.5 - 15.5 %   Platelets 243 150 - 400 K/uL   nRBC 0.0 0.0 - 0.2 %   Neutrophils Relative % 46 %   Neutro Abs 2.3 1.7 - 7.7 K/uL   Lymphocytes Relative 43 %   Lymphs Abs 2.2 0.7 - 4.0 K/uL   Monocytes Relative 7 %   Monocytes Absolute 0.3 0.1 - 1.0 K/uL    Eosinophils Relative 3 %   Eosinophils Absolute 0.2 0.0 - 0.5 K/uL   Basophils Relative 1 %   Basophils Absolute 0.0 0.0 - 0.1 K/uL   Immature Granulocytes 0 %   Abs Immature Granulocytes 0.01 0.00 - 0.07 K/uL    Comment: Performed at [facility], [address] [place], [place] [address]  Protime-INR     Status: None   Collection Time: 12/28/18  7:37 AM  Result Value Ref Range   Prothrombin Time 12.2 11.4 - 15.2 seconds   INR 0.9 0.8 - 1.2    Comment: (NOTE) INR goal varies based on device and disease states. Performed at [facility], [address] [place], [place] [address]   APTT     Status: None   Collection Time: 12/28/18  7:37 AM  Result Value Ref Range   aPTT 27 24 - 36 seconds    Comment: Performed at [facility], [address] [place], [place] [address]   No results found.  Review of Systems  Constitutional: Negative.   HENT: Negative.   Eyes: Negative.   Respiratory: Negative.   Cardiovascular: Positive for chest pain.  Gastrointestinal: Negative.   Genitourinary: Negative.   Musculoskeletal: Negative.   Skin: Negative.   Neurological: Negative.   Endo/Heme/Allergies: Negative.   Psychiatric/Behavioral: Negative.     Blood pressure 121/72, pulse (!) 51, temperature 98.1 F (36.7 C), temperature source Oral, resp. rate 14, height 5\' 6"  (1.676 m), weight 79.4 kg, SpO2 100 %. Physical Exam  Nursing note and vitals reviewed. Constitutional: She appears well-developed and well-nourished.  HENT:  Head: Normocephalic and atraumatic.  Eyes: Pupils are equal, round, and reactive to light. Conjunctivae and EOM are normal.  Neck: Normal range of motion. Neck supple.  Cardiovascular: Normal rate, regular rhythm and normal heart sounds.  Respiratory: Effort normal and breath sounds normal.  GI: Soft. Bowel sounds are normal.  Musculoskeletal: Normal range of motion.  Neurological: She is alert. She has normal reflexes.  Skin:  Skin is warm and dry.  Psychiatric: She has a normal mood and affect.     Assessment/Plan Unstable angina Hypertension Abnormal functional study Obesity . Plan Recommend proceed with invasive strategy Proceed with cardiac cath to definitively evaluate for unstable anginal symptoms  Yolonda Kida, MD 12/28/2018, 8:29 AM

## 2018-12-29 ENCOUNTER — Encounter: Payer: Self-pay | Admitting: Internal Medicine

## 2018-12-29 DIAGNOSIS — I25110 Atherosclerotic heart disease of native coronary artery with unstable angina pectoris: Secondary | ICD-10-CM | POA: Diagnosis not present

## 2018-12-29 LAB — CREATININE, SERUM
Creatinine, Ser: 0.87 mg/dL (ref 0.44–1.00)
GFR calc Af Amer: >60 mL/min (ref >60)
GFR calc non Af Amer: >60 mL/min (ref >60)

## 2018-12-29 LAB — BUN: BUN: 16 mg/dL (ref 8–23)

## 2018-12-29 NOTE — Plan of Care (Signed)
  Problem: Activity: Goal: Ability to return to baseline activity level will improve Outcome: Progressing   Problem: Activity: Goal: Risk for activity intolerance will decrease Outcome: Progressing   Problem: Pain Managment: Goal: General experience of comfort will improve Outcome: Progressing   Problem: Safety: Goal: Ability to remain free from injury will improve Outcome: Progressing   Problem: Skin Integrity: Goal: Risk for impaired skin integrity will decrease Outcome: Progressing   Problem: Cardiovascular: Goal: Vascular access site(s) Level 0-1 will be maintained Outcome: Completed/Met   Problem: Education: Goal: Knowledge of General Education information will improve Description Including pain rating scale, medication(s)/side effects and non-pharmacologic comfort measures Outcome: Completed/Met   Problem: Clinical Measurements: Goal: Will remain free from infection Outcome: Completed/Met Goal: Diagnostic test results will improve Outcome: Completed/Met Goal: Respiratory complications will improve Outcome: Completed/Met   Problem: Nutrition: Goal: Adequate nutrition will be maintained Outcome: Completed/Met   Problem: Coping: Goal: Level of anxiety will decrease Outcome: Completed/Met

## 2018-12-29 NOTE — Progress Notes (Signed)
IVs and tele removed from patient. Discharge instructions given to patient. Verbalized understanding. No acute distress at this time. Husband to transport patient home.

## 2018-12-29 NOTE — Progress Notes (Signed)
Cardiovascular and Pulmonary Nurse Navigator Note:    63 year old female with with hx of  HTN, obesity, RLS, anxiety, pre-diabetic, and  essential tremor who presented to [facility] for Cardiac Cath due to abnormal outpatient stress test, SOB, and angina.    12/28/2018 Callwood, Dwayne D, MD (Primary)    Procedures   CORONARY STENT INTERVENTION  LEFT HEART CATH AND CORONARY ANGIOGRAPHY  Conclusion     Proximal LAD lesion is 95% stenosed.  Normal overall left ventricular function   Conclusion Diagnostic cardiac cath Normal left ventricular function of 60% Normal right coronary artery Left main and circumflex appeared to be free of disease Proximal LAD lesion 95% lesion with TIMI-3 flow Intervention Successful PCI and stent of proximal LAD with DES reducing lesion from 95 down to 0% 2.5 x 12 mm resolute Onyx postdilated with a 2.75 x 8 mm [place] trek to 18 atm   EDUCATION:   Rounded on patient to provide education.  Patient sitting up in bed with television on.  "Angioplasty and Stents" booklet given and reviewed with patient. Discussed the definition of CAD. Reviewed the location of CAD and where her stent was placed. Informed patient she will be given a stent card. Explained the purpose of the stent card. Instructed patient to keep stent card in her wallet.  ? Discussed modifiable risk factors including controlling blood pressure, cholesterol, and blood sugar; following heart healthy diet; maintaining healthy weight; exercise; and smoking cessation, if applicable.   ? Discussed cardiac medications including rationale for taking, mechanisms of action, and side effects. Stressed the importance of taking medications as prescribed. Patient being discharged on Metoprolol, Crestor, Brilinta, and Aspirin.   Brilinta discount card for [employer] given to patient.    Discussed emergency plan for heart attack symptoms. Patient verbalized understanding of need to call 911 and not to  drive herself to ER if having cardiac symptoms / chest pain.  ? Heart healthy diet of low sodium, low fat, low cholesterol heart healthy diet discussed. Information on diet provided.  ? Smoking Cessation - Patient is NEVER smoker ? Exercise - Benefits of exercised discussed. Patient reports she does not exercise, has a sedentary job, and is pre-diabetic.  Discussed the American Heart Association guidelines for moderate exercise for all adults.  Patient lives in [place] complex where she will be able to walk.  Discussed gradually increasing her time and distance with walking.  Informed patient that her cardiologist has referred her to outpatient Cardiac Rehab. An overview of the program was provided. Brochure, informational letter, class and orientation times, and CPT billing codes given to patient. Patient is interested in participating. Patient plans to check with her insurance company to see what her out-of-pocket expenses will be. Patient is aware the Cardiac Rehab department is currently closed due to the COVID-19 pandemic.  The Cardiac Rehab dept will contact patient as soon as the dept reopens.    Patient expressed appreciation for the above information.  ? Roanna Epley, RN, BSN, [place]  [facility] Pulmonary Rehab  Cardiovascular & Pulmonary Nurse Navigator  Direct Line: [phone]  Department Phone #: [phone] Fax: [phone]  Email Address: [email or web address]

## 2019-04-12 DIAGNOSIS — E7800 Pure hypercholesterolemia, unspecified: Secondary | ICD-10-CM | POA: Insufficient documentation

## 2019-05-05 ENCOUNTER — Other Ambulatory Visit: Payer: Self-pay | Admitting: Internal Medicine

## 2019-05-05 DIAGNOSIS — Z1231 Encounter for screening mammogram for malignant neoplasm of breast: Secondary | ICD-10-CM

## 2019-08-08 ENCOUNTER — Ambulatory Visit
Admission: RE | Admit: 2019-08-08 | Discharge: 2019-08-08 | Disposition: A | Discharge status: Home / Self Care | Payer: Commercial Managed Care - PPO | Source: Ambulatory Visit | Attending: Internal Medicine | Admitting: Internal Medicine

## 2019-08-08 DIAGNOSIS — Z1231 Encounter for screening mammogram for malignant neoplasm of breast: Secondary | ICD-10-CM | POA: Insufficient documentation

## 2019-08-08 IMAGING — MG DIGITAL SCREENING BILAT W/ TOMO W/ CAD
8 series · 8 of 24 positions shown · non-contrast
Comparison: Previous exam(s).

CLINICAL DATA: Screening.

EXAM:
DIGITAL SCREENING BILATERAL MAMMOGRAM WITH TOMO AND CAD

[R MLO synth-2D]
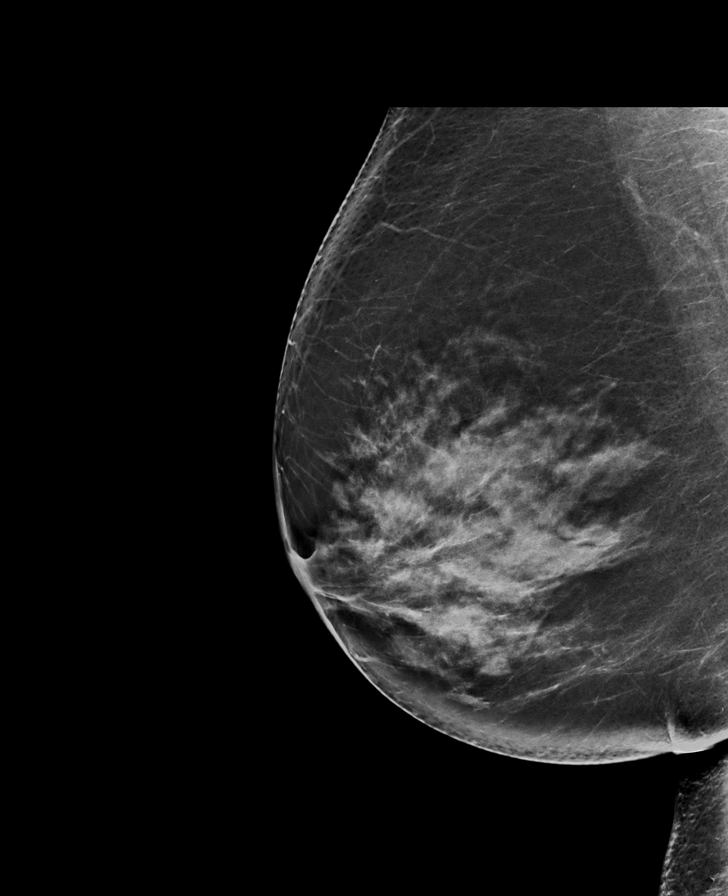

[L CC synth-2D]
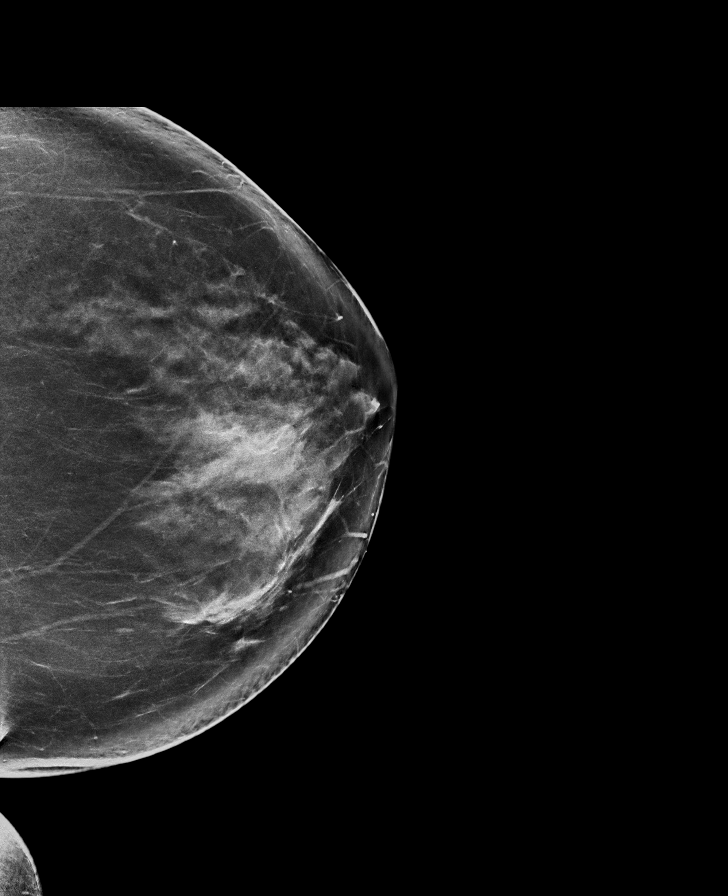

[R CC synth-2D]
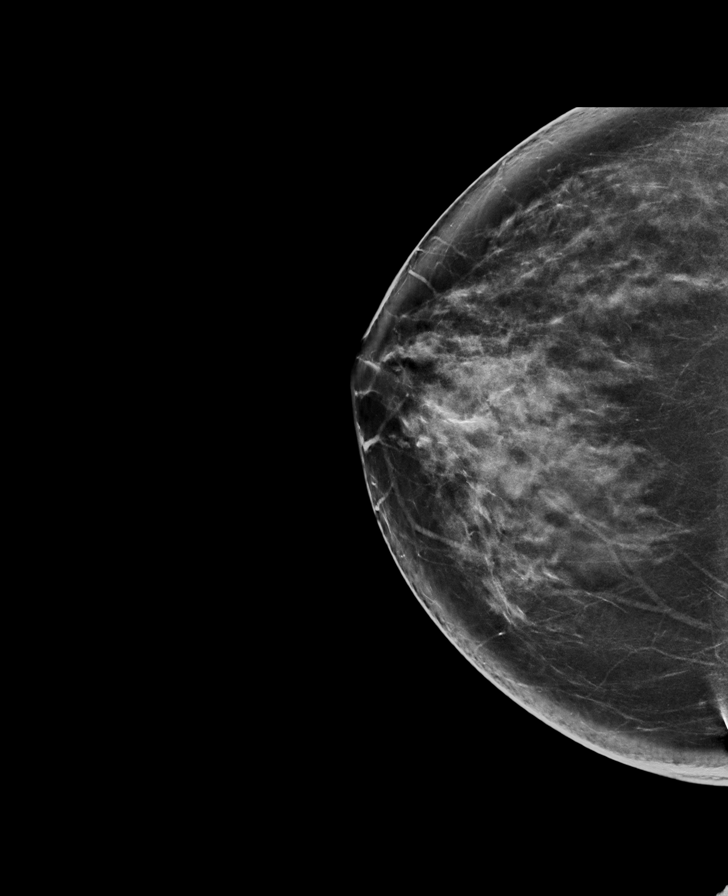

[L MLO synth-2D]
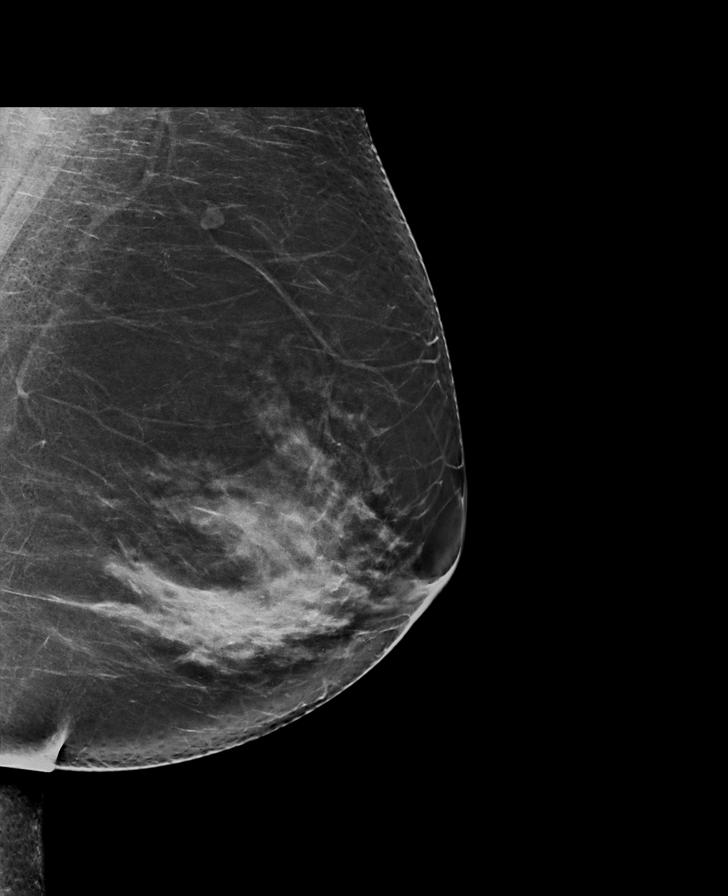

[R CC tomo · tomo slice 45/90.0]
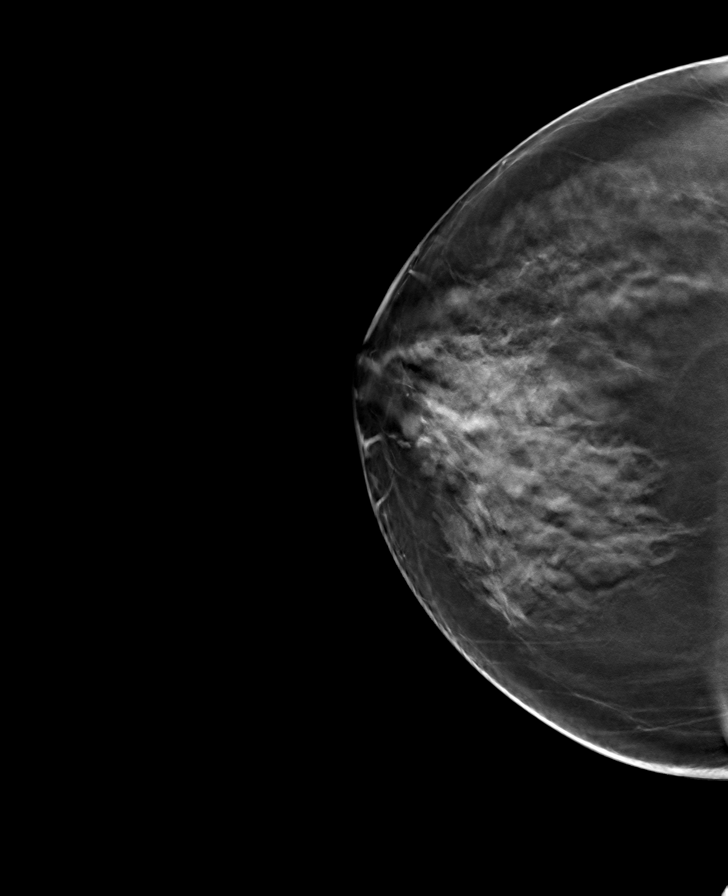

[R MLO tomo · tomo slice 46/91.0]
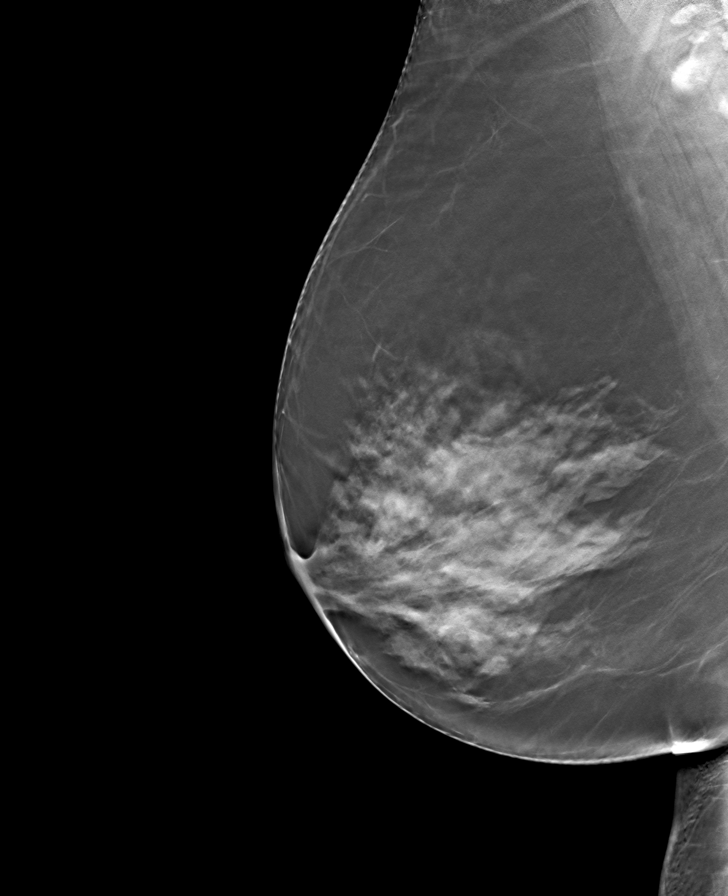

[L CC tomo · tomo slice 48/95.0]
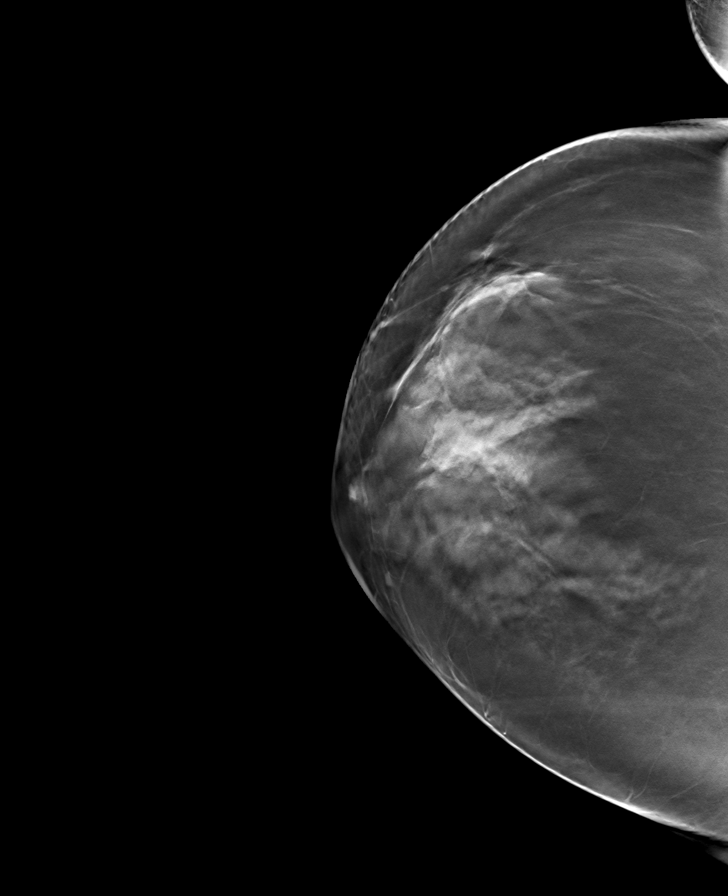

[L MLO tomo · tomo slice 47/93.0]
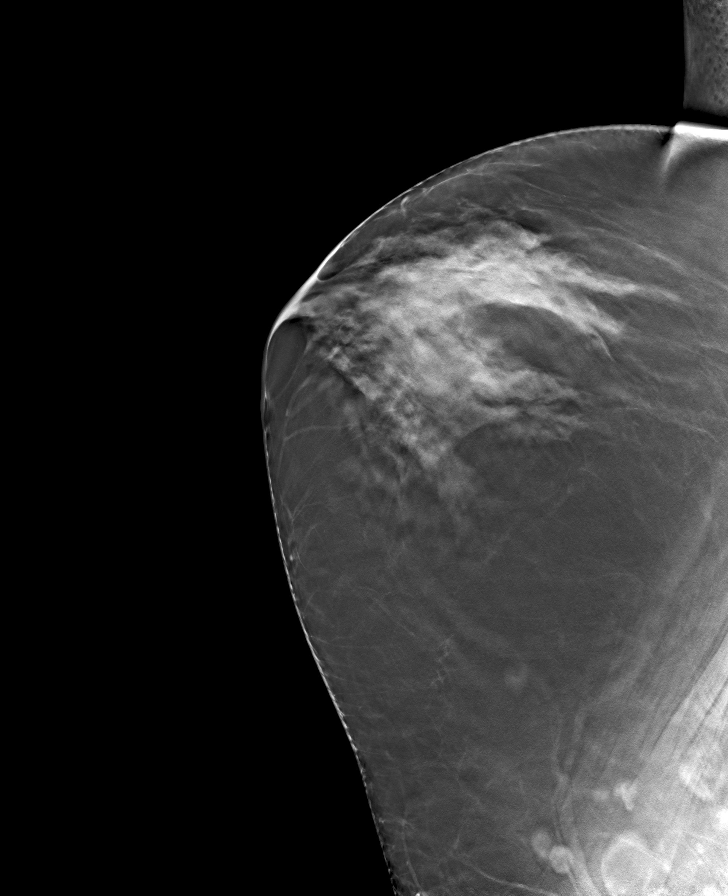

[8 of 24 positions shown; findings below may reference images not displayed]

ACR Breast Density Category c: The breast tissue is heterogeneously
dense, which may obscure small masses.
FINDINGS: There are no findings suspicious for malignancy. Images were
processed with CAD.
IMPRESSION: No mammographic evidence of malignancy. A result letter of this
screening mammogram will be mailed directly to the patient.

RECOMMENDATION:
Screening mammogram in one year. (Code:[5V])

BI-RADS CATEGORY  1: Negative.

## 2019-10-25 ENCOUNTER — Other Ambulatory Visit
Admission: RE | Admit: 2019-10-25 | Discharge: 2019-10-25 | Disposition: A | Discharge status: Home / Self Care | Payer: Commercial Managed Care - PPO | Source: Ambulatory Visit | Attending: Student | Admitting: Student

## 2019-10-25 DIAGNOSIS — R002 Palpitations: Secondary | ICD-10-CM | POA: Insufficient documentation

## 2019-10-25 DIAGNOSIS — Z01818 Encounter for other preprocedural examination: Secondary | ICD-10-CM | POA: Insufficient documentation

## 2019-10-25 LAB — BRAIN NATRIURETIC PEPTIDE: B Natriuretic Peptide: 256 pg/mL — ABNORMAL HIGH (ref 0.0–100.0)

## 2019-10-30 ENCOUNTER — Other Ambulatory Visit: Payer: Self-pay

## 2019-10-30 ENCOUNTER — Other Ambulatory Visit
Admission: RE | Admit: 2019-10-30 | Discharge: 2019-10-30 | Disposition: A | Discharge status: Home / Self Care | Payer: Commercial Managed Care - PPO | Source: Ambulatory Visit | Attending: Internal Medicine | Admitting: Internal Medicine

## 2019-10-30 DIAGNOSIS — Z20822 Contact with and (suspected) exposure to covid-19: Secondary | ICD-10-CM | POA: Diagnosis not present

## 2019-10-30 DIAGNOSIS — Z01812 Encounter for preprocedural laboratory examination: Secondary | ICD-10-CM | POA: Insufficient documentation

## 2019-10-30 LAB — SARS CORONAVIRUS 2 (TAT 6-24 HRS): SARS Coronavirus 2: NEGATIVE

## 2019-11-01 ENCOUNTER — Other Ambulatory Visit: Payer: Self-pay

## 2019-11-01 ENCOUNTER — Encounter
Admission: RE | Disposition: A | Discharge status: Home / Self Care | Payer: Self-pay | Source: Home / Self Care | Attending: Internal Medicine

## 2019-11-01 ENCOUNTER — Ambulatory Visit
Admission: RE | Admit: 2019-11-01 | Discharge: 2019-11-01 | Disposition: A | Discharge status: Home / Self Care | Payer: Commercial Managed Care - PPO | Attending: Internal Medicine | Admitting: Internal Medicine

## 2019-11-01 ENCOUNTER — Encounter: Payer: Self-pay | Admitting: Internal Medicine

## 2019-11-01 DIAGNOSIS — E7800 Pure hypercholesterolemia, unspecified: Secondary | ICD-10-CM | POA: Diagnosis not present

## 2019-11-01 DIAGNOSIS — E785 Hyperlipidemia, unspecified: Secondary | ICD-10-CM | POA: Diagnosis not present

## 2019-11-01 DIAGNOSIS — Z79899 Other long term (current) drug therapy: Secondary | ICD-10-CM | POA: Diagnosis not present

## 2019-11-01 DIAGNOSIS — Z7982 Long term (current) use of aspirin: Secondary | ICD-10-CM | POA: Diagnosis not present

## 2019-11-01 DIAGNOSIS — F419 Anxiety disorder, unspecified: Secondary | ICD-10-CM | POA: Diagnosis not present

## 2019-11-01 DIAGNOSIS — G2581 Restless legs syndrome: Secondary | ICD-10-CM | POA: Insufficient documentation

## 2019-11-01 DIAGNOSIS — I2510 Atherosclerotic heart disease of native coronary artery without angina pectoris: Secondary | ICD-10-CM

## 2019-11-01 DIAGNOSIS — Z955 Presence of coronary angioplasty implant and graft: Secondary | ICD-10-CM | POA: Diagnosis not present

## 2019-11-01 DIAGNOSIS — I25118 Atherosclerotic heart disease of native coronary artery with other forms of angina pectoris: Secondary | ICD-10-CM | POA: Insufficient documentation

## 2019-11-01 DIAGNOSIS — Z880 Allergy status to penicillin: Secondary | ICD-10-CM | POA: Insufficient documentation

## 2019-11-01 DIAGNOSIS — Z7902 Long term (current) use of antithrombotics/antiplatelets: Secondary | ICD-10-CM | POA: Insufficient documentation

## 2019-11-01 DIAGNOSIS — R7303 Prediabetes: Secondary | ICD-10-CM | POA: Insufficient documentation

## 2019-11-01 HISTORY — PX: LEFT HEART CATH AND CORONARY ANGIOGRAPHY: CATH118249

## 2019-11-01 SURGERY — LEFT HEART CATH AND CORONARY ANGIOGRAPHY
Anesthesia: Moderate Sedation

## 2019-11-01 MED ORDER — HYDRALAZINE HCL 20 MG/ML IJ SOLN: 10.0000 mg | INTRAMUSCULAR | Status: DC | PRN

## 2019-11-01 MED ORDER — MIDAZOLAM HCL 2 MG/2ML IJ SOLN
INTRAMUSCULAR | Status: AC
  Filled 2019-11-01: qty 2

## 2019-11-01 MED ORDER — SODIUM CHLORIDE 0.9 % IV SOLN: 250.0000 mL | INTRAVENOUS | Status: DC | PRN

## 2019-11-01 MED ORDER — HEPARIN (PORCINE) IN NACL 1000-0.9 UT/500ML-% IV SOLN
INTRAVENOUS | Status: DC | PRN
  Administered 2019-11-01: 500 mL

## 2019-11-01 MED ORDER — HEPARIN (PORCINE) IN NACL 1000-0.9 UT/500ML-% IV SOLN
INTRAVENOUS | Status: AC
  Filled 2019-11-01: qty 1000

## 2019-11-01 MED ORDER — IOHEXOL 300 MG/ML  SOLN
INTRAMUSCULAR | Status: DC | PRN
  Administered 2019-11-01: 65 mL

## 2019-11-01 MED ORDER — SODIUM CHLORIDE 0.9% FLUSH: 3.0000 mL | INTRAVENOUS | Status: DC | PRN

## 2019-11-01 MED ORDER — SODIUM CHLORIDE 0.9 % WEIGHT BASED INFUSION
1.0000 mL/kg/h | INTRAVENOUS | Status: DC
  Administered 2019-11-01: 1 mL/kg/h via INTRAVENOUS

## 2019-11-01 MED ORDER — FENTANYL CITRATE (PF) 100 MCG/2ML IJ SOLN
INTRAMUSCULAR | Status: DC | PRN
  Administered 2019-11-01 (×2): 25 ug via INTRAVENOUS

## 2019-11-01 MED ORDER — SODIUM CHLORIDE 0.9% FLUSH: 3.0000 mL | Freq: Two times a day (BID) | INTRAVENOUS | Status: DC

## 2019-11-01 MED ORDER — ONDANSETRON HCL 4 MG/2ML IJ SOLN: 4.0000 mg | Freq: Four times a day (QID) | INTRAMUSCULAR | Status: DC | PRN

## 2019-11-01 MED ORDER — MIDAZOLAM HCL 2 MG/2ML IJ SOLN
INTRAMUSCULAR | Status: DC | PRN
  Administered 2019-11-01 (×2): 1 mg via INTRAVENOUS

## 2019-11-01 MED ORDER — ACETAMINOPHEN 325 MG PO TABS: 650.0000 mg | ORAL_TABLET | ORAL | Status: DC | PRN

## 2019-11-01 MED ORDER — SODIUM CHLORIDE 0.9 % WEIGHT BASED INFUSION: 1.0000 mL/kg/h | INTRAVENOUS | Status: DC

## 2019-11-01 MED ORDER — ASPIRIN 81 MG PO CHEW
CHEWABLE_TABLET | ORAL | Status: AC
  Administered 2019-11-01: 81 mg via ORAL
  Filled 2019-11-01: qty 1

## 2019-11-01 MED ORDER — SODIUM CHLORIDE 0.9 % WEIGHT BASED INFUSION: 3.0000 mL/kg/h | INTRAVENOUS | Status: AC

## 2019-11-01 MED ORDER — LABETALOL HCL 5 MG/ML IV SOLN: 10.0000 mg | INTRAVENOUS | Status: DC | PRN

## 2019-11-01 MED ORDER — FENTANYL CITRATE (PF) 100 MCG/2ML IJ SOLN
INTRAMUSCULAR | Status: AC
  Filled 2019-11-01: qty 2

## 2019-11-01 MED ORDER — ASPIRIN 81 MG PO CHEW: 81.0000 mg | CHEWABLE_TABLET | ORAL | Status: AC

## 2019-11-01 SURGICAL SUPPLY — 10 items
CATH INFINITI 5FR JL4 (CATHETERS) ×3 IMPLANT
CATH INFINITI JR4 5F (CATHETERS) ×3 IMPLANT
DEVICE CLOSURE MYNXGRIP 5F (Vascular Products) ×3 IMPLANT
GLIDESHEATH SLEND SS 6F .021 (SHEATH) IMPLANT
KIT MANI 3VAL PERCEP (MISCELLANEOUS) ×3 IMPLANT
NEEDLE PERC 18GX7CM (NEEDLE) ×3 IMPLANT
PACK CARDIAC CATH (CUSTOM PROCEDURE TRAY) ×3 IMPLANT
SHEATH AVANTI 5FR X 11CM (SHEATH) ×3 IMPLANT
WIRE GUIDERIGHT .035X150 (WIRE) ×3 IMPLANT
WIRE ROSEN-J .035X260CM (WIRE) IMPLANT

## 2019-11-02 ENCOUNTER — Encounter: Payer: Self-pay | Admitting: Cardiology

## 2019-12-08 ENCOUNTER — Emergency Department: Payer: Commercial Managed Care - PPO

## 2019-12-08 ENCOUNTER — Encounter: Payer: Self-pay | Admitting: Emergency Medicine

## 2019-12-08 ENCOUNTER — Other Ambulatory Visit: Payer: Self-pay

## 2019-12-08 ENCOUNTER — Emergency Department
Admission: EM | Admit: 2019-12-08 | Discharge: 2019-12-08 | Disposition: A | Discharge status: Home / Self Care | Payer: Commercial Managed Care - PPO | Attending: Emergency Medicine | Admitting: Emergency Medicine

## 2019-12-08 DIAGNOSIS — R0602 Shortness of breath: Secondary | ICD-10-CM | POA: Insufficient documentation

## 2019-12-08 DIAGNOSIS — R079 Chest pain, unspecified: Secondary | ICD-10-CM

## 2019-12-08 DIAGNOSIS — Z955 Presence of coronary angioplasty implant and graft: Secondary | ICD-10-CM | POA: Insufficient documentation

## 2019-12-08 DIAGNOSIS — R110 Nausea: Secondary | ICD-10-CM | POA: Diagnosis not present

## 2019-12-08 DIAGNOSIS — R0789 Other chest pain: Secondary | ICD-10-CM | POA: Diagnosis not present

## 2019-12-08 DIAGNOSIS — Z20822 Contact with and (suspected) exposure to covid-19: Secondary | ICD-10-CM | POA: Insufficient documentation

## 2019-12-08 DIAGNOSIS — Z7982 Long term (current) use of aspirin: Secondary | ICD-10-CM | POA: Diagnosis not present

## 2019-12-08 DIAGNOSIS — I2510 Atherosclerotic heart disease of native coronary artery without angina pectoris: Secondary | ICD-10-CM | POA: Insufficient documentation

## 2019-12-08 DIAGNOSIS — Z7902 Long term (current) use of antithrombotics/antiplatelets: Secondary | ICD-10-CM | POA: Diagnosis not present

## 2019-12-08 HISTORY — DX: Atherosclerotic heart disease of native coronary artery without angina pectoris: I25.10

## 2019-12-08 LAB — GLUCOSE, CAPILLARY: Glucose-Capillary: 111 mg/dL — ABNORMAL HIGH (ref 70–99)

## 2019-12-08 LAB — FIBRIN DERIVATIVES D-DIMER (ARMC ONLY): Fibrin derivatives D-dimer (ARMC): 523.24 ng/mL (FEU) — ABNORMAL HIGH (ref 0.00–499.00)

## 2019-12-08 LAB — BASIC METABOLIC PANEL
Anion gap: 8 (ref 5–15)
BUN: 14 mg/dL (ref 8–23)
CO2: 24 mmol/L (ref 22–32)
Calcium: 9.6 mg/dL (ref 8.9–10.3)
Chloride: 102 mmol/L (ref 98–111)
Creatinine, Ser: 0.74 mg/dL (ref 0.44–1.00)
GFR calc Af Amer: >60 mL/min (ref >60)
GFR calc non Af Amer: >60 mL/min (ref >60)
Glucose, Bld: 117 mg/dL — ABNORMAL HIGH (ref 70–99)
Potassium: 3.7 mmol/L (ref 3.5–5.1)
Sodium: 134 mmol/L — ABNORMAL LOW (ref 135–145)

## 2019-12-08 LAB — CBC
HCT: 37.2 % (ref 36.0–46.0)
Hemoglobin: 12.3 g/dL (ref 12.0–15.0)
MCH: 27 pg (ref 26.0–34.0)
MCHC: 33.1 g/dL (ref 30.0–36.0)
MCV: 81.8 fL (ref 80.0–100.0)
Platelets: 229 10*3/uL (ref 150–400)
RBC: 4.55 MIL/uL (ref 3.87–5.11)
RDW: 16.2 % — ABNORMAL HIGH (ref 11.5–15.5)
WBC: 7.6 10*3/uL (ref 4.0–10.5)
nRBC: 0 % (ref 0.0–0.2)

## 2019-12-08 LAB — SARS CORONAVIRUS 2 (TAT 6-24 HRS): SARS Coronavirus 2: NEGATIVE

## 2019-12-08 LAB — TROPONIN I (HIGH SENSITIVITY)
Troponin I (High Sensitivity): 6 ng/L (ref <18)
Troponin I (High Sensitivity): 3 ng/L (ref <18)

## 2019-12-08 IMAGING — CR DG CHEST 2V
1 series · 2 of 2 positions shown · non-contrast
Comparison: None.

CLINICAL DATA: Chest pain and shortness of breath

EXAM:
CHEST - 2 VIEW

[Series 1: dg chest 2 view · 0.14mm/px · 2 of 2 slices shown]
[im 1/2]
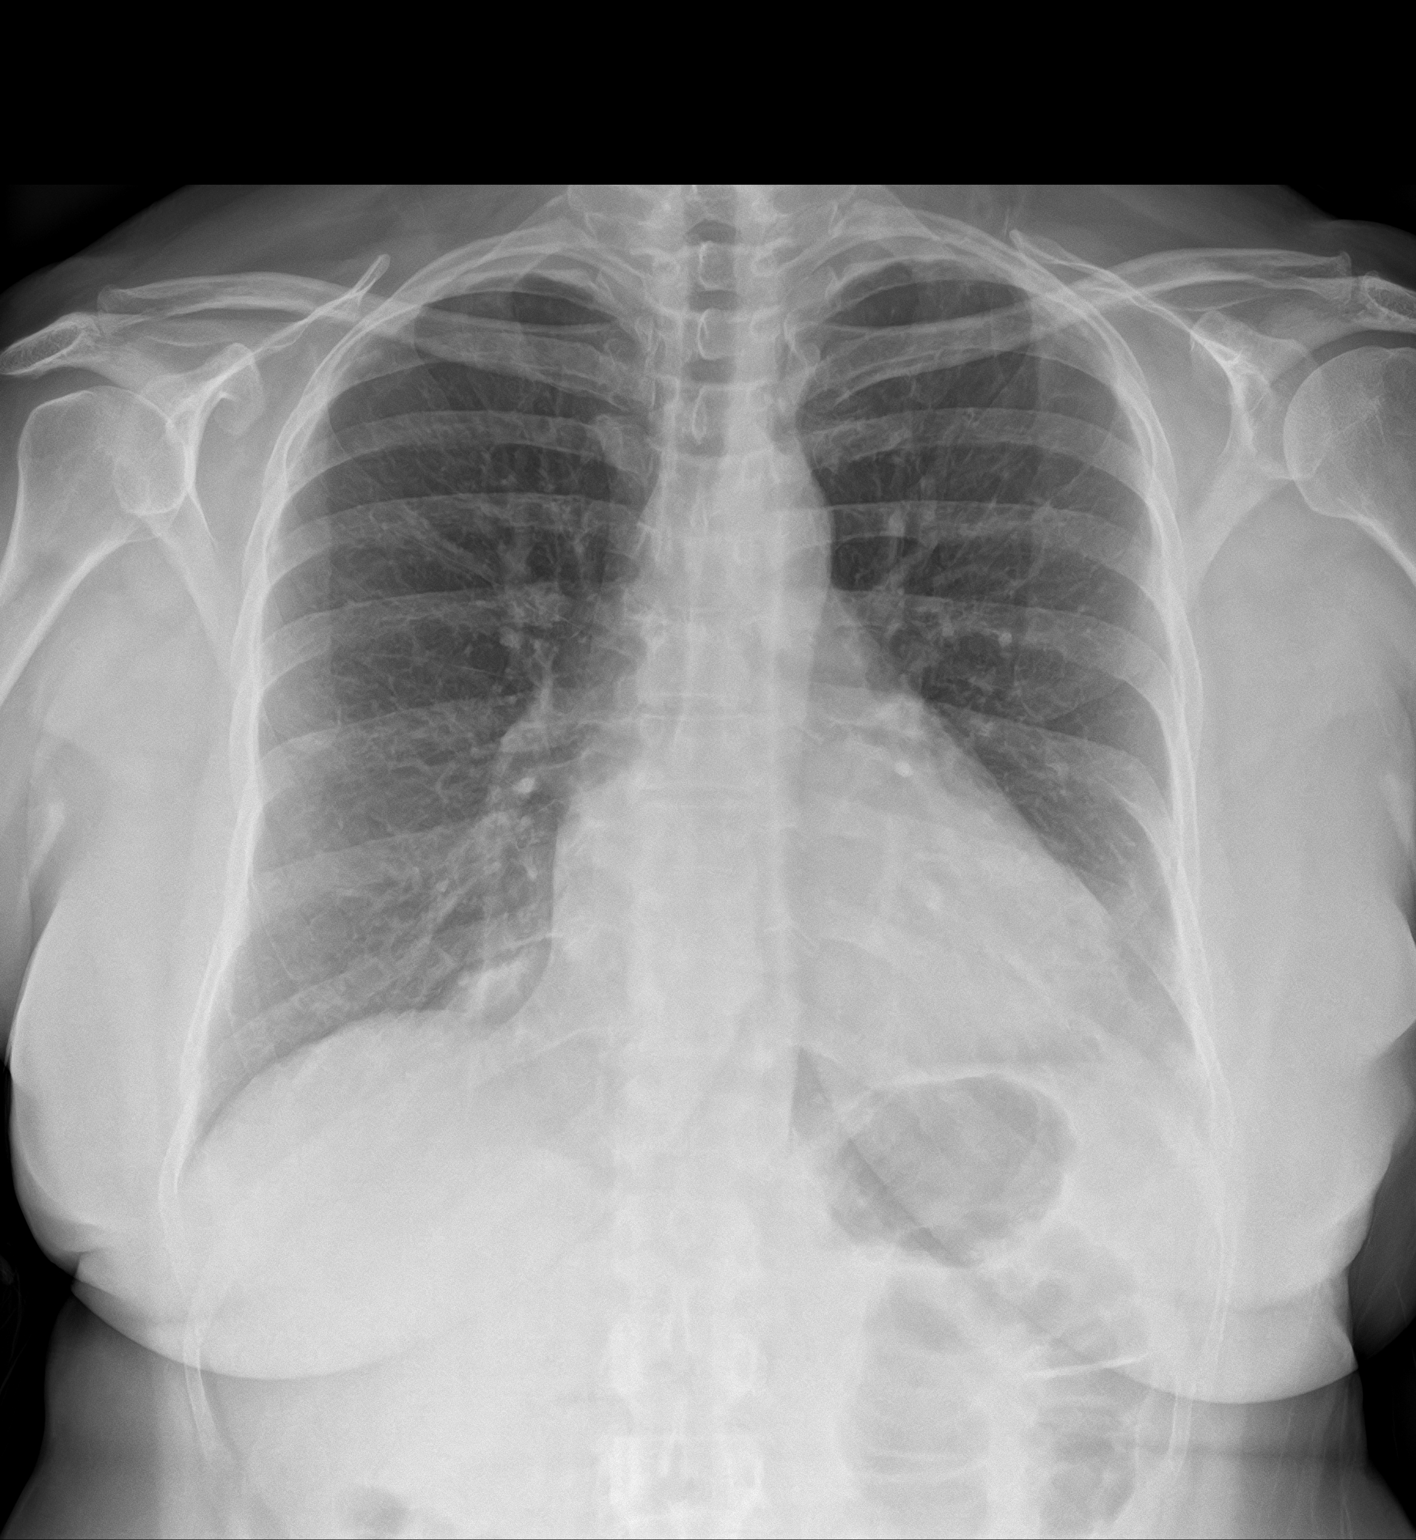
[im 2/2]
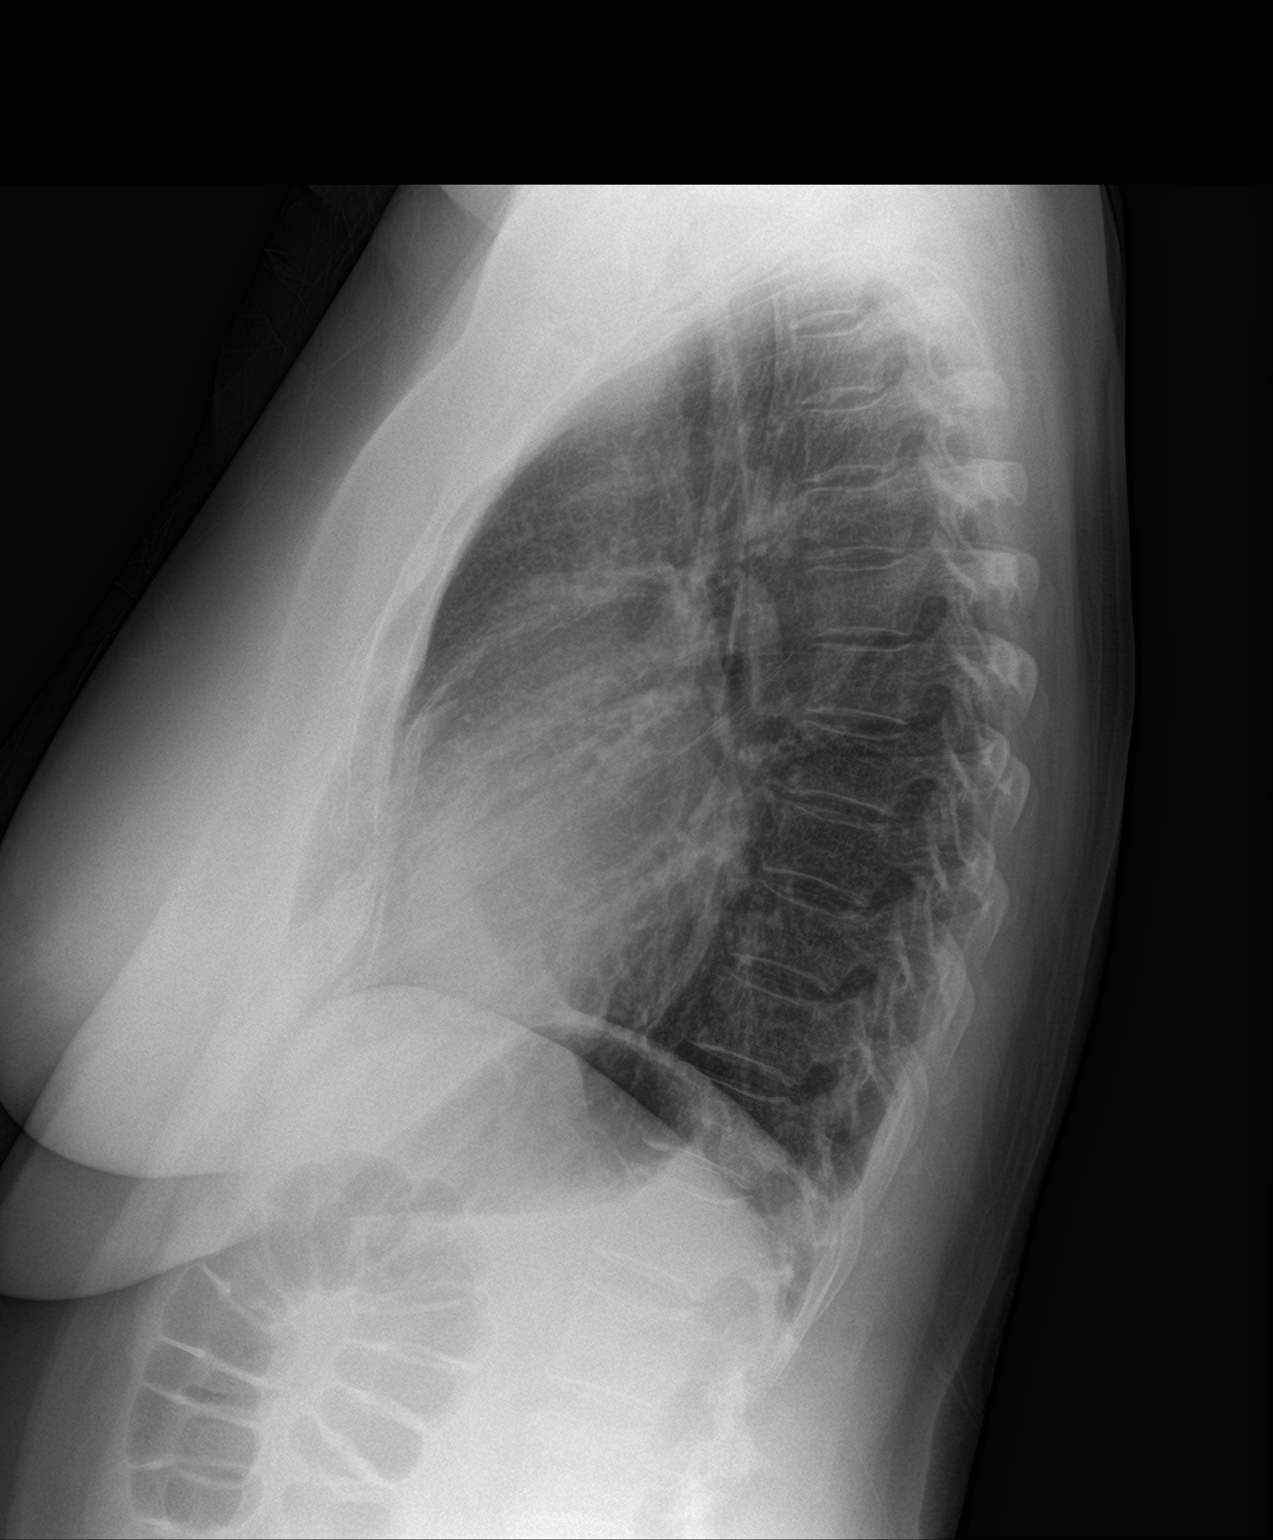

[2 of 2 positions shown; findings below may reference images not displayed]

FINDINGS: Lungs are clear. Heart size and pulmonary vascularity are normal. No
adenopathy. No pneumothorax. No bone lesions.
IMPRESSION: Lungs clear.  Cardiac silhouette within normal limits.

## 2019-12-08 IMAGING — CT CT ANGIO CHEST
2 of 6 series · 19 of 46 positions shown · IV contrast (APPLIED)
Comparison: None

CLINICAL DATA: Chest pain, shortness of breath since last night

EXAM:
CT ANGIOGRAPHY CHEST WITH CONTRAST
TECHNIQUE: Multidetector CT imaging of the chest was performed using the
standard protocol during bolus administration of intravenous
contrast. Multiplanar CT image reconstructions and MIPs were
obtained to evaluate the vascular anatomy.
CONTRAST:  75mL OMNIPAQUE IOHEXOL 350 MG/ML SOLN

[Series 5: thins · axial · 0.64mm/px · z∈[-233,+10]mm · 17 of 267 slices shown]
[im 12/267  lung]
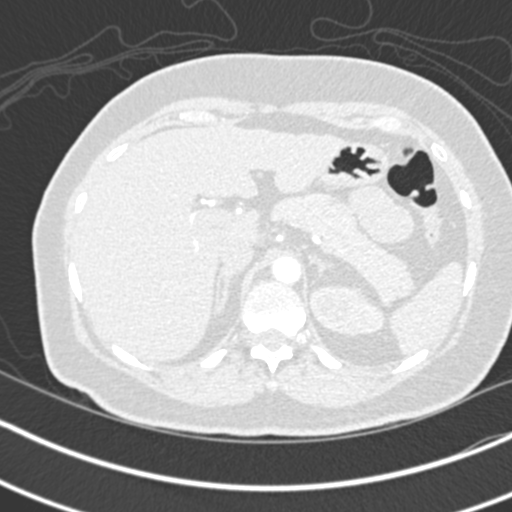
[im 24/267  soft-tissue]
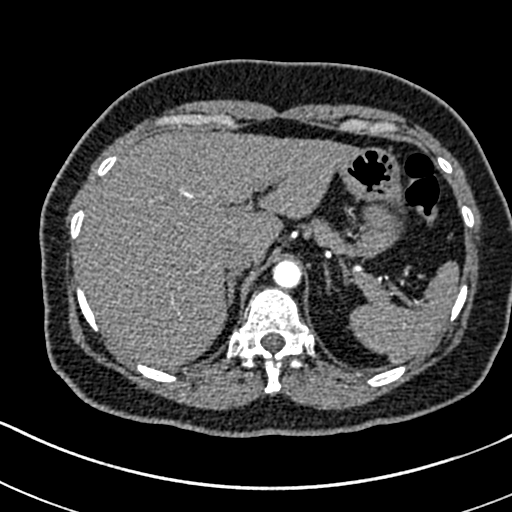
[im 47/267  lung]
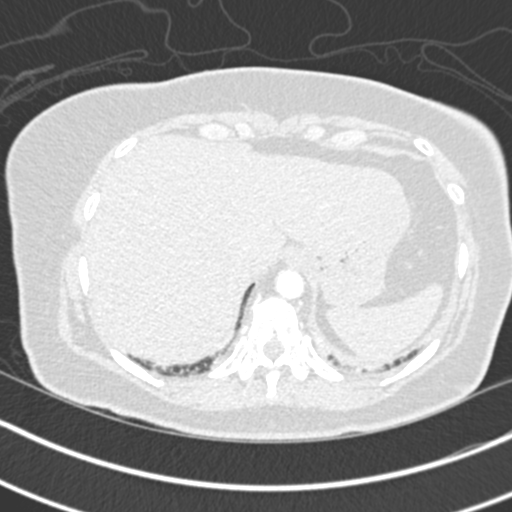
[im 58/267  soft-tissue]
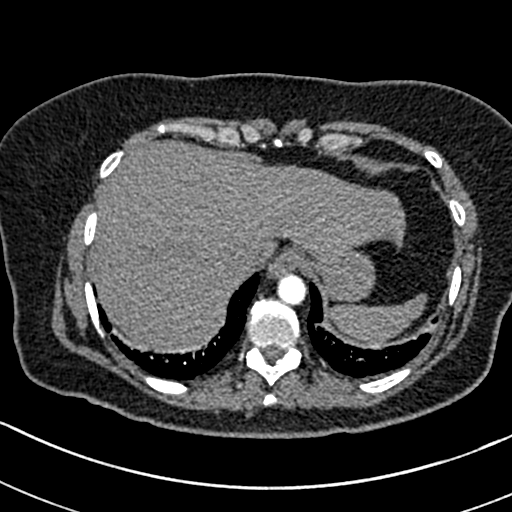
[im 70/267  lung]
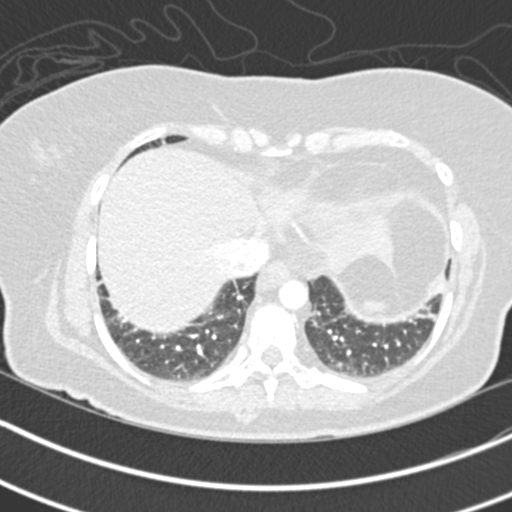
[im 93/267  soft-tissue]
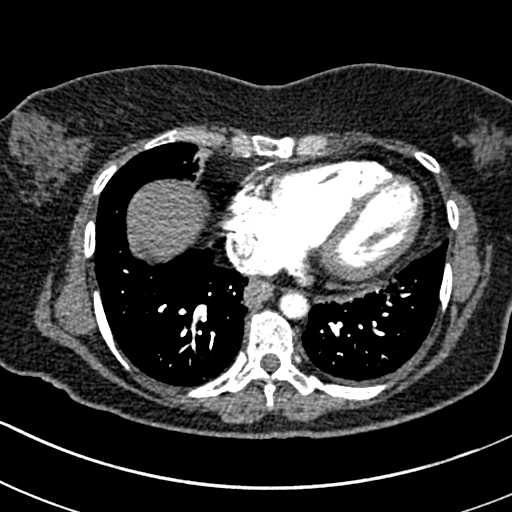
[im 105/267  lung]
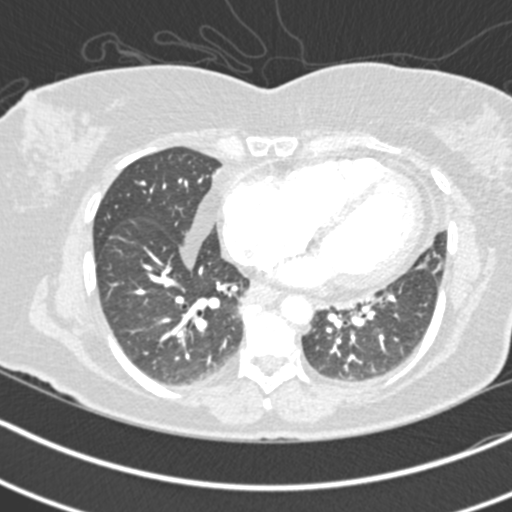
[im 116/267  soft-tissue]
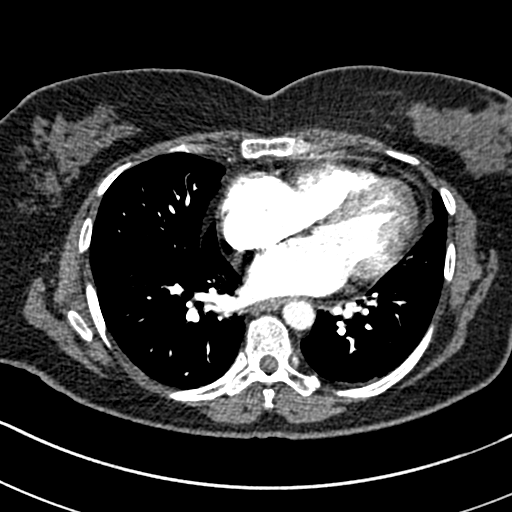
[im 139/267  lung]
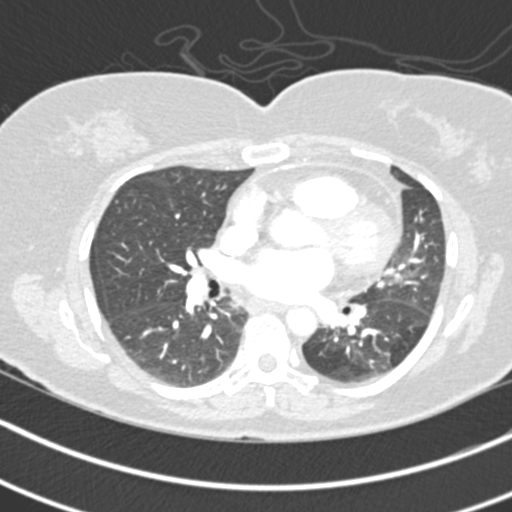
[im 151/267  soft-tissue]
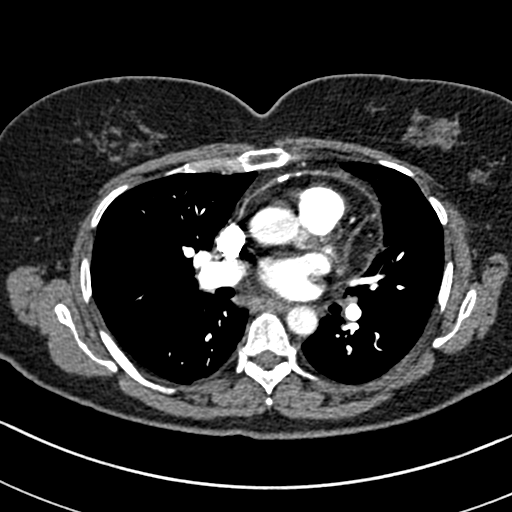
[im 162/267  lung]
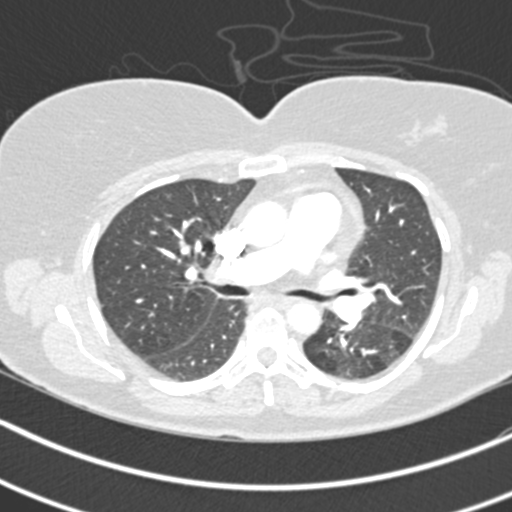
[im 174/267  soft-tissue]
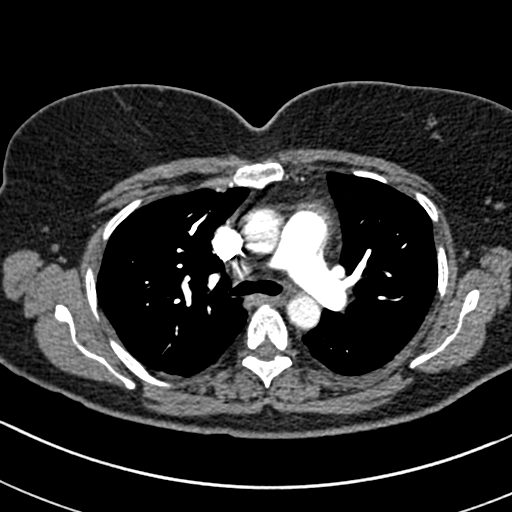
[im 197/267  lung]
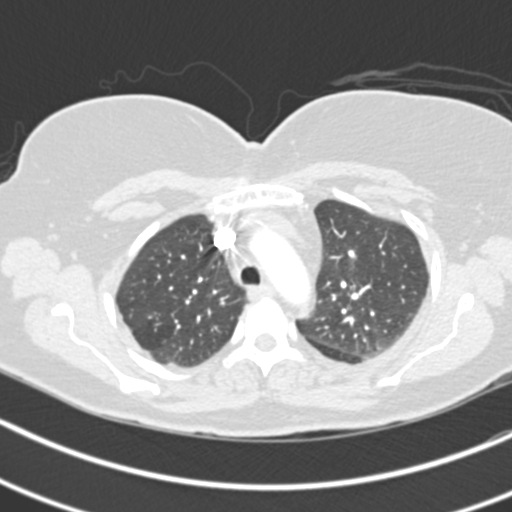
[im 209/267  soft-tissue]
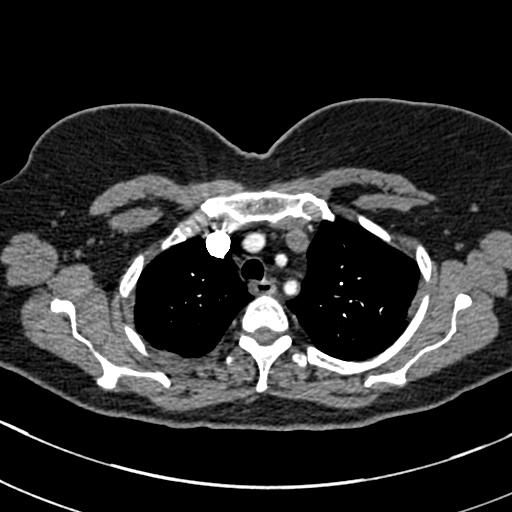
[im 220/267  lung]
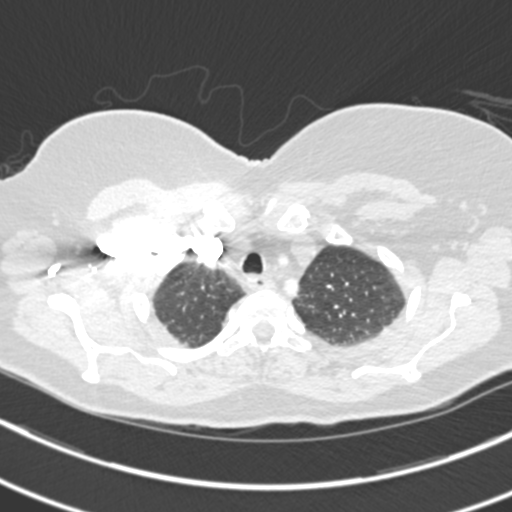
[im 243/267  soft-tissue]
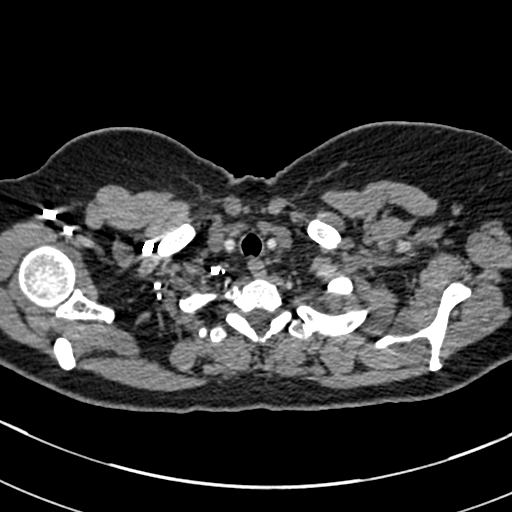
[im 255/267  lung]
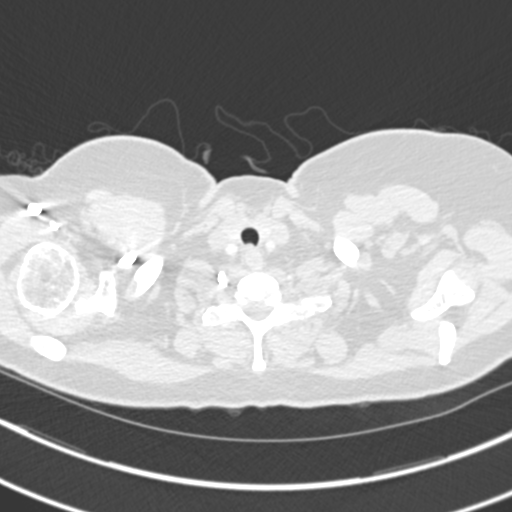

[Series 8: coronal mpr · coronal · 0.58mm/px · 2 of 84 slices shown]
[im 28/84  soft-tissue]
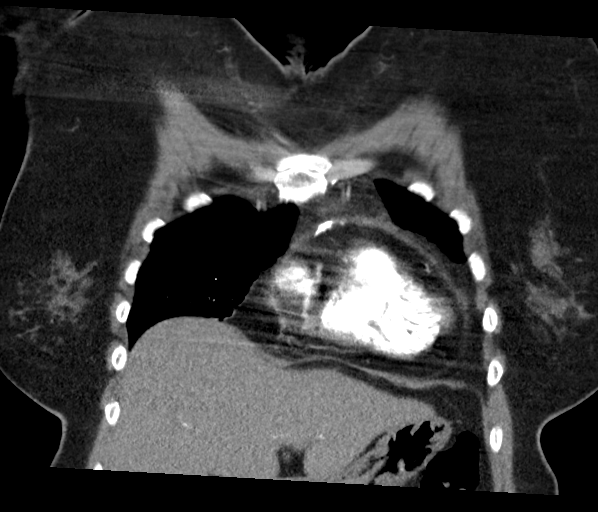
[im 56/84  soft-tissue]
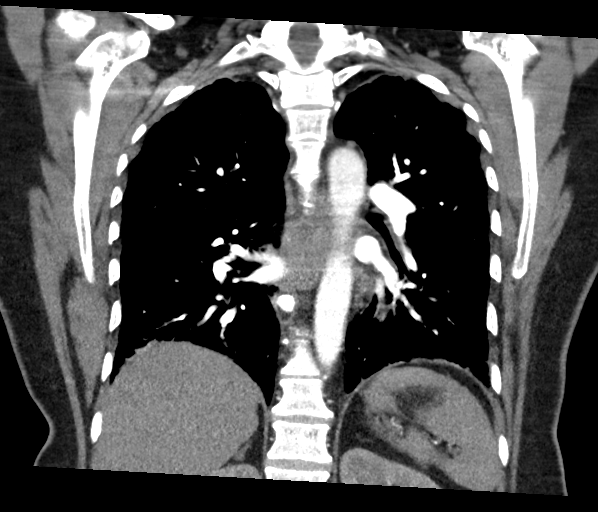

[19 of 46 positions shown; findings below may reference images not displayed]

FINDINGS: Cardiovascular: Subtle calcification along the pericardium along the
anterior pericardium. No focal pericardial thickening. No
significant pericardial fluid. Heart size mildly enlarged.

Aortic caliber is normal. Signs of prior percutaneous coronary
intervention along the left anterior descending coronary artery.

No sign of pulmonary embolism.

Mediastinum/Nodes: Thoracic inlet structures are normal. No axillary
lymphadenopathy. No hilar lymphadenopathy. No mediastinal
lymphadenopathy.

Lungs/Pleura: Basilar atelectasis. Signs of mild air trapping are
suggested at the lung bases with mosaic attenuation. Airways are
patent. No consolidation. No pleural effusion.

Upper Abdomen: Incidental imaging of upper abdominal contents is
unremarkable.

Musculoskeletal: No acute bone finding, no chest wall mass.

Review of the MIP images confirms the above findings.
IMPRESSION: 1. Negative for pulmonary embolism.
2. Signs of mild air trapping at the lung bases with mosaic
attenuation.
3. Signs of prior percutaneous coronary intervention along the left
anterior descending coronary artery.

## 2019-12-08 MED ORDER — IOHEXOL 350 MG/ML SOLN
75.0000 mL | Freq: Once | INTRAVENOUS | Status: AC | PRN
  Administered 2019-12-08: 75 mL via INTRAVENOUS

## 2019-12-08 MED ORDER — NITROGLYCERIN 0.4 MG SL SUBL
0.4000 mg | SUBLINGUAL_TABLET | SUBLINGUAL | Status: DC | PRN
  Administered 2019-12-08: 12:00:00 0.4 mg via SUBLINGUAL
  Filled 2019-12-08: qty 1

## 2019-12-08 MED ORDER — SODIUM CHLORIDE 0.9% FLUSH: 3.0000 mL | Freq: Once | INTRAVENOUS | Status: DC

## 2019-12-08 MED ORDER — ASPIRIN EC 81 MG PO TBEC
162.0000 mg | DELAYED_RELEASE_TABLET | Freq: Once | ORAL | Status: AC
  Administered 2019-12-08: 162 mg via ORAL
  Filled 2019-12-08: qty 2

## 2019-12-08 NOTE — ED Notes (Signed)
Pt reports no improvement of pain after 1st nitro. Pt wishes to hold on last two doses of nitro at this time. This RN will continue to monitor.

## 2019-12-08 NOTE — Discharge Instructions (Addendum)

## 2019-12-08 NOTE — ED Triage Notes (Signed)
Pt to ED via POV c/o chest pain and shortness of breath since last night. Pt states that pain has been consistent but gets worse at time. Pain is worse when taking a deep breath. Pt states that pain is all across her chest. Pt is in NAD.

## 2019-12-08 NOTE — ED Provider Notes (Signed)
[facility] Emergency Department Provider Note   ____________________________________________   First MD Initiated Contact with Patient 12/08/19 1114     (approximate)  I have reviewed the triage vital signs and the nursing notes.   HISTORY  Chief Complaint Chest Pain    HPI Renee Stephens is a 63 y.o. female history of coronary disease  Patient reports that since last night she has been experiencing a pressure feeling in her left upper chest.  It radiates somewhat to her left arm.  It is however worsened by deep breathing.  She reports that this does not feel like the pain that she is had with her previous heart attacks.  Some mild.  A little bit of nausea with it.  A little bit of shortness of breath.  No fevers or chills.  She has not been immunized against Covid yet.  She denies Covid exposure.  No cough.  No fevers no chills no abdominal pain.  Does not radiate to her back.  She took nitroglycerin at home that provided some relief overnight.   Past Medical History:  Diagnosis Date  . Coronary artery disease     Patient Active Problem List   Diagnosis Date Noted  . S/P drug eluting coronary stent placement 12/28/2018  . Atrophic vaginitis 08/11/2017  . Essential tremor 07/29/2016  . Prediabetes 07/29/2016  . Anxiety 05/31/2015  . Menopausal syndrome (hot flashes) 05/31/2015  . Osteopenia 05/31/2015  . Restless legs 05/31/2015    Past Surgical History:  Procedure Laterality Date  . ABDOMINAL HYSTERECTOMY  1995  . CORONARY STENT INTERVENTION N/A 12/28/2018   Procedure: CORONARY STENT INTERVENTION;  Surgeon: Yolonda Kida, MD;  Location: [place] [facility];  Service: Cardiovascular;  Laterality: N/A;  . LEFT HEART CATH AND CORONARY ANGIOGRAPHY Left 12/28/2018   Procedure: LEFT HEART CATH AND CORONARY ANGIOGRAPHY;  Surgeon: Yolonda Kida, MD;  Location: [place] [facility];  Service: Cardiovascular;  Laterality: Left;  .  LEFT HEART CATH AND CORONARY ANGIOGRAPHY N/A 11/01/2019   Procedure: LEFT HEART CATH AND CORONARY ANGIOGRAPHY;  Surgeon: Yolonda Kida, MD;  Location: [place] [facility];  Service: Cardiovascular;  Laterality: N/A;    Prior to Admission medications   Medication Sig Start Date End Date Taking? Authorizing Provider  acetaminophen (TYLENOL) 500 MG tablet Take 500 mg by mouth every 8 (eight) hours as needed for moderate pain or headache.    [provider]  aspirin EC 81 MG tablet Take 81 mg by mouth daily.    [provider]  clopidogrel (PLAVIX) 75 MG tablet Take 75 mg by mouth daily.    [provider]  docusate sodium (COLACE) 100 MG capsule Take 100 mg by mouth daily as needed for mild constipation.    [provider]  FLUoxetine (PROZAC) 20 MG capsule Take 20 mg by mouth daily.     [provider]  furosemide (LASIX) 20 MG tablet Take 20 mg by mouth daily as needed for edema. 05/10/19   [provider]  isosorbide mononitrate (IMDUR) 30 MG 24 hr tablet Take 30 mg by mouth daily.    [provider]  metoprolol succinate (TOPROL-XL) 25 MG 24 hr tablet Take 25 mg by mouth daily.    [provider]  primidone (MYSOLINE) 50 MG tablet Take 50 mg by mouth daily.    [provider]  rOPINIRole (REQUIP) 1 MG tablet Take 2 mg by mouth See admin instructions. Take 2 mg  in the evening and 2 mg at bedtime    [provider]  rosuvastatin (CRESTOR) 40 MG tablet Take 1 tablet (40 mg total) by mouth daily at 6 PM. 12/28/18   Callwood, Dwayne D, MD  ticagrelor (BRILINTA) 90 MG TABS tablet Take 1 tablet (90 mg total) by mouth 2 (two) times daily. Patient not taking: Reported on 10/25/2019 12/28/18   Lujean Amel D, MD    Allergies Penicillin g  Family History  Problem Relation Age of Onset  . Kidney failure Father   . Kidney failure Mother   . Breast cancer Neg Hx   . Bladder Cancer Neg Hx     Social  History Social History   Tobacco Use  . Smoking status: Never Smoker  . Smokeless tobacco: Never Used  Substance Use Topics  . Alcohol use: No  . Drug use: No    Review of Systems Constitutional: No fever/chills Eyes: No visual changes. ENT: No sore throat. Cardiovascular: See HPI.  Pain worsens with taking deep breaths.  Feels like a somewhat sharp pain with deep breaths but otherwise feels like a slight tightness in the left upper chest. Respiratory: Denies shortness of breath. Gastrointestinal: No abdominal pain.   Genitourinary: Negative for dysuria. Musculoskeletal: Negative for back pain. Skin: Negative for rash. Neurological: Negative for headaches, areas of focal weakness or numbness.    ____________________________________________   PHYSICAL EXAM:  VITAL SIGNS: ED Triage Vitals  Enc Vitals Group     BP 12/08/19 0833 128/77     Pulse Rate 12/08/19 0833 73     Resp 12/08/19 0833 16     Temp 12/08/19 0833 98.4 F (36.9 C)     Temp Source 12/08/19 0833 Oral     SpO2 12/08/19 0833 100 %     Weight 12/08/19 0834 185 lb (83.9 kg)     Height 12/08/19 0834 5\' 6"  (1.676 m)     Head Circumference --      Peak Flow --      Pain Score 12/08/19 0834 7     Pain Loc --      Pain Edu? --      Excl. in [place]? --     Constitutional: Alert and oriented. Well appearing and in no acute distress. Eyes: Conjunctivae are normal. Head: Atraumatic. Nose: No congestion/rhinnorhea. Mouth/Throat: Mucous membranes are moist. Neck: No stridor.  Cardiovascular: Normal rate, regular rhythm. Grossly normal heart sounds.  Good peripheral circulation.  No reproducible chest pain to palpation of the chest wall or use of the arms. Respiratory: Normal respiratory effort.  No retractions. Lungs CTAB. Gastrointestinal: Soft and nontender. No distention. Musculoskeletal: No lower extremity tenderness nor edema. Neurologic:  Normal speech and language. No gross focal neurologic deficits are  appreciated.  Skin:  Skin is warm, dry and intact. No rash noted. Psychiatric: Mood and affect are normal. Speech and behavior are normal.  ____________________________________________   LABS (all labs ordered are listed, but only abnormal results are displayed)  Labs Reviewed  GLUCOSE, CAPILLARY - Abnormal; Notable for the following components:      Result Value   Glucose-Capillary 111 (*)    All other components within normal limits  BASIC METABOLIC PANEL - Abnormal; Notable for the following components:   Sodium 134 (*)    Glucose, Bld 117 (*)    All other components within normal limits  CBC - Abnormal; Notable for the following components:   RDW 16.2 (*)    All other components within  normal limits  FIBRIN DERIVATIVES D-DIMER ([facility] ONLY) - Abnormal; Notable for the following components:   Fibrin derivatives D-dimer ([facility]) 523.24 (*)    All other components within normal limits  SARS CORONAVIRUS 2 (TAT 6-24 HRS)  TROPONIN I (HIGH SENSITIVITY)  TROPONIN I (HIGH SENSITIVITY)   ____________________________________________  EKG  Reviewed inter by me at 8:30 AM Heart rate 89 QRS 110 QTc 440 Normal sinus rhythm, incomplete right bundle branch block.  There is T wave depression and inversion in inferior leads as well as some noted in V4 V5  EKG changes seem more prominent today than previous EKG from December 29, 2018.   Please note I discussed and Dr. Nehemiah Massed reviewed today's EKG, he made comparison to others that he has and reports that there is minimal if any change from the patient's most recent baseline 12 leads ____________________________________________  RADIOLOGY  DG Chest 2 View  Result Date: 12/08/2019 CLINICAL DATA:  Chest pain and shortness of breath EXAM: CHEST - 2 VIEW COMPARISON:  None. FINDINGS: Lungs are clear. Heart size and pulmonary vascularity are normal. No adenopathy. No pneumothorax. No bone lesions. IMPRESSION: Lungs clear.  Cardiac silhouette within  normal limits. Electronically Signed   By: Lowella Grip III M.D.   On: 12/08/2019 09:28   CT Angio Chest PE W and/or Wo Contrast  Result Date: 12/08/2019 CLINICAL DATA:  Chest pain, shortness of breath since last night EXAM: CT ANGIOGRAPHY CHEST WITH CONTRAST TECHNIQUE: Multidetector CT imaging of the chest was performed using the standard protocol during bolus administration of intravenous contrast. Multiplanar CT image reconstructions and MIPs were obtained to evaluate the vascular anatomy. CONTRAST:  49mL OMNIPAQUE IOHEXOL 350 MG/ML SOLN COMPARISON:  None FINDINGS: Cardiovascular: Subtle calcification along the pericardium along the anterior pericardium. No focal pericardial thickening. No significant pericardial fluid. Heart size mildly enlarged. Aortic caliber is normal. Signs of prior percutaneous coronary intervention along the left anterior descending coronary artery. No sign of pulmonary embolism. Mediastinum/Nodes: Thoracic inlet structures are normal. No axillary lymphadenopathy. No hilar lymphadenopathy. No mediastinal lymphadenopathy. Lungs/Pleura: Basilar atelectasis. Signs of mild air trapping are suggested at the lung bases with mosaic attenuation. Airways are patent. No consolidation. No pleural effusion. Upper Abdomen: Incidental imaging of upper abdominal contents is unremarkable. Musculoskeletal: No acute bone finding, no chest wall mass. Review of the MIP images confirms the above findings. IMPRESSION: 1. Negative for pulmonary embolism. 2. Signs of mild air trapping at the lung bases with mosaic attenuation. 3. Signs of prior percutaneous coronary intervention along the left anterior descending coronary artery. Electronically Signed   By: Zetta Bills M.D.   On: 12/08/2019 13:44    CT scan reviewed negative for PE.  Possible mild air trapping. (Clinically, does not seem to correlate with air trapping or reactive airway disease at this  time) ____________________________________________   PROCEDURES  Procedure(s) performed: 3-lead  .1-3 Lead EKG Interpretation Performed by: Delman Kitten, MD Authorized by: Delman Kitten, MD     Interpretation: normal     ECG rate:  70   ECG rate assessment: normal     Rhythm: sinus rhythm     Ectopy: none     Conduction: normal      Critical Care performed: No  ____________________________________________   INITIAL IMPRESSION / ASSESSMENT AND PLAN / ED COURSE  Pertinent labs & imaging results that were available during my care of the patient were reviewed by me and considered in my medical decision making (see chart for details).  Differential diagnosis includes, but is not limited to, ACS, aortic dissection, pulmonary embolism, cardiac tamponade, pneumothorax, pneumonia, pericarditis, myocarditis, GI-related causes including esophagitis/gastritis, and musculoskeletal chest wall pain.      Clinical Course as of Dec 07 1498  Fri Dec 08, 2019  1115 Prior heart cath: Normal left ventricular function ejection fraction of 60% Left main large widely patent minor irregularities distally LAD large with a widely patent proximal stent Diagonal 1 with 50% ostially Diagonal 2 with 40% ostially Circumflex large no significant disease RCA large no disease Right femoral artery closure with a mynx Recommend conservative therapy   [MQ]  1127 Paged [facility] cardiology re: chest pain, ekg changes?   [MQ]  [id number] Today's EKG reviewed with Dr. Nehemiah Massed and discussed the patient's clinical presentation.  Patient well-known to [facility] cardiology.  He was able to review old EKGs as well as today's, reports that today's EKG changes are slightly more prominent but in similar distribution as seen previous.  He advises a work-up including second troponin, if negative would anticipate discharge with close follow-up   [MQ]    Clinical Course User Index [MQ] Delman Kitten, MD    ----------------------------------------- 1:23 PM on 12/08/2019 -----------------------------------------  Patient reports she is feeling improved.  Resting comfortably at this time.  D-dimer minimally elevated, have ordered CT chest to evaluate and exclude PE and other acute chest causes.  Overall very reassuring that her troponins remain normal, her EKG after discussion with cardiology is with minimal if any change from their previous [place] clinic notes and EKGs.  Cardiology recommending outpatient follow-up   HEAR Score: 5   ----------------------------------------- 3:00 PM on 12/08/2019 -----------------------------------------  Patient resting comfortably.  Reports her symptoms seem to have improved quite a bit and almost gone away.  She appears well.  Discussed careful return precautions, follow-up with cardiology.  Patient comfortable with plan  Return precautions and treatment recommendations and follow-up discussed with the patient who is agreeable with the plan.  ____________________________________________   FINAL CLINICAL IMPRESSION(S) / ED DIAGNOSES  Final diagnoses:  Chest pain, moderate coronary artery risk        Note:  This document was prepared using Dragon voice recognition software and may include unintentional dictation errors       Delman Kitten, MD 12/08/19 1500

## 2020-01-12 ENCOUNTER — Other Ambulatory Visit (HOSPITAL_COMMUNITY): Payer: Self-pay | Admitting: Neurology

## 2020-01-12 ENCOUNTER — Other Ambulatory Visit: Payer: Self-pay | Admitting: Neurology

## 2020-01-12 DIAGNOSIS — M79609 Pain in unspecified limb: Secondary | ICD-10-CM

## 2020-01-12 DIAGNOSIS — R202 Paresthesia of skin: Secondary | ICD-10-CM

## 2020-01-25 ENCOUNTER — Ambulatory Visit
Admission: RE | Admit: 2020-01-25 | Discharge: 2020-01-25 | Disposition: A | Discharge status: Home / Self Care | Payer: Commercial Managed Care - PPO | Source: Ambulatory Visit | Attending: Neurology | Admitting: Neurology

## 2020-01-25 ENCOUNTER — Other Ambulatory Visit: Payer: Self-pay

## 2020-01-25 DIAGNOSIS — R202 Paresthesia of skin: Secondary | ICD-10-CM | POA: Diagnosis present

## 2020-01-25 DIAGNOSIS — M79609 Pain in unspecified limb: Secondary | ICD-10-CM

## 2020-01-25 IMAGING — MR MR CERVICAL SPINE W/O CM
5 series · 38 of 48 positions shown · non-contrast
Comparison: None.

CLINICAL DATA: Left neck pain radiating into the left arm with
pain, tingling and numbness. No acute injury or prior relevant
surgery.

EXAM:
MRI CERVICAL SPINE WITHOUT CONTRAST
TECHNIQUE: Multiplanar, multisequence MR imaging of the cervical spine was
performed. No intravenous contrast was administered.

[Series 5: T2 · sagittal · 3.0mm · 0.62mm/px · 6 of 15 slices shown (1 of 2)]
[im 1/15]
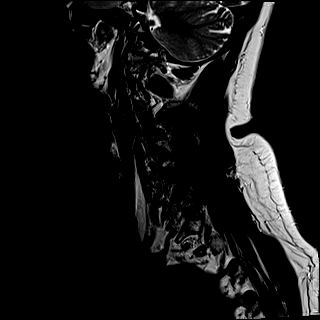
[im 3/15]
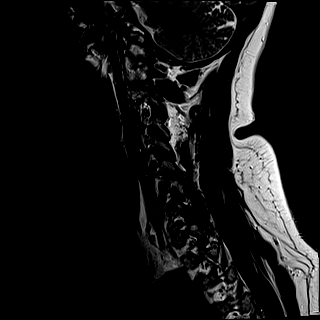
[im 6/15]
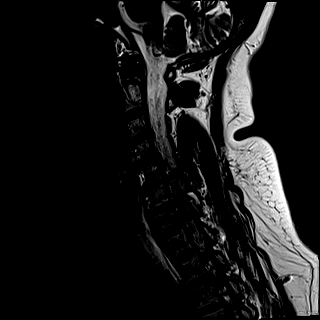
[im 9/15]
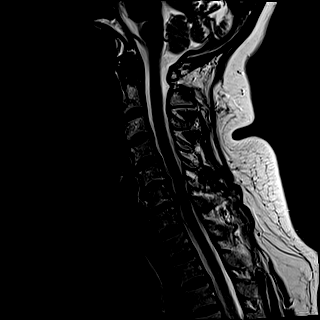
[im 12/15]
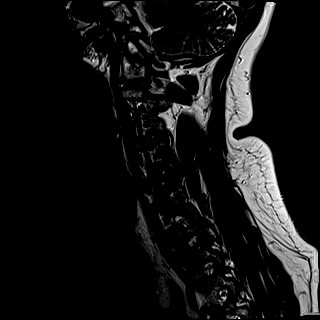
[im 15/15]
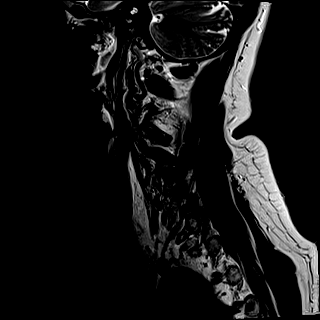

[Series 6: FLAIR · sagittal · 3.0mm · 0.78mm/px · 7 of 15 slices shown]
[im 1/15]
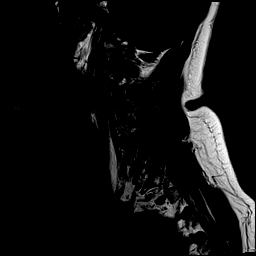
[im 3/15]
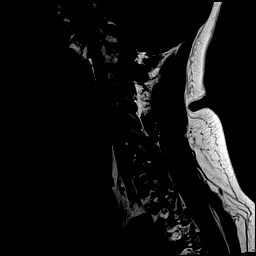
[im 5/15]
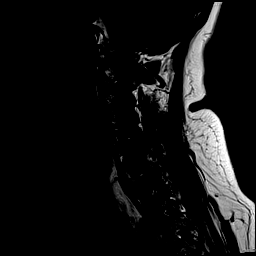
[im 8/15]
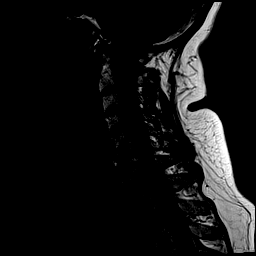
[im 10/15]
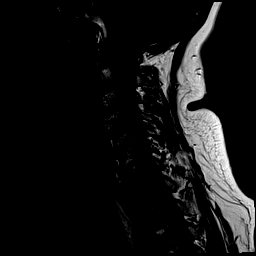
[im 12/15]
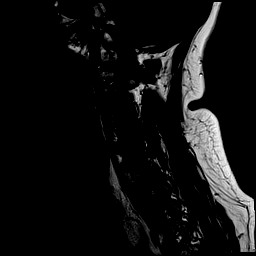
[im 15/15]
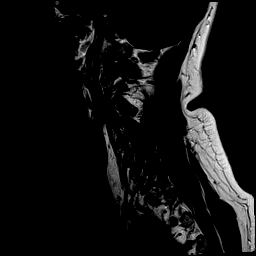

[Series 7: STIR · sagittal · 3.0mm · 0.62mm/px · 7 of 15 slices shown]
[im 1/15]
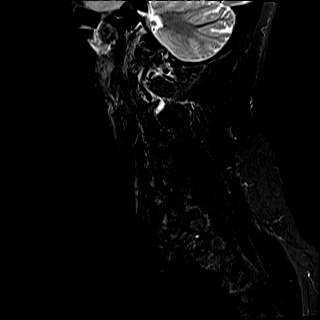
[im 3/15]
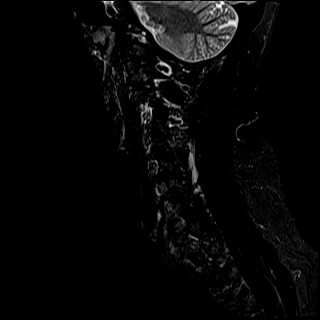
[im 5/15]
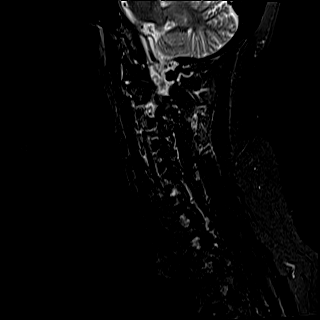
[im 8/15]
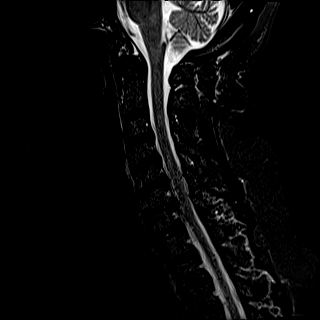
[im 10/15]
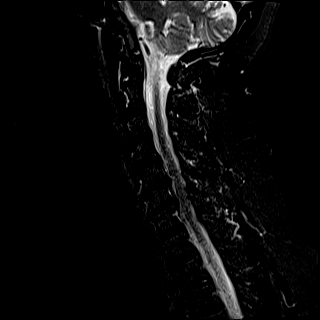
[im 12/15]
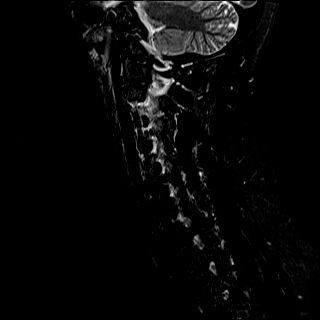
[im 15/15]
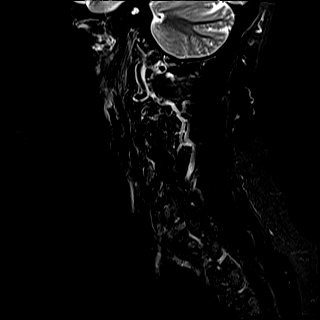

[Series 8: T2 · axial · 3.0mm · 0.70mm/px · z∈[-149,-54]mm · 10 of 29 slices shown (2 of 2)]
[im 1/29]
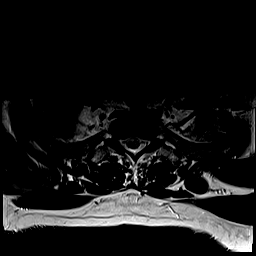
[im 3/29]
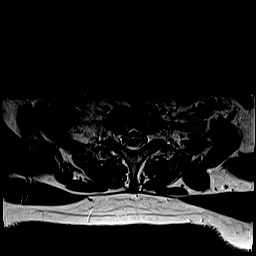
[im 5/29]
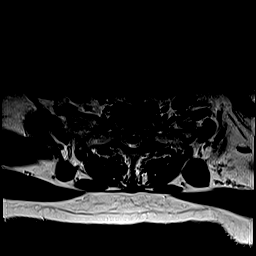
[im 7/29]
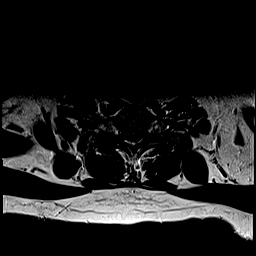
[im 9/29]
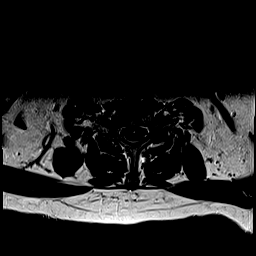
[im 13/29]
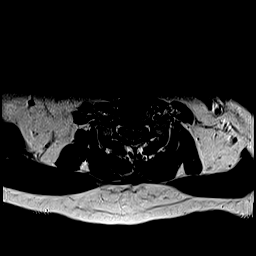
[im 16/29]
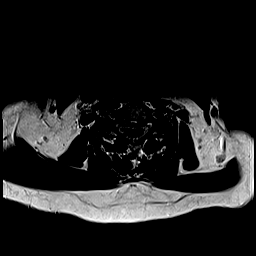
[im 20/29]
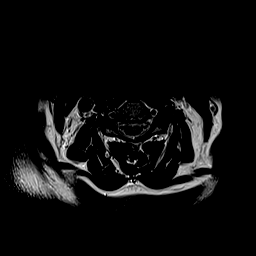
[im 24/29]
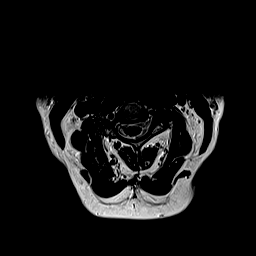
[im 29/29]
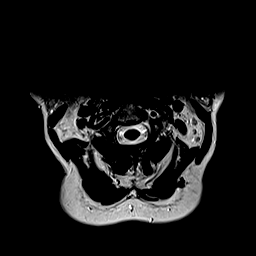

[Series 9: ax mpgr · axial · 3.0mm · 0.35mm/px · z∈[-149,-54]mm · 8 of 29 slices shown]
[im 1/29]
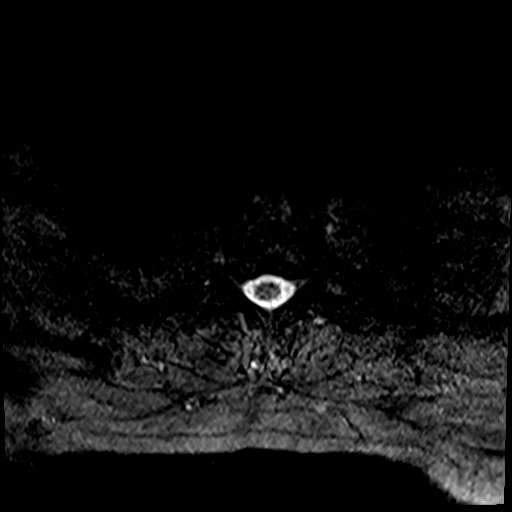
[im 5/29]
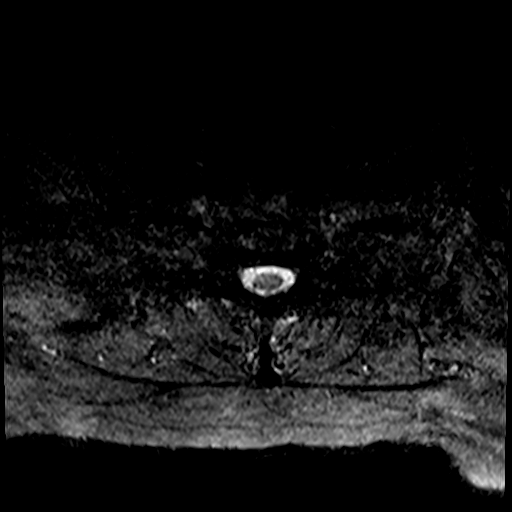
[im 9/29]
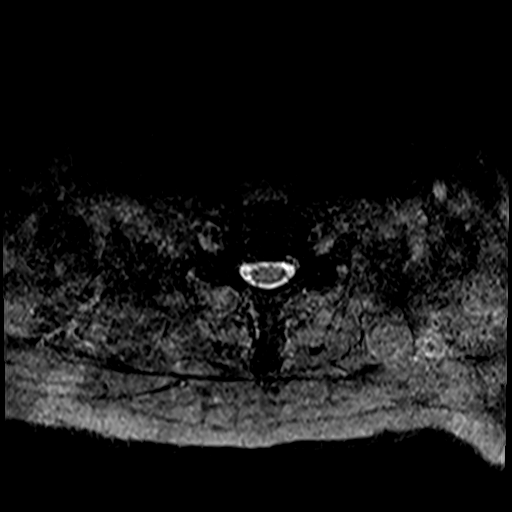
[im 13/29]
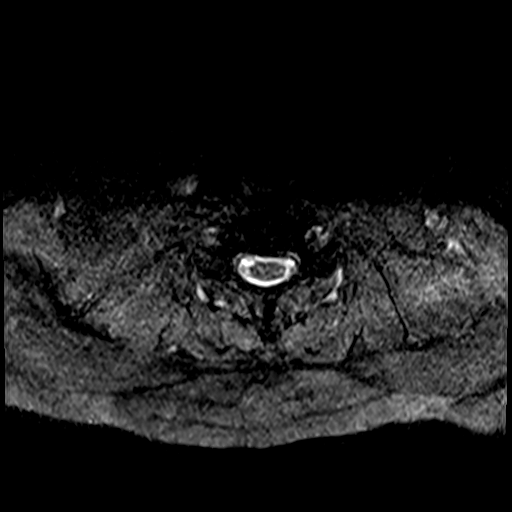
[im 16/29]
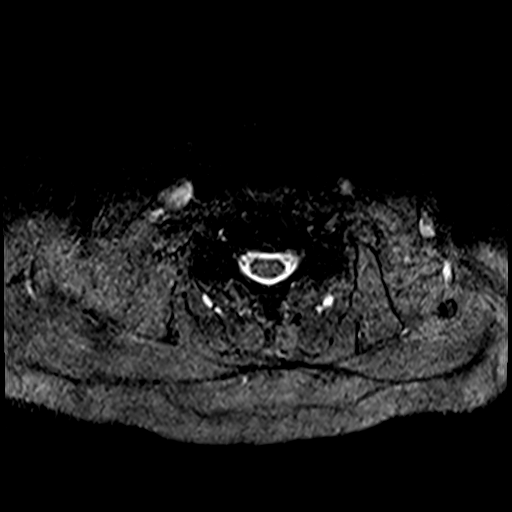
[im 20/29]
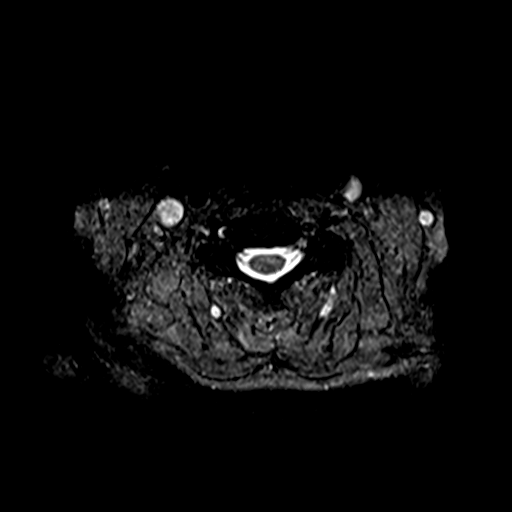
[im 24/29]
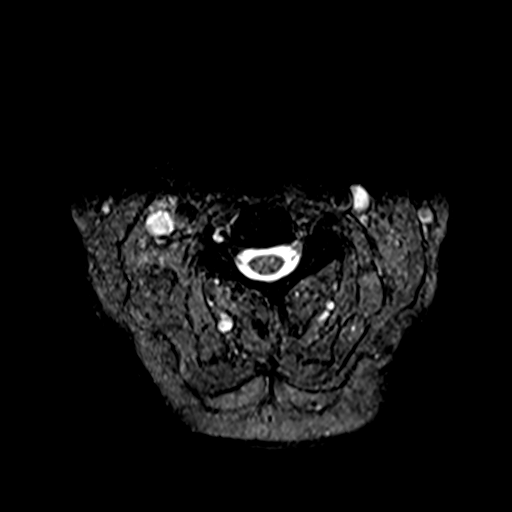
[im 29/29]
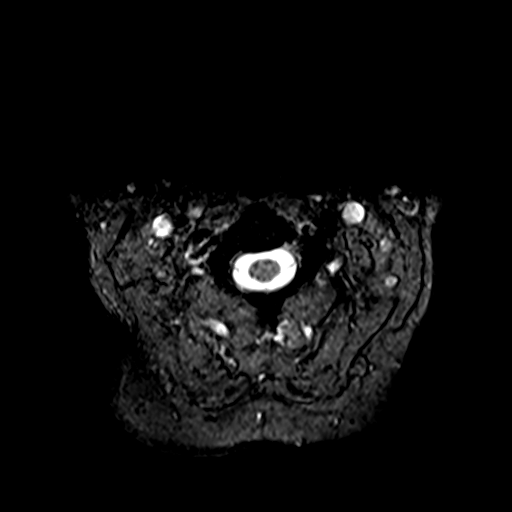

[38 of 48 positions shown; findings below may reference images not displayed]

FINDINGS: Alignment: Straightening without focal angulation or listhesis.

Vertebrae: No acute or suspicious osseous findings.

Cord: Normal in signal and caliber.

Posterior Fossa, vertebral arteries, paraspinal tissues: Visualized
portions of the posterior fossa and paraspinal soft tissues appear
unremarkable. Bilateral vertebral artery flow voids.

Disc levels:

C2-3: Normal interspace.

C3-4: Mild uncinate spurring with asymmetric facet hypertrophy on
the left. Mild foraminal narrowing bilaterally. No cord deformity.

C4-5: Mild disc bulging, uncinate spurring and bilateral facet
hypertrophy. Mild foraminal narrowing bilaterally. No cord
deformity.

C5-6: Spondylosis with loss of disc height and posterior osteophytes
covering diffusely bulging disc material. There is asymmetric
uncinate spurring on the left and bilateral facet hypertrophy. The
CSF surrounding the cord is partially effaced. There is no cord
deformity. Mild right and moderate left foraminal narrowing.

C6-7: Spondylosis with loss of disc height and posterior osteophytes
covering diffusely bulging disc material. Asymmetric uncinate
spurring on the right contributes to moderate right foraminal
narrowing. The left foramen is only mildly narrowed. The CSF
surrounding the cord is partially effaced without cord deformity.

C7-T1: The disc appears normal. Mild bilateral facet hypertrophy. No
spinal stenosis or nerve root encroachment.
IMPRESSION: 1. Multilevel cervical spondylosis as described. There is mild
spinal stenosis at C5-6 and C6-7. No cord deformity or abnormal cord
signal.
2. Moderate foraminal narrowing on the left at C5-6 and on the right
at C6-7.

## 2020-04-16 ENCOUNTER — Other Ambulatory Visit: Payer: Self-pay | Admitting: Internal Medicine

## 2020-04-16 DIAGNOSIS — Z1231 Encounter for screening mammogram for malignant neoplasm of breast: Secondary | ICD-10-CM

## 2020-06-13 ENCOUNTER — Other Ambulatory Visit: Payer: Self-pay

## 2020-06-13 ENCOUNTER — Other Ambulatory Visit
Admission: RE | Admit: 2020-06-13 | Discharge: 2020-06-13 | Disposition: A | Discharge status: Home / Self Care | Payer: Commercial Managed Care - PPO | Source: Ambulatory Visit | Attending: Internal Medicine | Admitting: Internal Medicine

## 2020-06-13 DIAGNOSIS — Z01812 Encounter for preprocedural laboratory examination: Secondary | ICD-10-CM | POA: Diagnosis not present

## 2020-06-13 DIAGNOSIS — Z20822 Contact with and (suspected) exposure to covid-19: Secondary | ICD-10-CM | POA: Insufficient documentation

## 2020-06-13 LAB — SARS CORONAVIRUS 2 (TAT 6-24 HRS): SARS Coronavirus 2: NEGATIVE

## 2020-06-14 ENCOUNTER — Encounter: Payer: Self-pay | Admitting: Internal Medicine

## 2020-06-17 ENCOUNTER — Ambulatory Visit: Payer: Commercial Managed Care - PPO | Admitting: Anesthesiology

## 2020-06-17 ENCOUNTER — Encounter
Admission: RE | Disposition: A | Discharge status: Home / Self Care | Payer: Self-pay | Source: Home / Self Care | Attending: Internal Medicine

## 2020-06-17 ENCOUNTER — Encounter: Payer: Self-pay | Admitting: Internal Medicine

## 2020-06-17 ENCOUNTER — Other Ambulatory Visit: Payer: Self-pay

## 2020-06-17 ENCOUNTER — Ambulatory Visit
Admission: RE | Admit: 2020-06-17 | Discharge: 2020-06-17 | Disposition: A | Discharge status: Home / Self Care | Payer: Commercial Managed Care - PPO | Attending: Internal Medicine | Admitting: Internal Medicine

## 2020-06-17 DIAGNOSIS — Z7982 Long term (current) use of aspirin: Secondary | ICD-10-CM | POA: Diagnosis not present

## 2020-06-17 DIAGNOSIS — Z1211 Encounter for screening for malignant neoplasm of colon: Secondary | ICD-10-CM | POA: Insufficient documentation

## 2020-06-17 DIAGNOSIS — I10 Essential (primary) hypertension: Secondary | ICD-10-CM | POA: Diagnosis not present

## 2020-06-17 DIAGNOSIS — Z7902 Long term (current) use of antithrombotics/antiplatelets: Secondary | ICD-10-CM | POA: Insufficient documentation

## 2020-06-17 DIAGNOSIS — I2510 Atherosclerotic heart disease of native coronary artery without angina pectoris: Secondary | ICD-10-CM | POA: Diagnosis not present

## 2020-06-17 DIAGNOSIS — Z955 Presence of coronary angioplasty implant and graft: Secondary | ICD-10-CM | POA: Insufficient documentation

## 2020-06-17 DIAGNOSIS — I252 Old myocardial infarction: Secondary | ICD-10-CM | POA: Diagnosis not present

## 2020-06-17 DIAGNOSIS — D123 Benign neoplasm of transverse colon: Secondary | ICD-10-CM | POA: Insufficient documentation

## 2020-06-17 DIAGNOSIS — Z880 Allergy status to penicillin: Secondary | ICD-10-CM | POA: Insufficient documentation

## 2020-06-17 DIAGNOSIS — F419 Anxiety disorder, unspecified: Secondary | ICD-10-CM | POA: Insufficient documentation

## 2020-06-17 DIAGNOSIS — Z800 Family history of malignant neoplasm of digestive organs: Secondary | ICD-10-CM | POA: Insufficient documentation

## 2020-06-17 DIAGNOSIS — K640 First degree hemorrhoids: Secondary | ICD-10-CM | POA: Insufficient documentation

## 2020-06-17 DIAGNOSIS — Z79899 Other long term (current) drug therapy: Secondary | ICD-10-CM | POA: Diagnosis not present

## 2020-06-17 HISTORY — DX: Anxiety disorder, unspecified: F41.9

## 2020-06-17 HISTORY — PX: COLONOSCOPY: SHX5424

## 2020-06-17 HISTORY — DX: Acute myocardial infarction, unspecified: I21.9

## 2020-06-17 SURGERY — COLONOSCOPY
Anesthesia: General

## 2020-06-17 MED ORDER — SODIUM CHLORIDE 0.9 % IV SOLN: INTRAVENOUS | Status: DC

## 2020-06-17 MED ORDER — PROPOFOL 10 MG/ML IV BOLUS
INTRAVENOUS | Status: DC | PRN
  Administered 2020-06-17: 70 mg via INTRAVENOUS
  Administered 2020-06-17: 50 mg via INTRAVENOUS

## 2020-06-17 MED ORDER — PROPOFOL 500 MG/50ML IV EMUL
INTRAVENOUS | Status: DC | PRN
  Administered 2020-06-17: 120 ug/kg/min via INTRAVENOUS

## 2020-06-17 NOTE — Interval H&P Note (Signed)
History and Physical Interval Note:  06/17/2020 2:04 PM  Renee Stephens  has presented today for surgery, with the diagnosis of FAMILY HX.OF COLON CANCER.  The various methods of treatment have been discussed with the patient and family. After consideration of risks, benefits and other options for treatment, the patient has consented to  Procedure(s): COLONOSCOPY (N/A) as a surgical intervention.  The patient's history has been reviewed, patient examined, no change in status, stable for surgery.  I have reviewed the patient's chart and labs.  Questions were answered to the patient's satisfaction.     [place], [place]

## 2020-06-17 NOTE — H&P (Signed)
Outpatient short stay form Pre-procedure 06/17/2020 1:59 PM Renee Stephens K. Alice Reichert, M.D.  Primary Physician: Fulton Reek, M.D.  Reason for visit: Family history colon cancer  History of present illness:   64 year old patient presenting for family history of colon cancer. Patient denies any change in bowel habits, rectal bleeding or involuntary weight loss.     Current Facility-Administered Medications:  .  0.9 %  sodium chloride infusion, , Intravenous, Continuous, [place] Dezza, Benay Pike, MD, Last Rate: 20 mL/hr at 06/17/20 1321, New Bag at 06/17/20 1321  Medications Prior to Admission  Medication Sig Dispense Refill Last Dose  . aspirin EC 81 MG tablet Take 81 mg by mouth daily.   06/16/2020 at Unknown time  . diltiazem (CARDIZEM CD) 180 MG 24 hr capsule Take 180 mg by mouth daily.   06/16/2020 at Unknown time  . FLUoxetine (PROZAC) 20 MG capsule Take 20 mg by mouth daily.    06/16/2020 at Unknown time  . furosemide (LASIX) 20 MG tablet Take 20 mg by mouth daily as needed for edema.   Past Week at Unknown time  . isosorbide mononitrate (IMDUR) 30 MG 24 hr tablet Take 30 mg by mouth daily.   Past Month at Unknown time  . metoprolol succinate (TOPROL-XL) 25 MG 24 hr tablet Take 25 mg by mouth daily.   Past Month at Unknown time  . omeprazole (PRILOSEC) 20 MG capsule Take 20 mg by mouth daily.   Past Month at Unknown time  . primidone (MYSOLINE) 50 MG tablet Take 50 mg by mouth daily.   06/16/2020 at Unknown time  . rOPINIRole (REQUIP) 1 MG tablet Take 2 mg by mouth See admin instructions. Take 2 mg in the evening and 2 mg at bedtime   06/16/2020 at Unknown time  . rosuvastatin (CRESTOR) 40 MG tablet Take 1 tablet (40 mg total) by mouth daily at 6 PM. 30 tablet 12 06/16/2020 at Unknown time  . acetaminophen (TYLENOL) 500 MG tablet Take 500 mg by mouth every 8 (eight) hours as needed for moderate pain or headache.     . clopidogrel (PLAVIX) 75 MG tablet Take 75 mg by mouth daily.   06/12/2020  .  docusate sodium (COLACE) 100 MG capsule Take 100 mg by mouth daily as needed for mild constipation.     . ticagrelor (BRILINTA) 90 MG TABS tablet Take 1 tablet (90 mg total) by mouth 2 (two) times daily. (Patient not taking: Reported on 10/25/2019) 60 tablet 12      Allergies  Allergen Reactions  . Penicillin G Swelling and Rash    Did it involve swelling of the face/tongue/throat, SOB, or low BP? Yes Did it involve sudden or severe rash/hives, skin peeling, or any reaction on the inside of your mouth or nose? No Did you need to seek medical attention at a hospital or doctor's office? Yes When did it last happen?childhood allergy If all above answers are "NO", may proceed with cephalosporin use.      Past Medical History:  Diagnosis Date  . Anxiety   . Coronary artery disease   . Myocardial infarction [facility])     Review of systems:  Otherwise negative.    Physical Exam  Gen: Alert, oriented. Appears stated age.  HEENT: [place]/AT. PERRLA. Lungs: CTA, no wheezes. CV: RR nl S1, S2. Abd: soft, benign, no masses. BS+ Ext: No edema. Pulses 2+    Planned procedures: Proceed with colonoscopy. The patient understands the nature of the planned procedure, indications, risks,  alternatives and potential complications including but not limited to bleeding, infection, perforation, damage to internal organs and possible oversedation/side effects from anesthesia. The patient agrees and gives consent to proceed.  Please refer to procedure notes for findings, recommendations and patient disposition/instructions.     Isom Kochan K. Alice Reichert, M.D. Gastroenterology 06/17/2020  1:59 PM

## 2020-06-17 NOTE — Transfer of Care (Signed)
Immediate Anesthesia Transfer of Care Note  Patient: Renee Stephens  Procedure(s) Performed: COLONOSCOPY (N/A )  Patient Location: PACU and Endoscopy Unit  Anesthesia Type:General  Level of Consciousness: drowsy and patient cooperative  Airway & Oxygen Therapy: Patient Spontanous Breathing  Post-op Assessment: Report given to RN and Post -op Vital signs reviewed and stable  Post vital signs: Reviewed and stable  Last Vitals:  Vitals Value Taken Time  BP 108/60 06/17/20 1437  Temp    Pulse 74 06/17/20 1437  Resp 17 06/17/20 1437  SpO2 100 % 06/17/20 1437  Vitals shown include unvalidated device data.  Last Pain:  Vitals:   06/17/20 1306  TempSrc: Temporal  PainSc: 0-No pain         Complications: No complications documented.

## 2020-06-17 NOTE — Op Note (Signed)
[facility] Gastroenterology Patient Name: Tony Friscia Procedure Date: 06/17/2020 1:57 PM MRN: [id number] Account #: [id number] Date of Birth: 1956/01/20 Admit Type: Outpatient Age: 64 Room: [facility] ENDO ROOM 3 Gender: Female Note Status: Finalized Procedure:             Colonoscopy Indications:           Screening in patient at increased risk: Family history                         of 1st-degree relative with colorectal cancer Providers:             Benay Pike. Alice Reichert MD, MD Referring MD:          Leonie Douglas. Doy Hutching, MD (Referring MD) Medicines:             Propofol per Anesthesia Complications:         No immediate complications. Procedure:             Pre-Anesthesia Assessment:                        - The risks and benefits of the procedure and the                         sedation options and risks were discussed with the                         patient. All questions were answered and informed                         consent was obtained.                        - Patient identification and proposed procedure were                         verified prior to the procedure by the nurse. The                         procedure was verified in the procedure room.                        - ASA Grade Assessment: III - A patient with severe                         systemic disease.                        - After reviewing the risks and benefits, the patient                         was deemed in satisfactory condition to undergo the                         procedure.                        After obtaining informed consent, the colonoscope was                         passed under  direct vision. Throughout the procedure,                         the patient's blood pressure, pulse, and oxygen                         saturations were monitored continuously. The                         Colonoscope was introduced through the anus and                         advanced to the the  cecum, identified by appendiceal                         orifice and ileocecal valve. The colonoscopy was                         performed without difficulty. The patient tolerated                         the procedure well. The quality of the bowel                         preparation was adequate. The ileocecal valve,                         appendiceal orifice, and rectum were photographed. Findings:      The perianal and digital rectal examinations were normal. Pertinent       negatives include normal sphincter tone and no palpable rectal lesions.      A 6 mm polyp was found in the transverse colon. The polyp was sessile.       The polyp was removed with a cold snare. Resection and retrieval were       complete.      Non-bleeding internal hemorrhoids were found during retroflexion. The       hemorrhoids were Grade I (internal hemorrhoids that do not prolapse). Impression:            - One 6 mm polyp in the transverse colon, removed with                         a cold snare. Resected and retrieved.                        - Non-bleeding internal hemorrhoids. Recommendation:        - Patient has a contact number available for                         emergencies. The signs and symptoms of potential                         delayed complications were discussed with the patient.                         Return to normal activities tomorrow. Written                         discharge instructions  were provided to the patient.                        - Resume previous diet.                        - Continue present medications.                        - Repeat colonoscopy is recommended for surveillance.                         The colonoscopy date will be determined after                         pathology results from today's exam become available                         for review.                        - Return to GI office PRN.                        - The findings and recommendations were  discussed with                         the patient. Procedure Code(s):     --- Professional ---                        [phone], Colonoscopy, flexible; with removal of                         tumor(s), polyp(s), or other lesion(s) by snare                         technique Diagnosis Code(s):     --- Professional ---                        K64.0, First degree hemorrhoids                        K63.5, Polyp of colon                        Z80.0, Family history of malignant neoplasm of                         digestive organs CPT copyright 2019 [employer]. All rights reserved. The codes documented in this report are preliminary and upon coder review may  be revised to meet current compliance requirements. Efrain Sella MD, MD 06/17/2020 2:37:27 PM This report has been signed electronically. Number of Addenda: 0 Note Initiated On: 06/17/2020 1:57 PM Scope Withdrawal Time: 0 hours 7 minutes 44 seconds  Total Procedure Duration: 0 hours 12 minutes 37 seconds  Estimated Blood Loss:  Estimated blood loss: none.      [facility]

## 2020-06-17 NOTE — Anesthesia Preprocedure Evaluation (Signed)
Anesthesia Evaluation  Patient identified by MRN, date of birth, ID band Patient awake    Reviewed: Allergy & Precautions, NPO status , Patient's Chart, lab work & pertinent test results  History of Anesthesia Complications Negative for: history of anesthetic complications  Airway Mallampati: II  TM Distance: >3 FB Neck ROM: Full    Dental no notable dental hx.    Pulmonary neg pulmonary ROS, neg sleep apnea, neg COPD,    breath sounds clear to auscultation- rhonchi (-) wheezing      Cardiovascular hypertension, Pt. on medications + CAD, + Past MI and + Cardiac Stents (11/2018)  (-) CABG  Rhythm:Regular Rate:Normal - Systolic murmurs and - Diastolic murmurs L heart cath 11/01/19:  Previously placed Dist LM to Prox LAD stent (unknown type) is widely patent.  Normal overall left ventricular function ejection fraction of 60%    Neuro/Psych neg Seizures Anxiety negative neurological ROS     GI/Hepatic negative GI ROS, Neg liver ROS,   Endo/Other  negative endocrine ROSneg diabetes  Renal/GU negative Renal ROS     Musculoskeletal negative musculoskeletal ROS (+)   Abdominal (+) - obese,   Peds  Hematology negative hematology ROS (+)   Anesthesia Other Findings Past Medical History: No date: Anxiety No date: Coronary artery disease No date: Myocardial infarction [facility])   Reproductive/Obstetrics                             Anesthesia Physical Anesthesia Plan  ASA: III  Anesthesia Plan: General   Post-op Pain Management:    Induction: Intravenous  PONV Risk Score and Plan: 2 and Propofol infusion  Airway Management Planned: Natural Airway  Additional Equipment:   Intra-op Plan:   Post-operative Plan:   Informed Consent: I have reviewed the patients History and Physical, chart, labs and discussed the procedure including the risks, benefits and alternatives for the proposed  anesthesia with the patient or authorized representative who has indicated his/her understanding and acceptance.     Dental advisory given  Plan Discussed with: CRNA and Anesthesiologist  Anesthesia Plan Comments:         Anesthesia Quick Evaluation

## 2020-06-18 ENCOUNTER — Encounter: Payer: Self-pay | Admitting: Internal Medicine

## 2020-06-18 NOTE — Anesthesia Postprocedure Evaluation (Signed)
Anesthesia Post Note  Patient: Renee Stephens  Procedure(s) Performed: COLONOSCOPY (N/A )  Patient location during evaluation: PACU Anesthesia Type: General Level of consciousness: awake and alert Pain management: pain level controlled Vital Signs Assessment: post-procedure vital signs reviewed and stable Respiratory status: spontaneous breathing, nonlabored ventilation and respiratory function stable Cardiovascular status: blood pressure returned to baseline and stable Postop Assessment: no apparent nausea or vomiting Anesthetic complications: no   No complications documented.   Last Vitals:  Vitals:   06/17/20 1447 06/17/20 1457  BP: 124/68 129/75  Pulse: 69 63  Resp: 15 11  Temp:    SpO2: 100% 100%    Last Pain:  Vitals:   06/17/20 1457  TempSrc:   PainSc: 0-No pain                 Renee Stephens

## 2020-06-19 LAB — SURGICAL PATHOLOGY

## 2020-07-08 ENCOUNTER — Other Ambulatory Visit
Admission: RE | Admit: 2020-07-08 | Discharge: 2020-07-08 | Disposition: A | Discharge status: Home / Self Care | Payer: Commercial Managed Care - PPO | Source: Ambulatory Visit | Attending: Student | Admitting: Student

## 2020-07-08 ENCOUNTER — Other Ambulatory Visit: Payer: Self-pay | Admitting: Student

## 2020-07-08 DIAGNOSIS — R7989 Other specified abnormal findings of blood chemistry: Secondary | ICD-10-CM

## 2020-07-08 DIAGNOSIS — R0789 Other chest pain: Secondary | ICD-10-CM | POA: Diagnosis not present

## 2020-07-08 LAB — FIBRIN DERIVATIVES D-DIMER (ARMC ONLY): Fibrin derivatives D-dimer (ARMC): 951.85 ng/mL (FEU) — ABNORMAL HIGH (ref 0.00–499.00)

## 2020-07-09 ENCOUNTER — Other Ambulatory Visit: Payer: Self-pay

## 2020-07-09 ENCOUNTER — Ambulatory Visit
Admission: RE | Admit: 2020-07-09 | Discharge: 2020-07-09 | Disposition: A | Discharge status: Home / Self Care | Payer: Commercial Managed Care - PPO | Source: Ambulatory Visit | Attending: Student | Admitting: Student

## 2020-07-09 DIAGNOSIS — R7989 Other specified abnormal findings of blood chemistry: Secondary | ICD-10-CM | POA: Insufficient documentation

## 2020-07-09 LAB — POCT I-STAT CREATININE: Creatinine, Ser: 1.1 mg/dL — ABNORMAL HIGH (ref 0.44–1.00)

## 2020-07-09 IMAGING — CT CT ANGIO CHEST
2 of 6 series · 19 of 46 positions shown · IV contrast (omnipaque)
Comparison: [DATE]

CLINICAL DATA: Chest pain and shortness of breath.

EXAM:
CT ANGIOGRAPHY CHEST WITH CONTRAST
TECHNIQUE: Multidetector CT imaging of the chest was performed using the
standard protocol during bolus administration of intravenous
contrast. Multiplanar CT image reconstructions and MIPs were
obtained to evaluate the vascular anatomy.
CONTRAST:  75mL OMNIPAQUE IOHEXOL 350 MG/ML SOLN

[Series 5: thins · axial · 0.63mm/px · z∈[-311,-42]mm · 16 of 295 slices shown]
[im 13/295  lung]
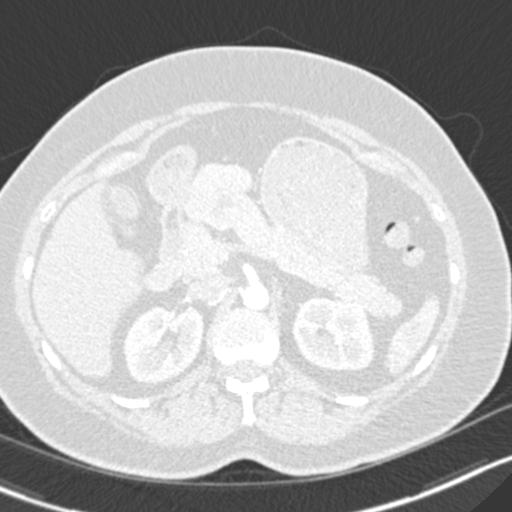
[im 39/295  soft-tissue]
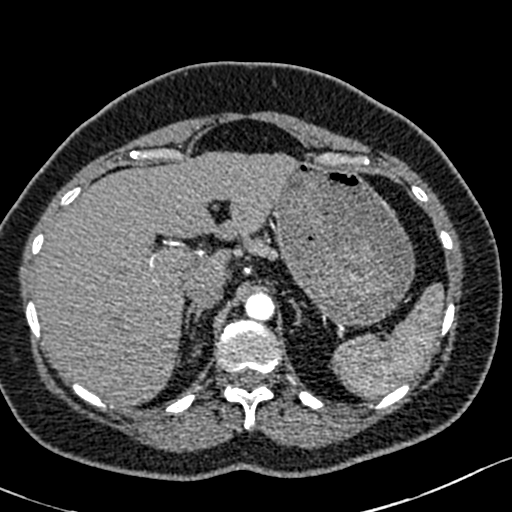
[im 52/295  lung]
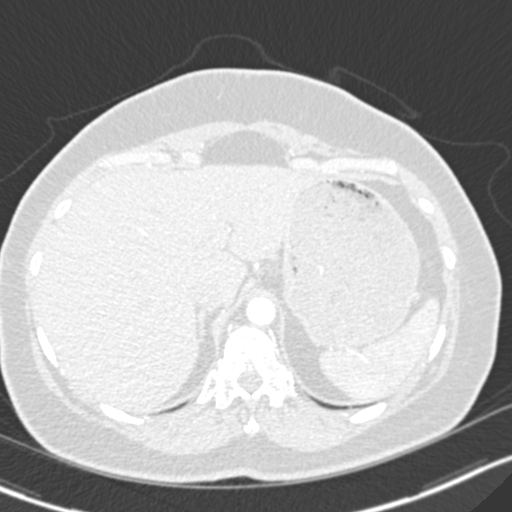
[im 64/295  soft-tissue]
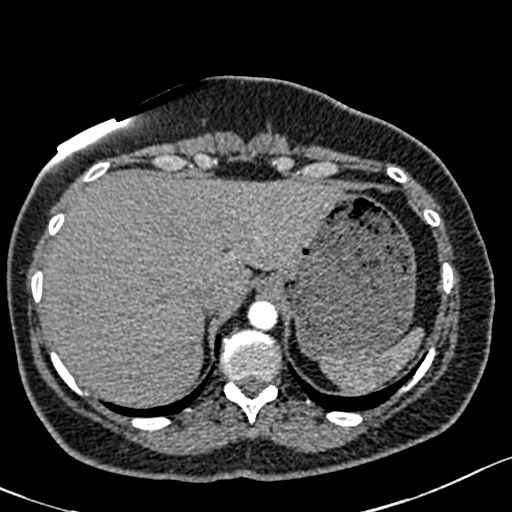
[im 90/295  lung]
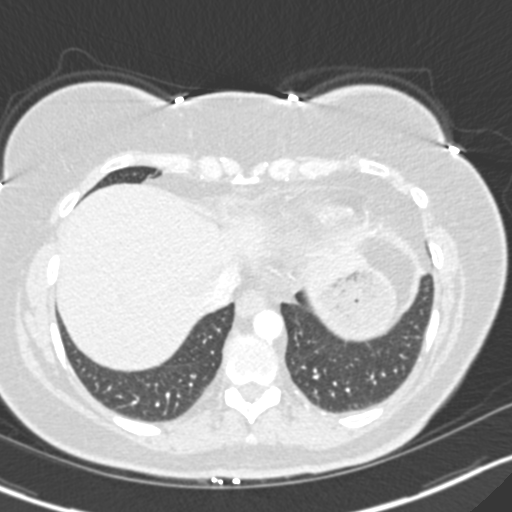
[im 103/295  soft-tissue]
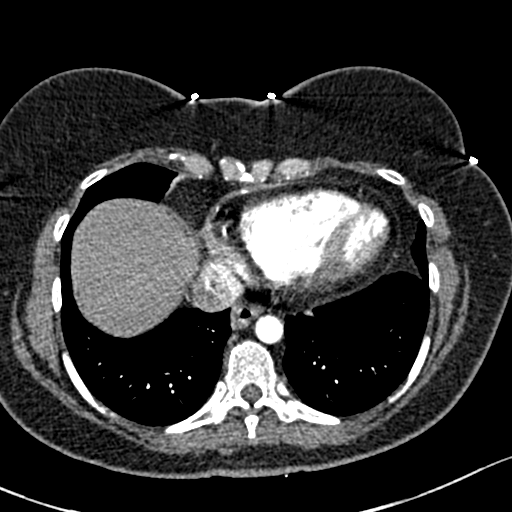
[im 116/295  lung]
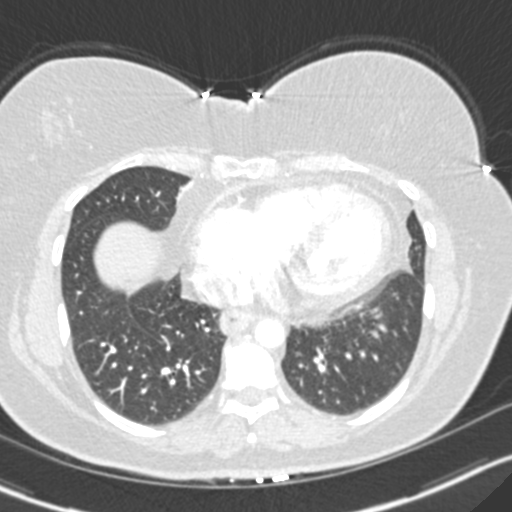
[im 141/295  soft-tissue]
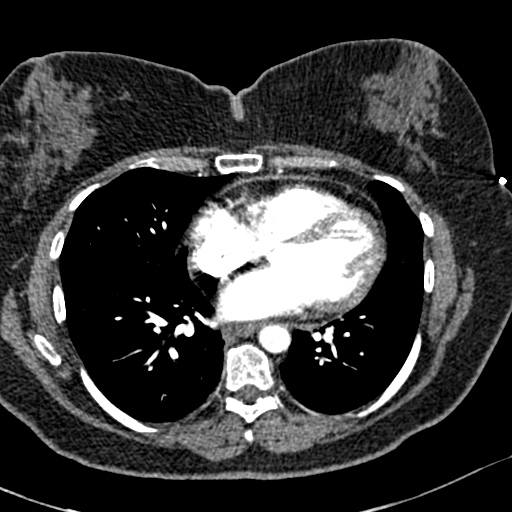
[im 154/295  lung]
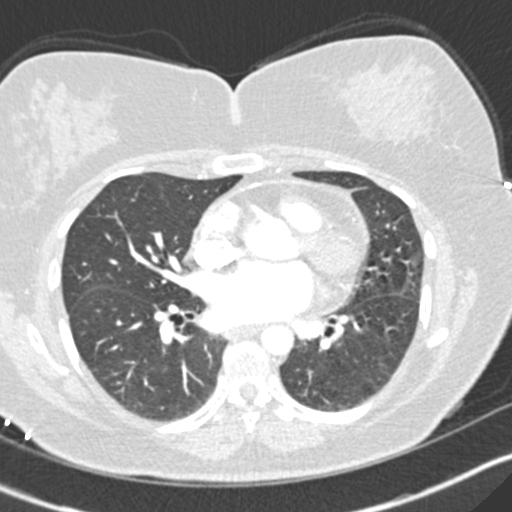
[im 179/295  soft-tissue]
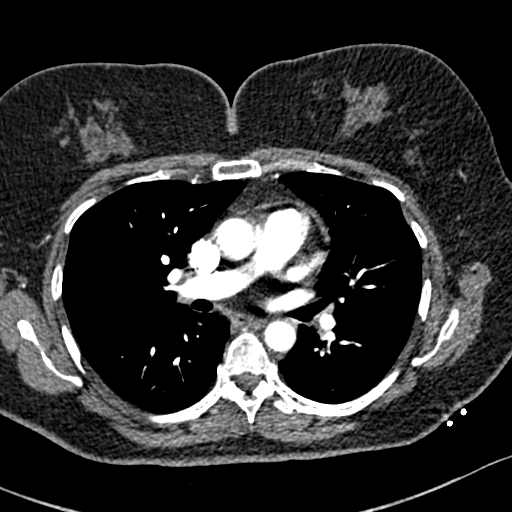
[im 192/295  lung]
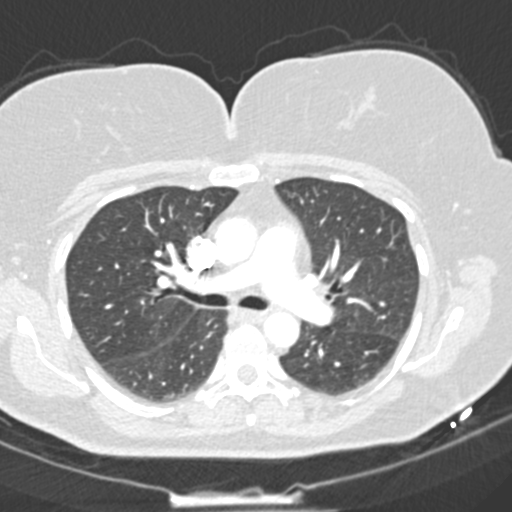
[im 205/295  soft-tissue]
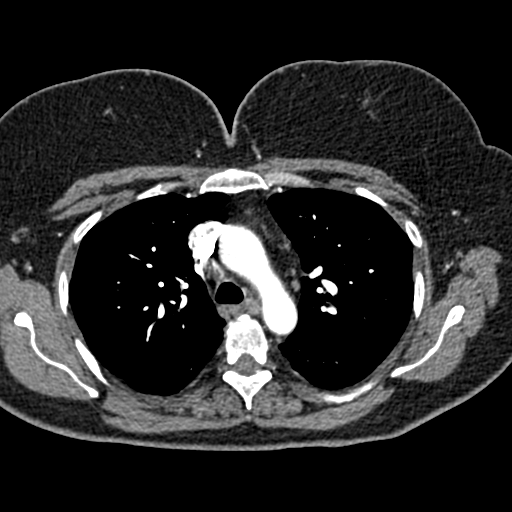
[im 231/295  lung]
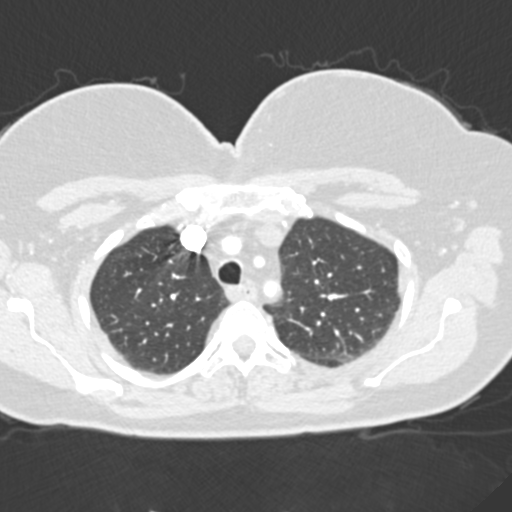
[im 243/295  soft-tissue]
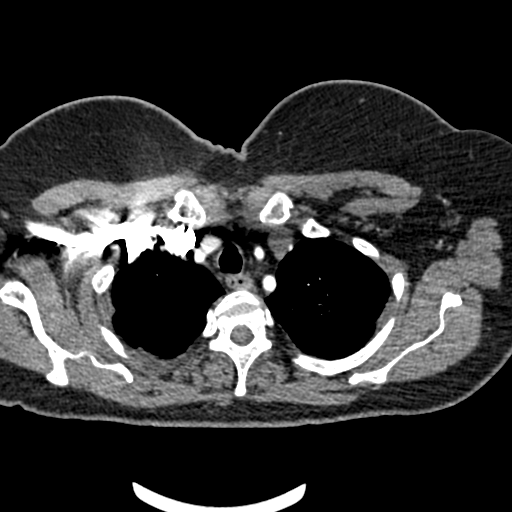
[im 256/295  lung]
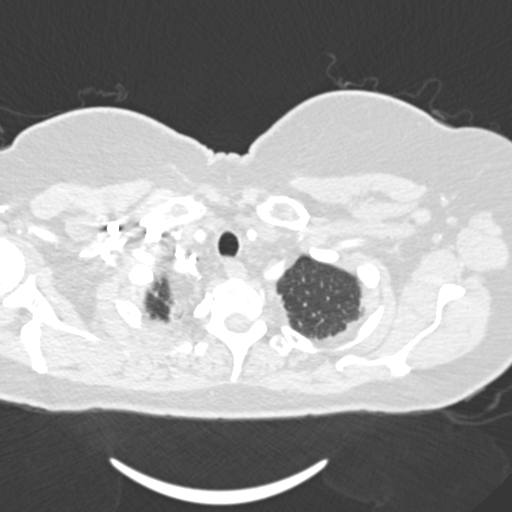
[im 282/295  soft-tissue]
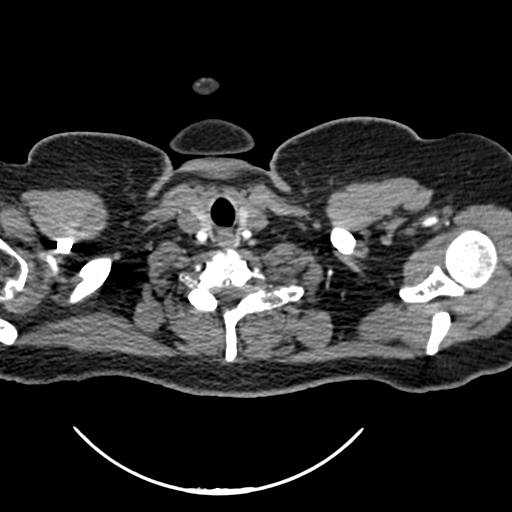

[Series 7: coronal mpr · coronal · 0.57mm/px · 3 of 82 slices shown]
[im 21/82  soft-tissue]
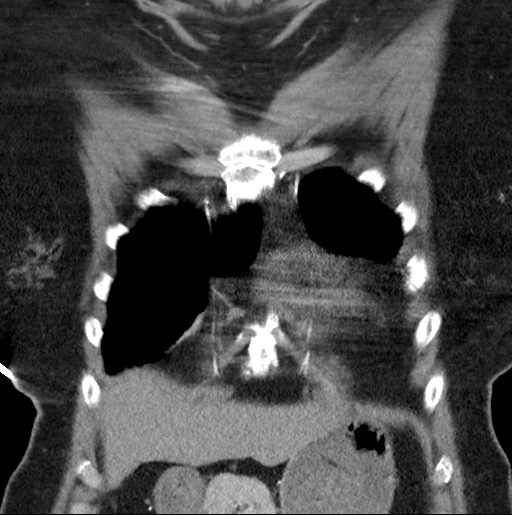
[im 41/82  soft-tissue]
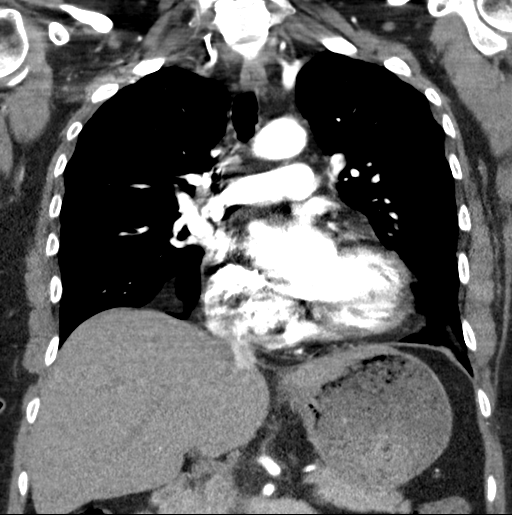
[im 61/82  soft-tissue]
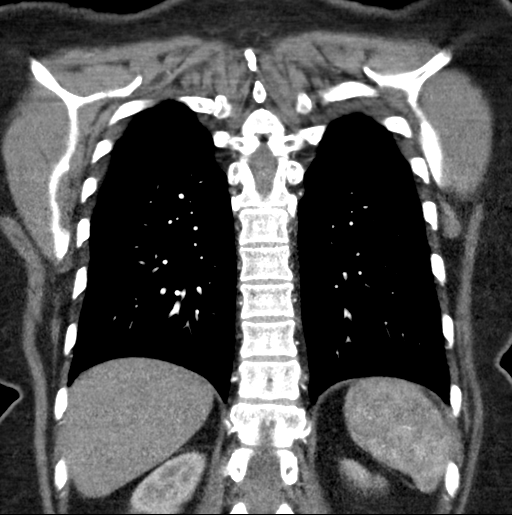

[19 of 46 positions shown; findings below may reference images not displayed]

FINDINGS: Cardiovascular: The heart is normal in size. No pericardial
effusion. There are scattered pericardial calcifications suggesting
prior inflammatory process. The aorta is normal in caliber. No
dissection. No atherosclerotic calcifications. The branch vessels
are patent. No definite coronary artery calcifications.

The pulmonary arterial tree is well opacified. No filling defects to
suggest pulmonary embolism.

Mediastinum/Nodes: No mediastinal or hilar mass or lymphadenopathy.
Small scattered lymph nodes are stable. The esophagus is grossly
normal.

Lungs/Pleura: No acute pulmonary findings. No worrisome pulmonary
lesions. No pleural effusions.

Upper Abdomen: No significant upper abdominal findings.

Musculoskeletal: No breast masses, supraclavicular or axillary
adenopathy. Thyroid gland is unremarkable. The bony structures are
intact.

Review of the MIP images confirms the above findings.
IMPRESSION: 1. No CT findings for pulmonary embolism.
2. Normal thoracic aorta.
3. Scattered pericardial calcifications suggesting prior
inflammatory process.
4. No acute pulmonary findings.

## 2020-07-09 MED ORDER — IOHEXOL 350 MG/ML SOLN
75.0000 mL | Freq: Once | INTRAVENOUS | Status: AC | PRN
  Administered 2020-07-09: 75 mL via INTRAVENOUS

## 2020-08-14 ENCOUNTER — Ambulatory Visit
Admission: RE | Admit: 2020-08-14 | Discharge: 2020-08-14 | Disposition: A | Discharge status: Home / Self Care | Payer: Commercial Managed Care - PPO | Source: Ambulatory Visit | Attending: Internal Medicine | Admitting: Internal Medicine

## 2020-08-14 ENCOUNTER — Other Ambulatory Visit: Payer: Self-pay

## 2020-08-14 DIAGNOSIS — Z1231 Encounter for screening mammogram for malignant neoplasm of breast: Secondary | ICD-10-CM

## 2020-08-14 IMAGING — MG DIGITAL SCREENING BILAT W/ TOMO W/ CAD
8 series · 8 of 24 positions shown · non-contrast
Comparison: Previous exam(s).

CLINICAL DATA: Screening.

EXAM:
DIGITAL SCREENING BILATERAL MAMMOGRAM WITH TOMO AND CAD

[R CC synth-2D]
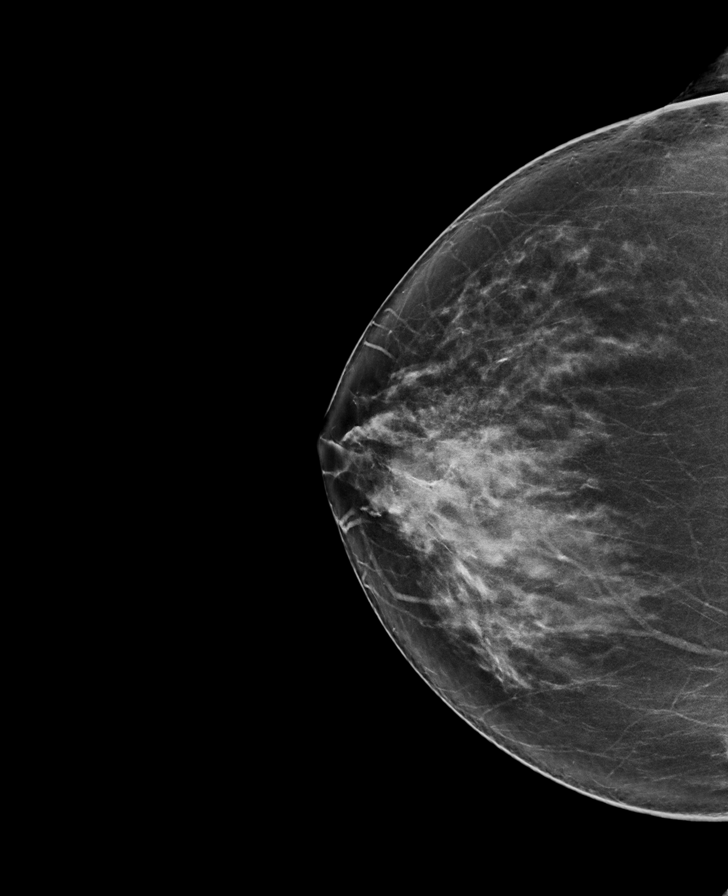

[L MLO synth-2D]
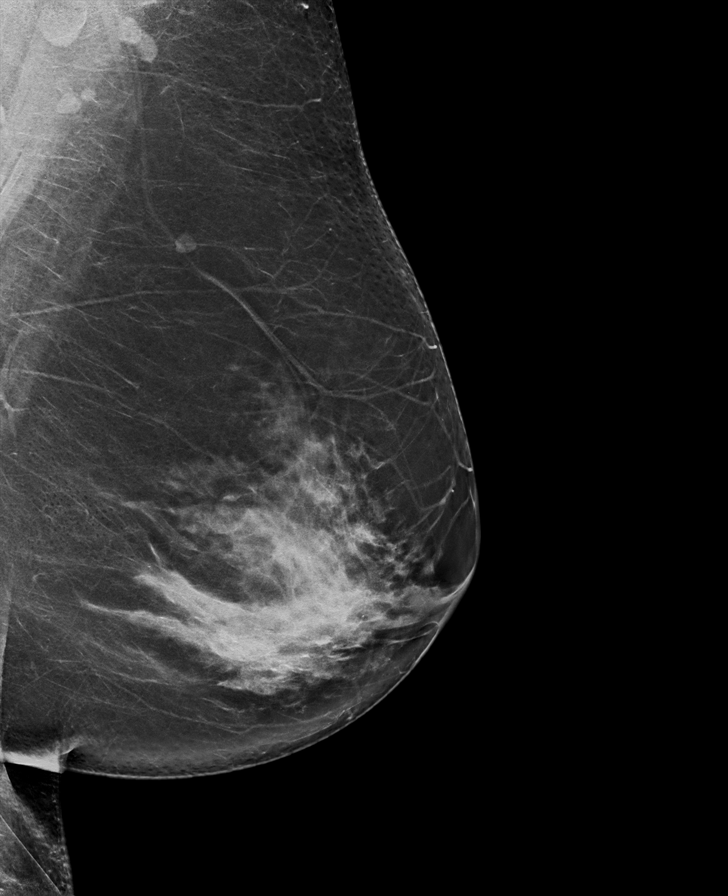

[L CC synth-2D]
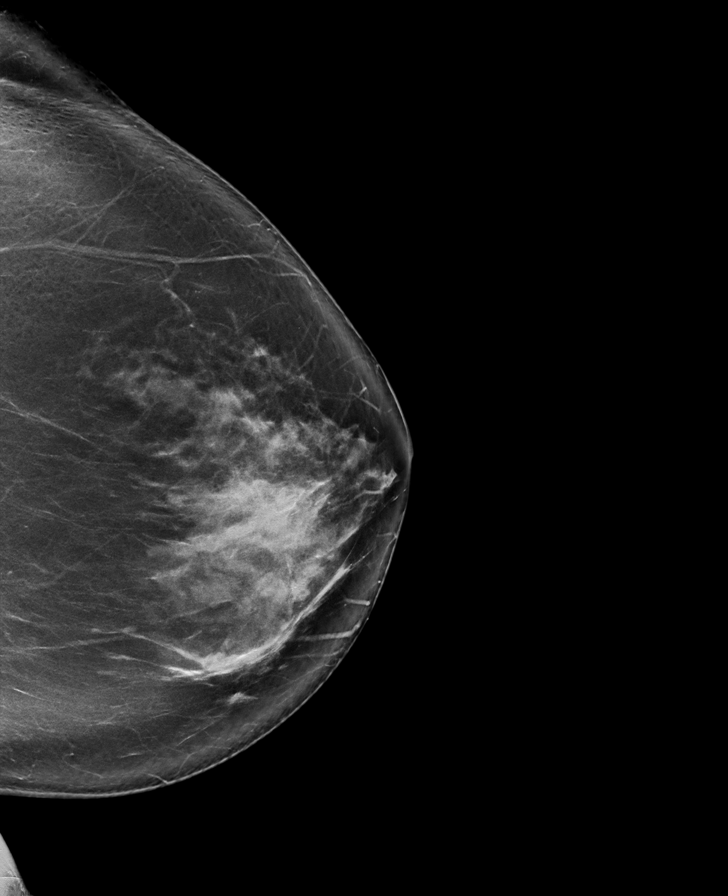

[R MLO synth-2D]
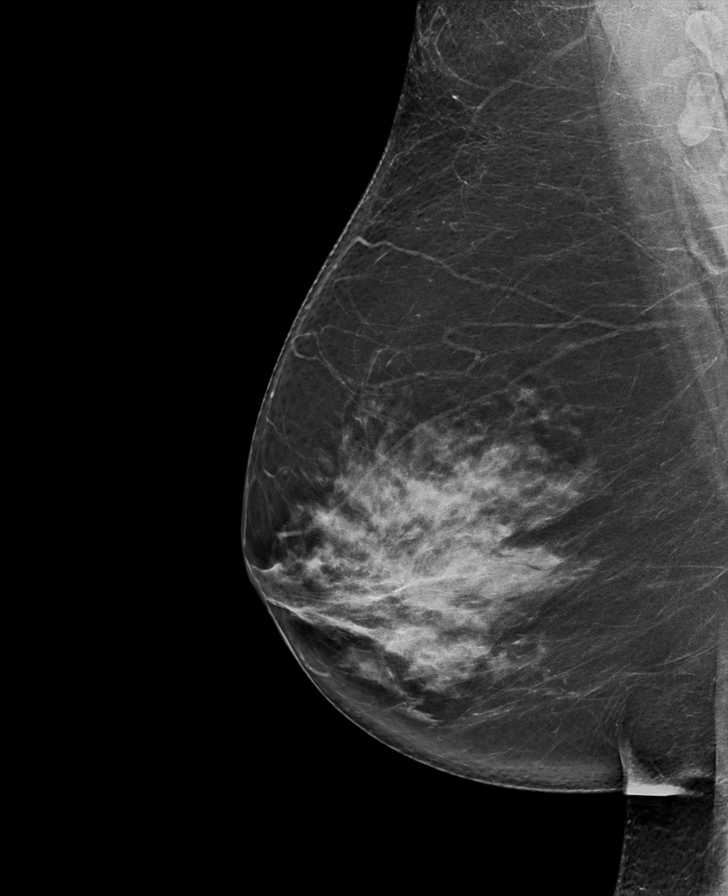

[R MLO tomo · tomo slice 41/81.0]
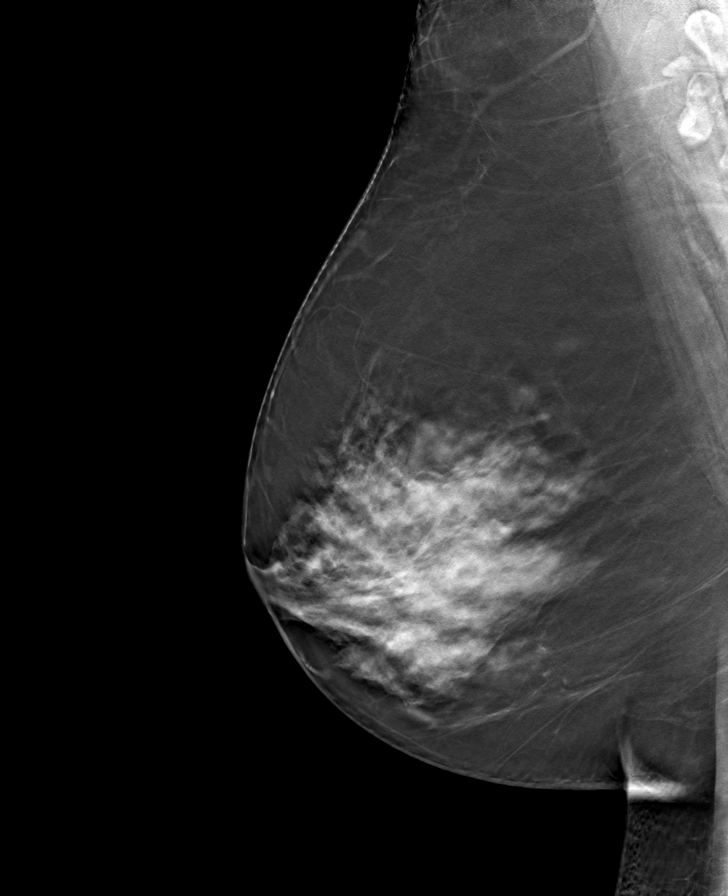

[L CC tomo · tomo slice 44/87.0]
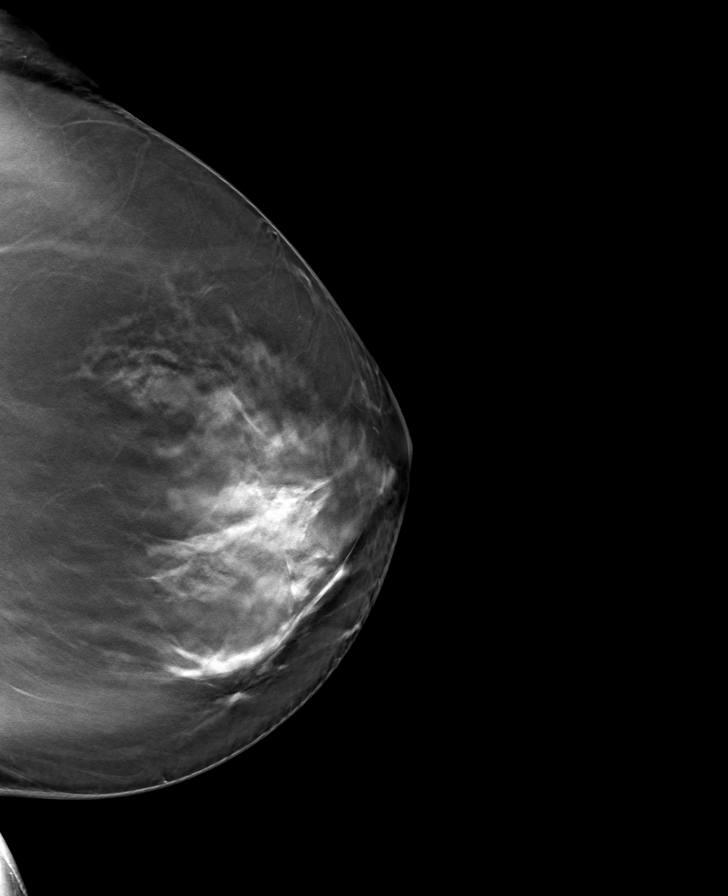

[L MLO tomo · tomo slice 43/85.0]
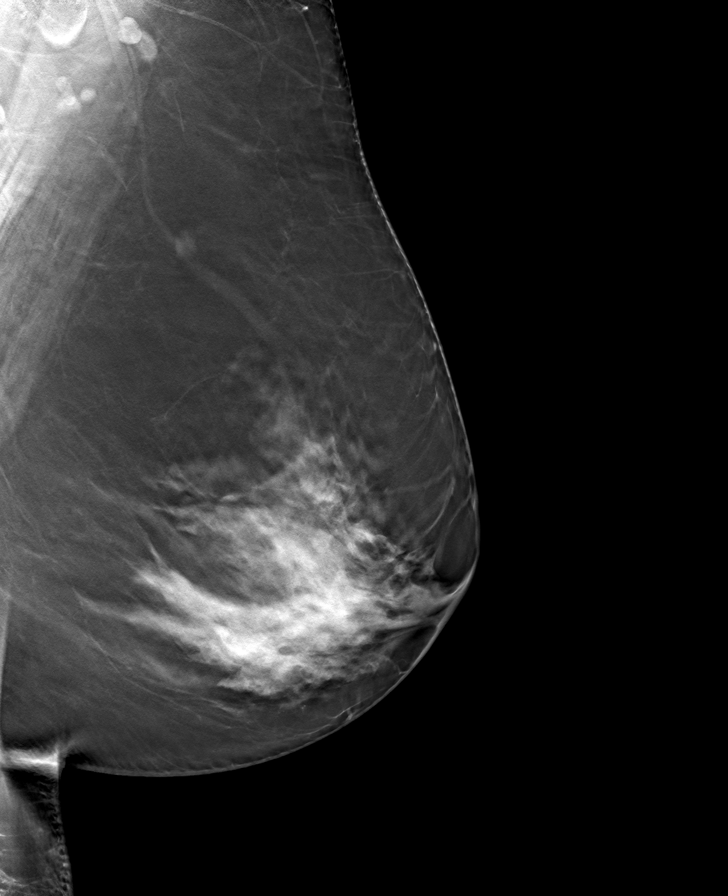

[R CC tomo · tomo slice 40/79.0]
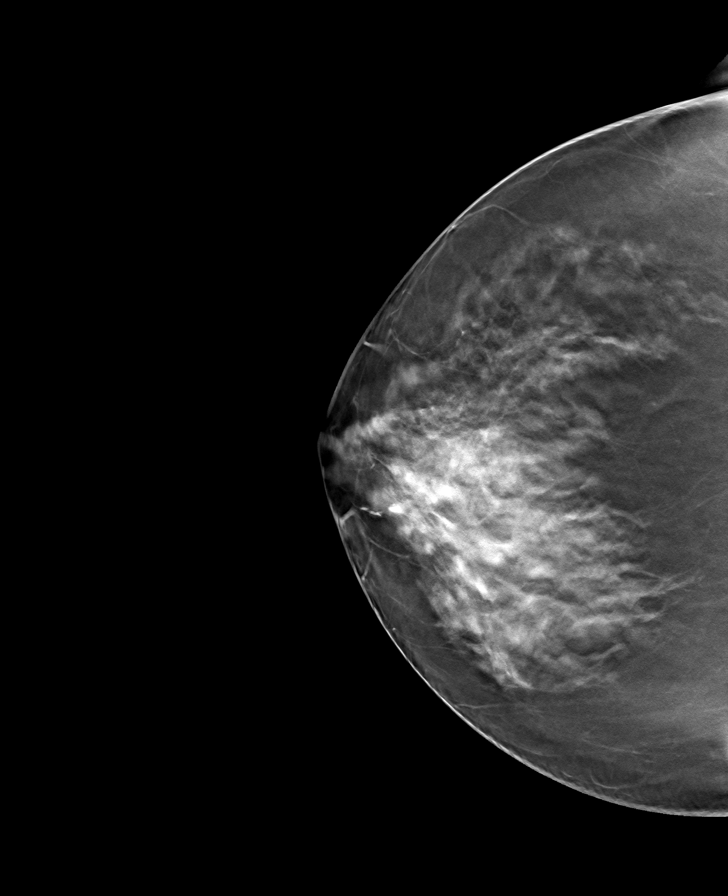

[8 of 24 positions shown; findings below may reference images not displayed]

ACR Breast Density Category c: The breast tissue is heterogeneously
dense, which may obscure small masses.
FINDINGS: There are no findings suspicious for malignancy. Images were
processed with CAD.
IMPRESSION: No mammographic evidence of malignancy. A result letter of this
screening mammogram will be mailed directly to the patient.

RECOMMENDATION:
Screening mammogram in one year. (Code:[5V])

BI-RADS CATEGORY  1: Negative.

## 2020-11-01 ENCOUNTER — Other Ambulatory Visit: Payer: Self-pay | Admitting: Internal Medicine

## 2020-11-01 DIAGNOSIS — R1310 Dysphagia, unspecified: Secondary | ICD-10-CM

## 2020-11-26 ENCOUNTER — Ambulatory Visit
Admission: RE | Admit: 2020-11-26 | Discharge: 2020-11-26 | Disposition: A | Discharge status: Home / Self Care | Payer: Commercial Managed Care - PPO | Source: Ambulatory Visit | Attending: Internal Medicine | Admitting: Internal Medicine

## 2020-11-26 ENCOUNTER — Other Ambulatory Visit: Payer: Self-pay

## 2020-11-26 DIAGNOSIS — R1310 Dysphagia, unspecified: Secondary | ICD-10-CM | POA: Diagnosis not present

## 2020-11-26 IMAGING — RF DG ESOPHAGUS
12 of 18 series · 14 of 24 positions shown · non-contrast
Comparison: CT chest [DATE].

CLINICAL DATA: Dysphagia.

EXAM:
ESOPHOGRAM / BARIUM SWALLOW / BARIUM TABLET STUDY
TECHNIQUE: Combined double contrast and single contrast examination performed
using effervescent crystals, thick barium liquid, and thin barium
liquid. The patient was observed with fluoroscopy swallowing a 13 mm
barium sulphate tablet.
FLUOROSCOPY TIME:  Fluoroscopy Time:  Minute 6 seconds
Radiation Exposure Index (if provided by the fluoroscopic device):
49.0 mGy

[Series 1: fluoro_barium 2fps_bw · 0.18mm/px · 2 of 9 frames shown (1 of 12)]
[frame 2/9]
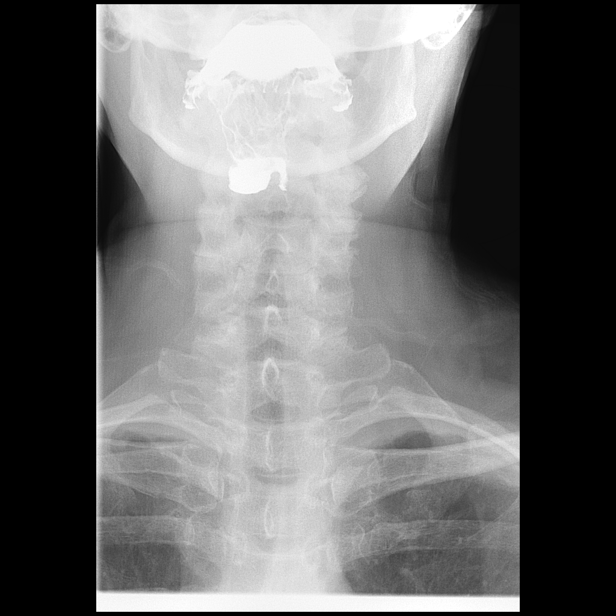
[frame 5/9]
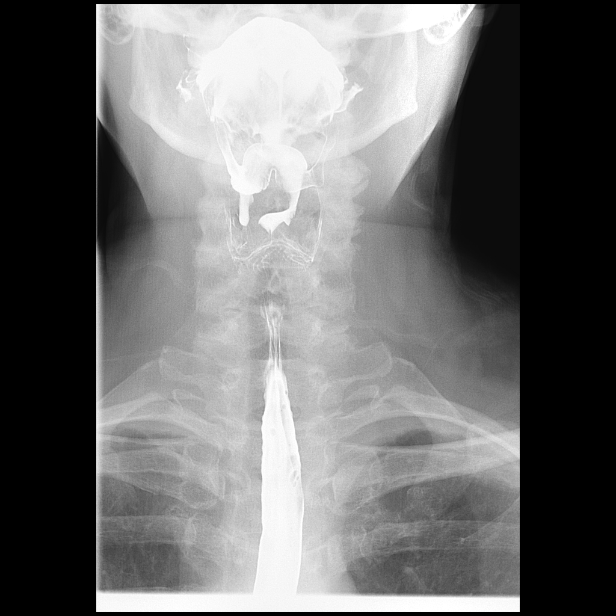

[Series 2: fluoro_barium 2fps_bw · 0.18mm/px · 2 of 11 frames shown (2 of 12)]
[frame 2/11]
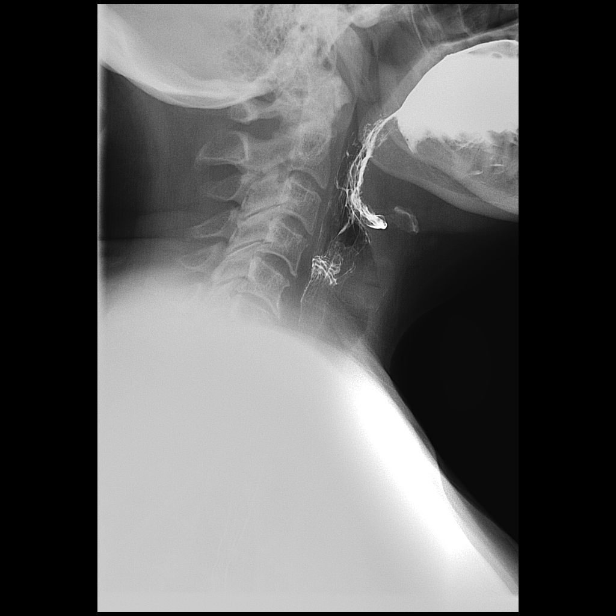
[frame 10/11]
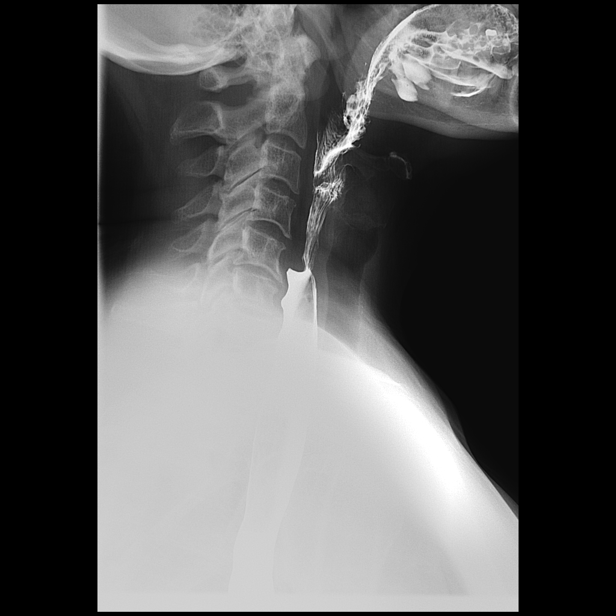

[Series 3: fluoro_barium 2fps_bw · 0.18mm/px · 1 of 1 slices shown (3 of 12)]
[im 1/1]
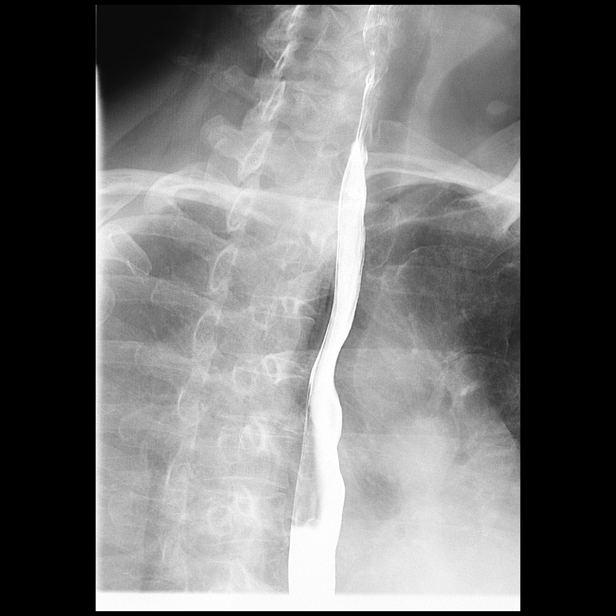

[Series 5: fluoro_barium 2fps_bw · 0.18mm/px · 1 of 1 slices shown (4 of 12)]
[im 1/1]
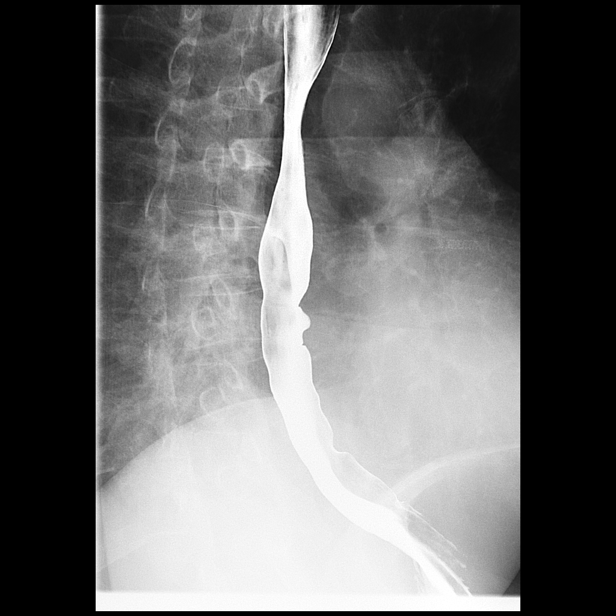

[Series 7: fluoro_barium 2fps_bw · 0.18mm/px · 1 of 1 slices shown (5 of 12)]
[im 1/1]
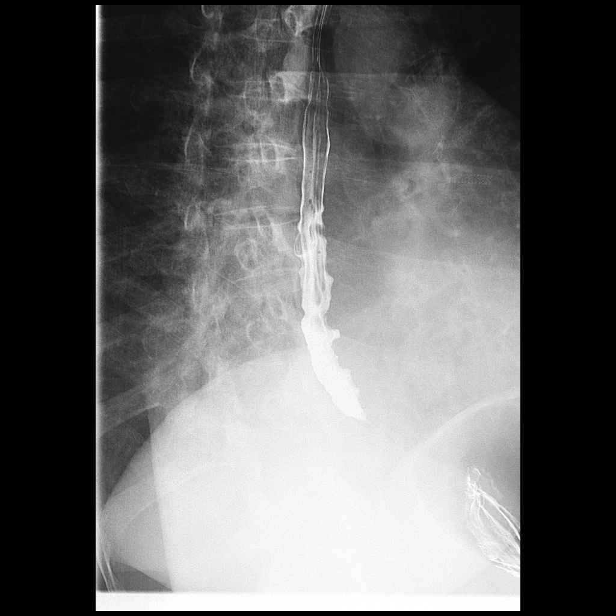

[Series 8: fluoro_barium 2fps_bw · 0.18mm/px · 1 of 1 slices shown (6 of 12)]
[im 1/1]
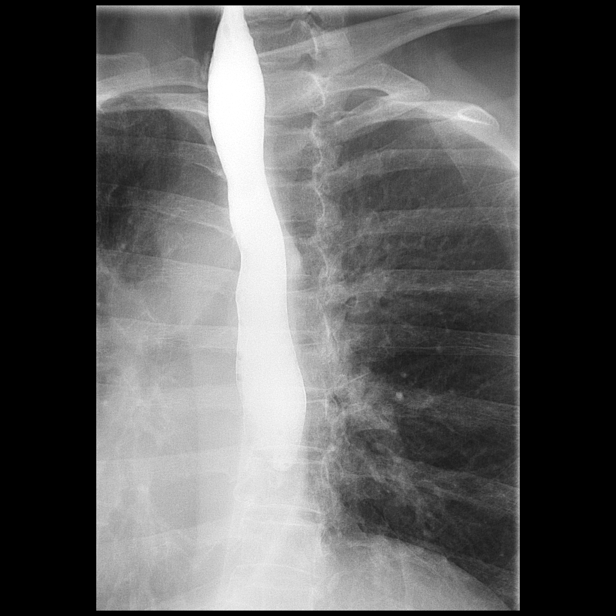

[Series 10: fluoro_barium 2fps_bw · 0.18mm/px · 1 of 1 slices shown (7 of 12)]
[im 1/1]
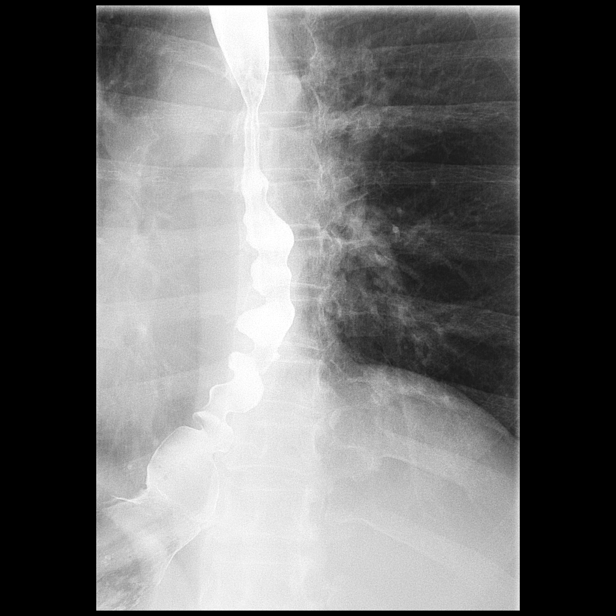

[Series 12: fluoro_barium 2fps_bw · 0.18mm/px · 1 of 1 slices shown (8 of 12)]
[im 1/1]
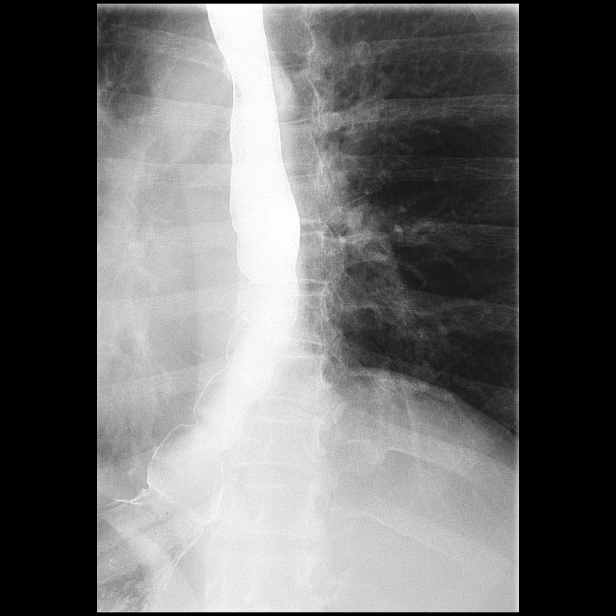

[Series 15: fluoro_barium 2fps_bw · 0.18mm/px · 1 of 1 slices shown (9 of 12)]
[im 1/1]
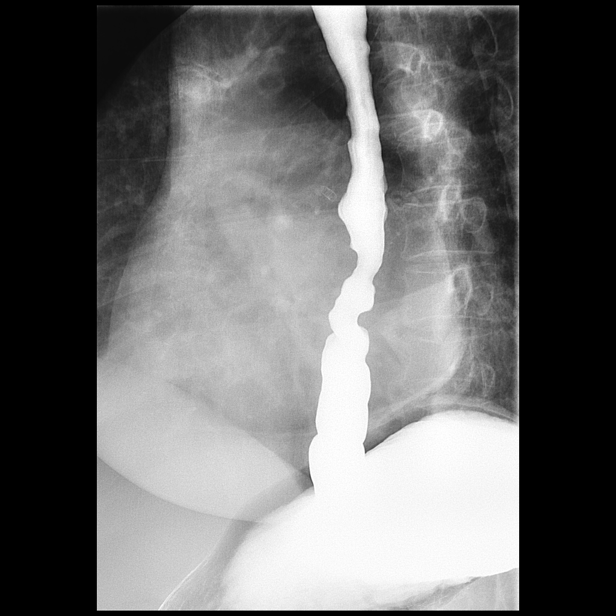

[Series 16: fluoro_barium 2fps_bw · 0.20mm/px · 1 of 1 slices shown (10 of 12)]
[im 1/1]
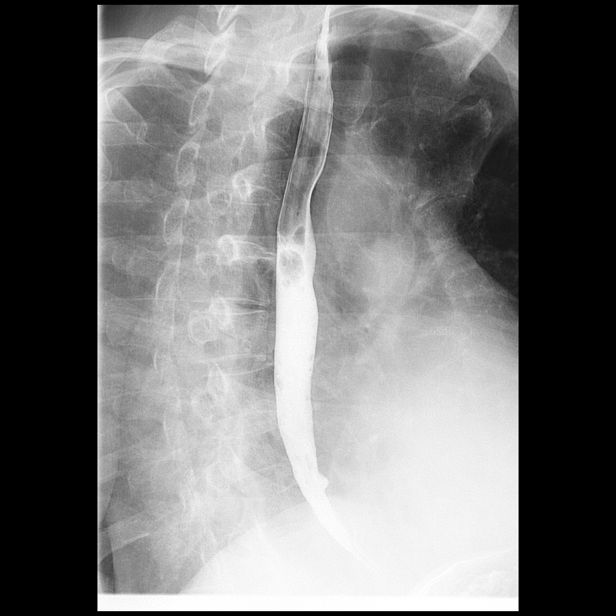

[Series 18: fluoro_barium 2fps_bw · 0.20mm/px · 1 of 2 frames shown (11 of 12)]
[frame 1/2]
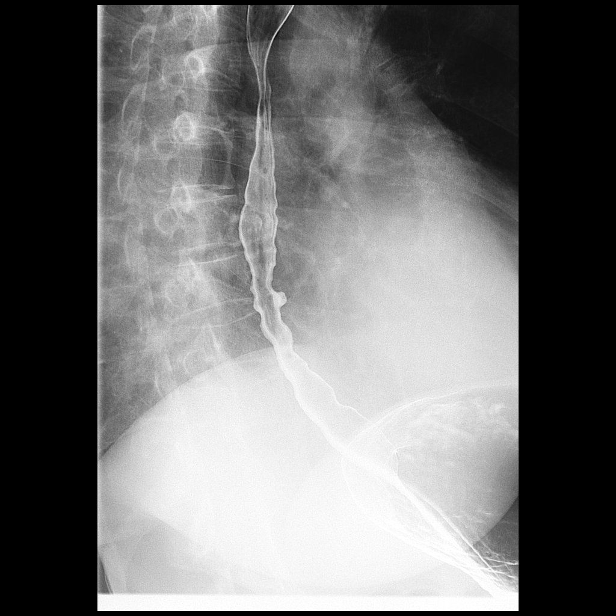

[Series 19: fluoro_barium 2fps_bw · 0.18mm/px · 1 of 1 slices shown (12 of 12)]
[im 1/1]
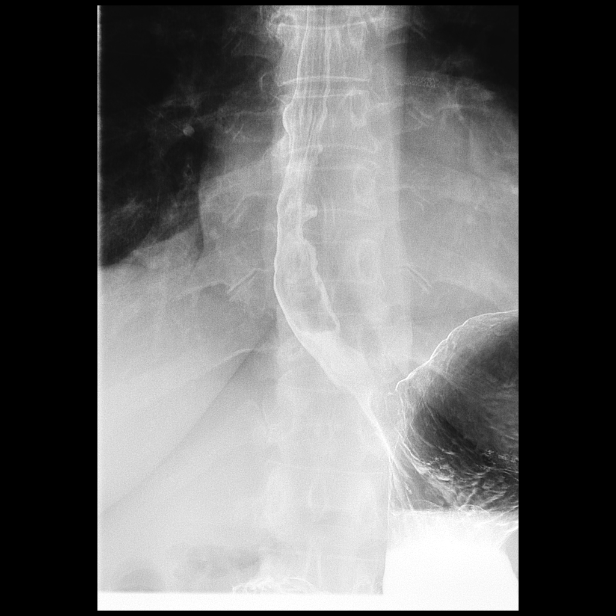

[14 of 24 positions shown; findings below may reference images not displayed]

FINDINGS: Cervical esophagus is widely patent. No evidence of aspiration. A
diverticulum versus ulceration cannot be exclude over the mid to
lower thoracic esophagus. Endoscopic evaluation should be
considered. Tertiary esophageal contractions. No reflux. Standard
barium tablet passes normally.
IMPRESSION: 1. A diverticulum versus ulceration cannot be excluded over the mid
to lower thoracic esophagus. Endoscopic evaluation should be
considered.

2.  Tertiary esophageal contractions.

3. No evidence of aspiration or reflux. Standard barium tablet
passes normally.

## 2021-07-29 ENCOUNTER — Emergency Department: Payer: Commercial Managed Care - PPO

## 2021-07-29 ENCOUNTER — Observation Stay
Admission: EM | Admit: 2021-07-29 | Discharge: 2021-07-30 | Disposition: A | Discharge status: Home / Self Care | Payer: Commercial Managed Care - PPO | Attending: Internal Medicine | Admitting: Internal Medicine

## 2021-07-29 ENCOUNTER — Other Ambulatory Visit: Payer: Self-pay

## 2021-07-29 DIAGNOSIS — I25110 Atherosclerotic heart disease of native coronary artery with unstable angina pectoris: Secondary | ICD-10-CM | POA: Diagnosis not present

## 2021-07-29 DIAGNOSIS — R079 Chest pain, unspecified: Secondary | ICD-10-CM | POA: Diagnosis present

## 2021-07-29 DIAGNOSIS — Z79899 Other long term (current) drug therapy: Secondary | ICD-10-CM | POA: Insufficient documentation

## 2021-07-29 DIAGNOSIS — I2510 Atherosclerotic heart disease of native coronary artery without angina pectoris: Secondary | ICD-10-CM

## 2021-07-29 DIAGNOSIS — Z955 Presence of coronary angioplasty implant and graft: Secondary | ICD-10-CM | POA: Insufficient documentation

## 2021-07-29 DIAGNOSIS — G20 Parkinson's disease: Secondary | ICD-10-CM | POA: Insufficient documentation

## 2021-07-29 DIAGNOSIS — Z7982 Long term (current) use of aspirin: Secondary | ICD-10-CM | POA: Insufficient documentation

## 2021-07-29 DIAGNOSIS — I252 Old myocardial infarction: Secondary | ICD-10-CM | POA: Diagnosis not present

## 2021-07-29 DIAGNOSIS — I200 Unstable angina: Secondary | ICD-10-CM

## 2021-07-29 DIAGNOSIS — Z20822 Contact with and (suspected) exposure to covid-19: Secondary | ICD-10-CM | POA: Insufficient documentation

## 2021-07-29 DIAGNOSIS — R7303 Prediabetes: Secondary | ICD-10-CM | POA: Insufficient documentation

## 2021-07-29 LAB — CBC
HCT: 41.8 % (ref 36.0–46.0)
Hemoglobin: 13.4 g/dL (ref 12.0–15.0)
MCH: 27.8 pg (ref 26.0–34.0)
MCHC: 32.1 g/dL (ref 30.0–36.0)
MCV: 86.7 fL (ref 80.0–100.0)
Platelets: 282 10*3/uL (ref 150–400)
RBC: 4.82 MIL/uL (ref 3.87–5.11)
RDW: 15 % (ref 11.5–15.5)
WBC: 5 10*3/uL (ref 4.0–10.5)
nRBC: 0 % (ref 0.0–0.2)

## 2021-07-29 LAB — BASIC METABOLIC PANEL
Anion gap: 6 (ref 5–15)
BUN: 15 mg/dL (ref 8–23)
CO2: 25 mmol/L (ref 22–32)
Calcium: 9.8 mg/dL (ref 8.9–10.3)
Chloride: 106 mmol/L (ref 98–111)
Creatinine, Ser: 0.89 mg/dL (ref 0.44–1.00)
GFR, Estimated: >60 mL/min (ref >60)
Glucose, Bld: 92 mg/dL (ref 70–99)
Potassium: 3.8 mmol/L (ref 3.5–5.1)
Sodium: 137 mmol/L (ref 135–145)

## 2021-07-29 LAB — RESP PANEL BY RT-PCR (FLU A&B, COVID) ARPGX2
Influenza A by PCR: NEGATIVE
Influenza B by PCR: NEGATIVE
SARS Coronavirus 2 by RT PCR: NEGATIVE

## 2021-07-29 LAB — TROPONIN I (HIGH SENSITIVITY)
Troponin I (High Sensitivity): 4 ng/L (ref <18)
Troponin I (High Sensitivity): 4 ng/L (ref <18)

## 2021-07-29 IMAGING — CR DG CHEST 2V
2 series · 2 of 2 positions shown · non-contrast
Comparison: CT a chest [DATE]

CLINICAL DATA: Chest pain, intermittent left-sided chest pain
radiating to left arm and back

EXAM:
CHEST - 2 VIEW

[chest pa]
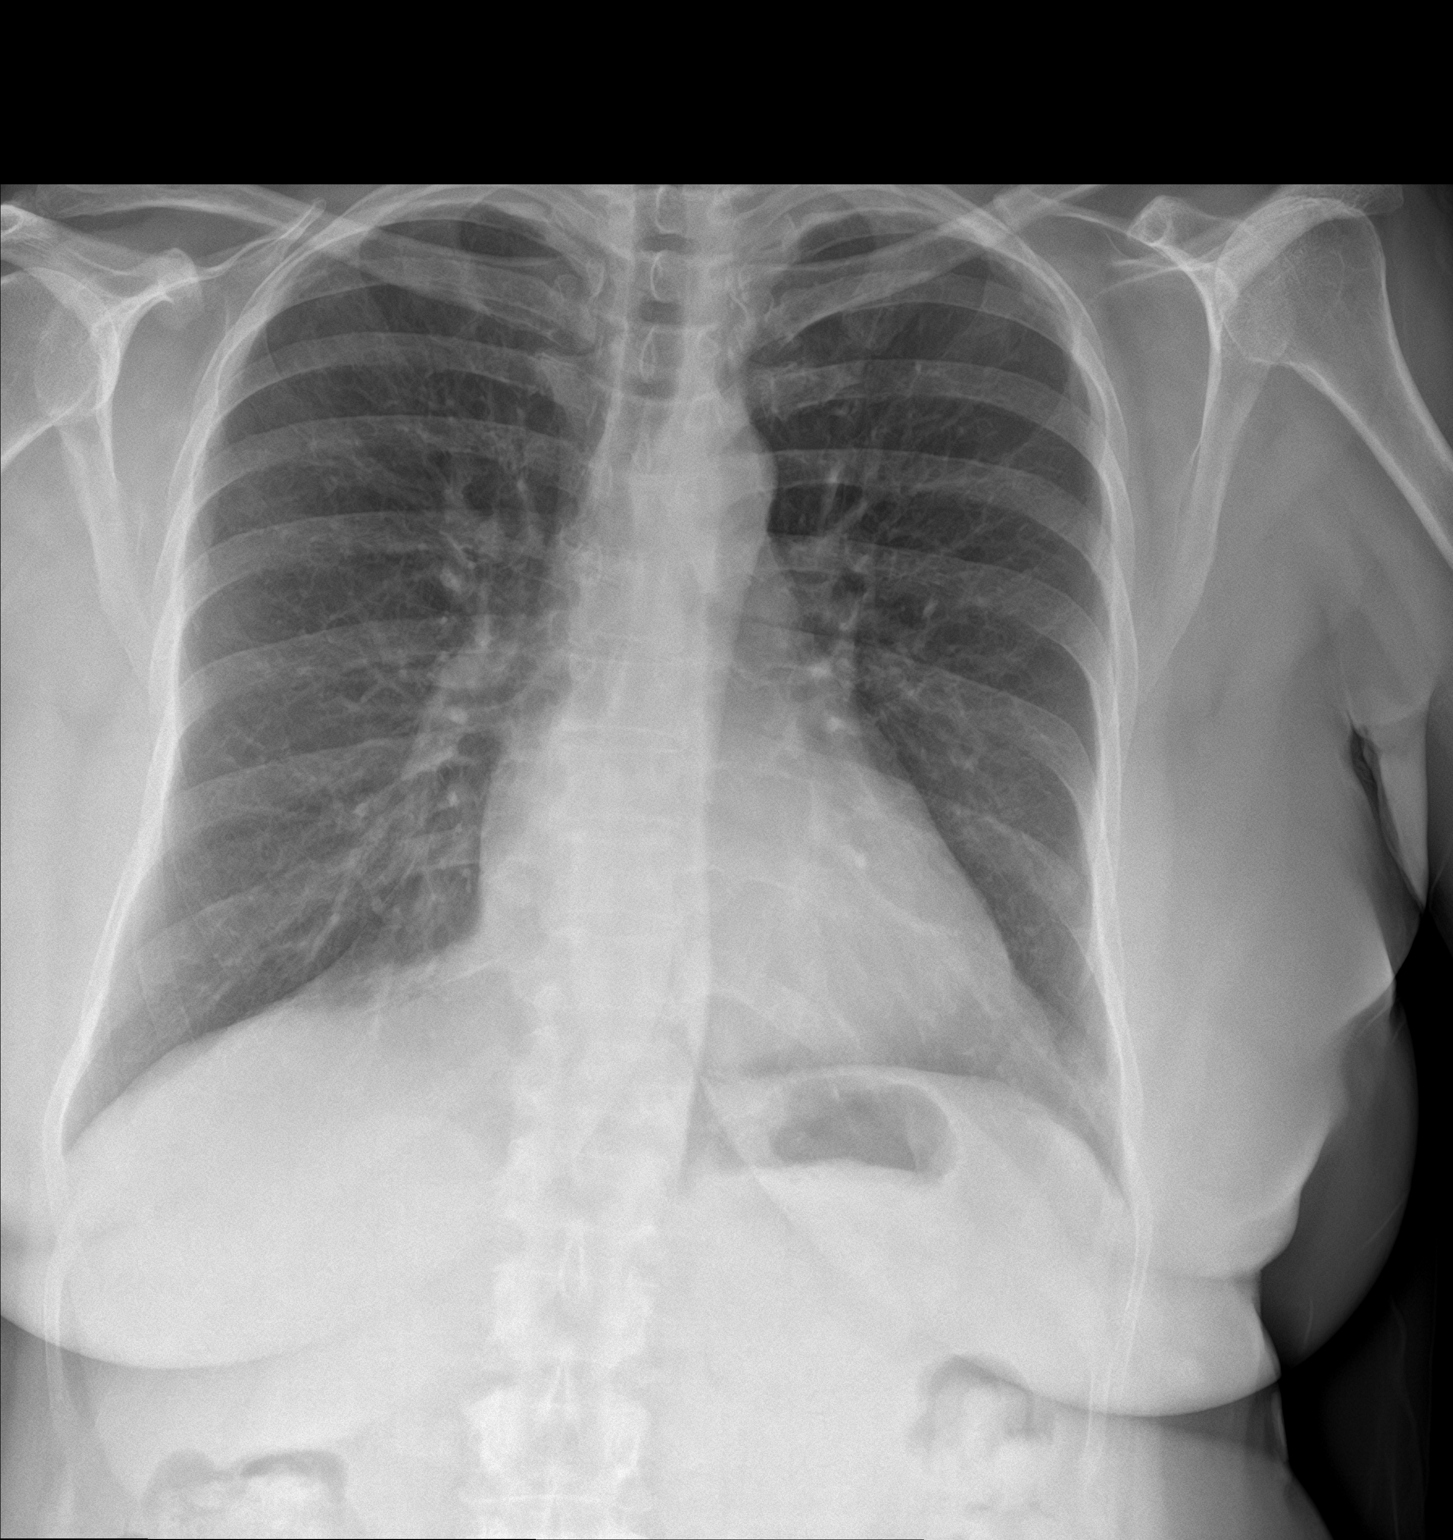

[chest lat]
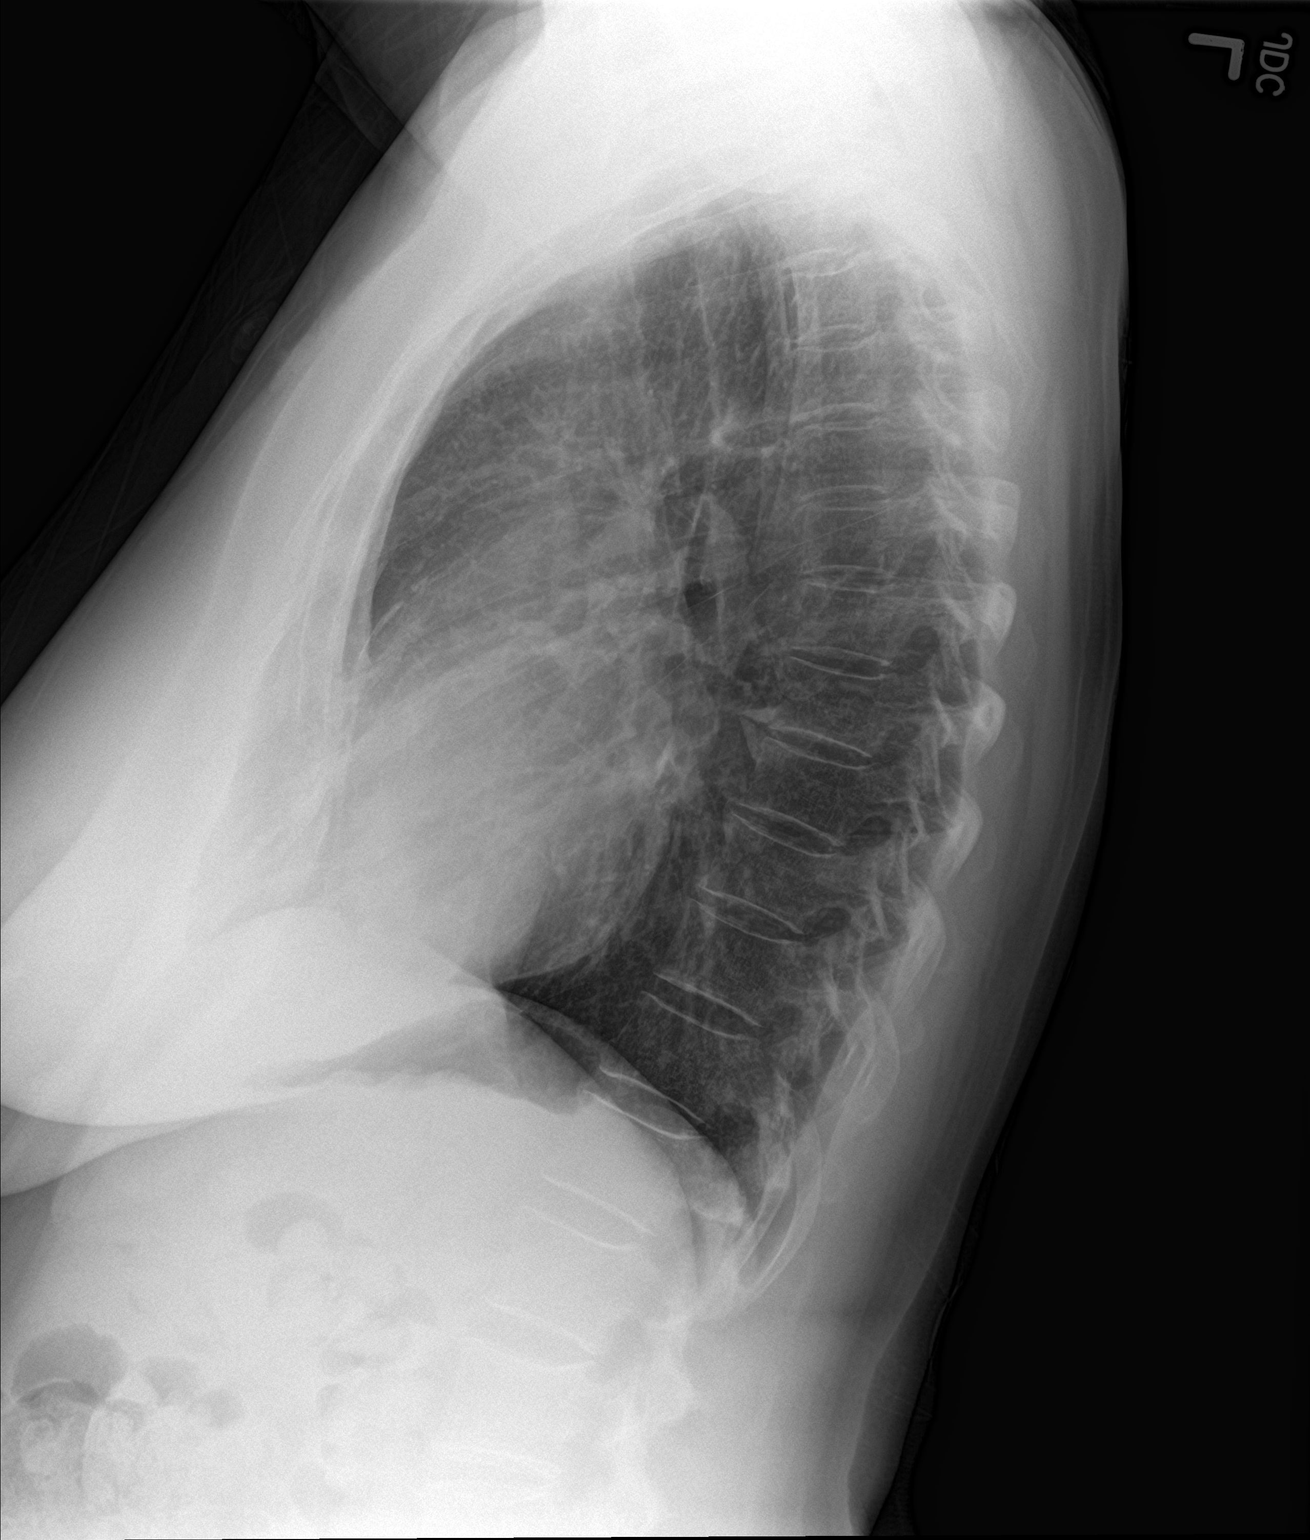

[2 of 2 positions shown; findings below may reference images not displayed]

FINDINGS: The cardiomediastinal silhouette is normal.

There is no focal consolidation or pulmonary edema. There is no
pleural effusion or pneumothorax.

There is no acute osseous abnormality.
IMPRESSION: No radiographic evidence of acute cardiopulmonary process.

## 2021-07-29 MED ORDER — ASPIRIN 81 MG PO CHEW
324.0000 mg | CHEWABLE_TABLET | Freq: Once | ORAL | Status: AC
  Administered 2021-07-29: 324 mg via ORAL

## 2021-07-29 MED ORDER — ISOSORBIDE MONONITRATE ER 60 MG PO TB24
ORAL_TABLET | ORAL | Status: AC
  Administered 2021-07-29: 30 mg via ORAL
  Filled 2021-07-29: qty 1

## 2021-07-29 MED ORDER — ASPIRIN 81 MG PO CHEW
CHEWABLE_TABLET | ORAL | Status: AC
  Filled 2021-07-29: qty 4

## 2021-07-29 MED ORDER — ISOSORBIDE MONONITRATE ER 60 MG PO TB24: 30.0000 mg | ORAL_TABLET | Freq: Once | ORAL | Status: AC

## 2021-07-29 NOTE — ED Provider Notes (Signed)
Emergency Medicine Provider Triage Evaluation Note  Renee Stephens , a 65 y.o. female  was evaluated in triage.  Pt complains of intermittent mid central chest pain without radiation for the past 4 weeks.  Patient states that she had prior MI in 2020.  She states that she has had some mild shortness of breath but no lower extremity swelling.  No new changes in her medications.  Patient states that she was referred by Dr. Etta Quill PA.  No current chest tightness or abdominal pain.  States that she has had some nausea.  Denies cough.  Review of Systems  Positive: Patient has mid-central chest pain.  Negative: No abdominal pain.   Physical Exam  There were no vitals taken for this visit. Gen:   Awake, no distress   Resp:  Normal effort  MSK:   Moves extremities without difficulty  Other:    Medical Decision Making  Medically screening exam initiated at 3:45 PM.  Appropriate orders placed.  Rockey Situ Udovich was informed that the remainder of the evaluation will be completed by another provider, this initial triage assessment does not replace that evaluation, and the importance of remaining in the ED until their evaluation is complete.     Vallarie Mare [place], PA-C 07/29/21 1546    Vladimir Crofts, MD 07/29/21 [phone]

## 2021-07-29 NOTE — ED Provider Notes (Signed)
[facility] Emergency Department Provider Note   ____________________________________________   Event Date/Time   First MD Initiated Contact with Patient 07/29/21 2146     (approximate)  I have reviewed the triage vital signs and the nursing notes.   HISTORY  Chief Complaint Chest Pain   HPI Renee Stephens is a 65 y.o. female who presents for chest pain  LOCATION: Left chest DURATION: 4 weeks prior to arrival TIMING: Intermittent and worsening since onset SEVERITY: Severe QUALITY: Chest pain CONTEXT: Patient states she has a history of MI in 2020 and has had similar symptoms left-sided chest pain that radiates to the left arm intermittently that has been worsening for the past month.  Patient spoke to Dr. Etta Quill PA who recommended they present to the emergency department for a heart catheterization in the morning. MODIFYING FACTORS: Worsened with exertion and partially relieved at rest and with Imdur ASSOCIATED SYMPTOMS: Left arm pain, DOE   Per medical record review, patient has history of CAD with MI in 2020 and PCI          Past Medical History:  Diagnosis Date   Anxiety    Coronary artery disease    Myocardial infarction [facility])     Patient Active Problem List   Diagnosis Date Noted   S/P drug eluting coronary stent placement 12/28/2018   Atrophic vaginitis 08/11/2017   Essential tremor 07/29/2016   Prediabetes 07/29/2016   Anxiety 05/31/2015   Menopausal syndrome (hot flashes) 05/31/2015   Osteopenia 05/31/2015   Restless legs 05/31/2015    Past Surgical History:  Procedure Laterality Date   ABDOMINAL HYSTERECTOMY  1995   CARDIAC CATHETERIZATION     COLONOSCOPY N/A 06/17/2020   Procedure: COLONOSCOPY;  Surgeon: Toledo, Benay Pike, MD;  Location: [facility] ENDOSCOPY;  Service: Gastroenterology;  Laterality: N/A;   CORONARY STENT INTERVENTION N/A 12/28/2018   Procedure: CORONARY STENT INTERVENTION;  Surgeon: Yolonda Kida, MD;  Location: [place] CV LAB;  Service: Cardiovascular;  Laterality: N/A;   LEFT HEART CATH AND CORONARY ANGIOGRAPHY Left 12/28/2018   Procedure: LEFT HEART CATH AND CORONARY ANGIOGRAPHY;  Surgeon: Yolonda Kida, MD;  Location: [place] CV LAB;  Service: Cardiovascular;  Laterality: Left;   LEFT HEART CATH AND CORONARY ANGIOGRAPHY N/A 11/01/2019   Procedure: LEFT HEART CATH AND CORONARY ANGIOGRAPHY;  Surgeon: Yolonda Kida, MD;  Location: [place] CV LAB;  Service: Cardiovascular;  Laterality: N/A;    Prior to Admission medications   Medication Sig Start Date End Date Taking? Authorizing Provider  acetaminophen (TYLENOL) 500 MG tablet Take 500 mg by mouth every 8 (eight) hours as needed for moderate pain or headache.    [provider]  aspirin EC 81 MG tablet Take 81 mg by mouth daily.    [provider]  clopidogrel (PLAVIX) 75 MG tablet Take 75 mg by mouth daily.    [provider]  diltiazem (CARDIZEM CD) 180 MG 24 hr capsule Take 180 mg by mouth daily.    [provider]  docusate sodium (COLACE) 100 MG capsule Take 100 mg by mouth daily as needed for mild constipation.    [provider]  FLUoxetine (PROZAC) 20 MG capsule Take 20 mg by mouth daily.     [provider]  furosemide (LASIX) 20 MG tablet Take 20 mg by mouth daily as needed for edema. 05/10/19   [provider]  isosorbide mononitrate (IMDUR) 30 MG 24 hr tablet Take 30 mg by  mouth daily.    [provider]  metoprolol succinate (TOPROL-XL) 25 MG 24 hr tablet Take 25 mg by mouth daily.    [provider]  omeprazole (PRILOSEC) 20 MG capsule Take 20 mg by mouth daily.    [provider]  primidone (MYSOLINE) 50 MG tablet Take 50 mg by mouth daily.    [provider]  rOPINIRole (REQUIP) 1 MG tablet Take 2 mg by mouth See admin instructions. Take 2 mg in the evening and 2 mg at bedtime    [provider]  rosuvastatin (CRESTOR) 40 MG tablet Take 1 tablet (40 mg total) by mouth daily at 6 PM. 12/28/18   Callwood, Dwayne D, MD  ticagrelor (BRILINTA) 90 MG TABS tablet Take 1 tablet (90 mg total) by mouth 2 (two) times daily. Patient not taking: Reported on 10/25/2019 12/28/18   Lujean Amel D, MD    Allergies Penicillin g  Family History  Problem Relation Age of Onset   Kidney failure Father    Kidney failure Mother    Breast cancer Neg Hx    Bladder Cancer Neg Hx     Social History Social History   Tobacco Use   Smoking status: Never   Smokeless tobacco: Never  Vaping Use   Vaping Use: Never used  Substance Use Topics   Alcohol use: No   Drug use: No    Review of Systems Constitutional: No fever/chills Eyes: No visual changes. ENT: No sore throat. Cardiovascular: Endorses chest pain. Respiratory: Endorses shortness of breath. Gastrointestinal: No abdominal pain.  No nausea, no vomiting.  No diarrhea. Genitourinary: Negative for dysuria. Musculoskeletal: Negative for acute arthralgias Skin: Negative for rash. Neurological: Negative for headaches, weakness/numbness/paresthesias in any extremity Psychiatric: Negative for suicidal ideation/homicidal ideation   ____________________________________________   PHYSICAL EXAM:  VITAL SIGNS: ED Triage Vitals [07/29/21 1551]  Enc Vitals Group     BP (!) 142/71     Pulse Rate 99     Resp 20     Temp 98.2 F (36.8 C)     Temp Source Oral     SpO2 100 %     Weight 175 lb (79.4 kg)     Height 5\' 6"  (1.676 m)     Head Circumference      Peak Flow      Pain Score 4     Pain Loc      Pain Edu?      Excl. in [place]?    Constitutional: Alert and oriented. Well appearing and in no acute distress. Eyes: Conjunctivae are normal. PERRL. Head: Atraumatic. Nose: No congestion/rhinnorhea. Mouth/Throat: Mucous membranes are moist. Neck: No stridor Cardiovascular: Grossly normal heart sounds.  Good peripheral  circulation. Respiratory: Normal respiratory effort.  No retractions. Gastrointestinal: Soft and nontender. No distention. Musculoskeletal: No obvious deformities Neurologic:  Normal speech and language. No gross focal neurologic deficits are appreciated. Skin:  Skin is warm and dry. No rash noted. Psychiatric: Mood and affect are normal. Speech and behavior are normal.  ____________________________________________   LABS (all labs ordered are listed, but only abnormal results are displayed)  Labs Reviewed  RESP PANEL BY RT-PCR (FLU A&B, COVID) [id number]  BASIC METABOLIC PANEL  CBC  TROPONIN I (HIGH SENSITIVITY)  TROPONIN I (HIGH SENSITIVITY)   ____________________________________________  EKG  ED ECG REPORT I, Naaman Plummer, the attending physician, personally viewed and interpreted this ECG.  Date: 07/29/2021 EKG Time: 1544 Rate: 98 Rhythm: normal sinus rhythm QRS Axis: normal  Intervals: normal ST/T Wave abnormalities: normal Narrative Interpretation: no evidence of acute ischemia  ____________________________________________  RADIOLOGY  ED MD interpretation: 2 view chest x-ray shows no evidence of acute abnormalities including no pneumonia, pneumothorax, or widened mediastinum  Official radiology report(s): DG Chest 2 View  Result Date: 07/29/2021 CLINICAL DATA:  Chest pain, intermittent left-sided chest pain radiating to left arm and back EXAM: CHEST - 2 VIEW COMPARISON:  CT a chest 07/09/2020 FINDINGS: The cardiomediastinal silhouette is normal. There is no focal consolidation or pulmonary edema. There is no pleural effusion or pneumothorax. There is no acute osseous abnormality. IMPRESSION: No radiographic evidence of acute cardiopulmonary process. Electronically Signed   By: Valetta Mole M.D.   On: 07/29/2021 16:35    ____________________________________________   PROCEDURES  Procedure(s) performed (including Critical Care):  .1-3 Lead EKG  Interpretation Performed by: Naaman Plummer, MD Authorized by: Naaman Plummer, MD     Interpretation: normal     ECG rate:  98   ECG rate assessment: normal     Rhythm: sinus rhythm     Ectopy: none     Conduction: normal     ____________________________________________   INITIAL IMPRESSION / ASSESSMENT AND PLAN / ED COURSE  As part of my medical decision making, I reviewed the following data within the electronic medical record, if available:  Nursing notes reviewed and incorporated, Labs reviewed, EKG interpreted, Old chart reviewed, Radiograph reviewed and Notes from prior ED visits reviewed and incorporated      Patient is a 65 year old female that presents with chest pain concerning for signs and symptoms of unstable angina Workup: ECG, CXR, CBC, BMP, Troponin Findings: ECG: No overt evidence of STEMI. No evidence of Brugadas sign, delta wave, epsilon wave, significantly prolonged QTc, or malignant arrhythmia HS Troponin: Negative x1 Other Labs unremarkable for emergent problems. CXR: Without PTX, PNA, or widened mediastinum Last Stress Test: Never Last Heart Catheterization:  2020 HEART Score: 5  Given History, Exam, and Workup I have low suspicion for ACS, Pneumothorax, Pneumonia, Pulmonary Embolus, Tamponade, Aortic Dissection or other emergent problem as a cause for this presentation.   High Risk Chest Pain Patient at increased risk for Major Adverse Cardiac Event (AMI, PCI, CABG, death) Interventions: ASA 324mg  Defer Heparin drip as patient pain free at this time,   Disposition: Admit for continued cardiac monitoring and trending of troponins as well as further evaluation for potential inpatient stress testing vs cardiac catheterization and coronary angiography.      ____________________________________________   FINAL CLINICAL IMPRESSION(S) / ED DIAGNOSES  Final diagnoses:  Unstable angina [facility])     ED Discharge Orders     None        Note:   This document was prepared using Dragon voice recognition software and may include unintentional dictation errors.    Naaman Plummer, MD 07/29/21 [phone]

## 2021-07-29 NOTE — H&P (Signed)
History and Physical    Renee Stephens BJS:[id number] DOB: 11-Jun-1956 DOA: 07/29/2021  PCP: Idelle Crouch, MD   Patient coming from: home  I have personally briefly reviewed patient's relevant medical records in [place]  Chief Complaint: chest pain  HPI: Renee Stephens is a 65 y.o. 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  Patient states she has been having intermittent chest pain for the past 4 weeks.  The episodes are unrelated to exertion, precordial radiating to the left arm and back like tightness, lasting 2 to 5 minutes.  Intensity is up to an 8 out of 10 and she has 10-20 episodes a day.  Over the past week the episodes are getting more intense and more frequent.    She denies associated nausea vomiting or diaphoresis. She has not been using her Imdur nor does she have nitroglycerin.  Denies cough, fever or chills or lower extremity pain or swelling.  She is currently chest pain-free but has had a few episodes since arrival in the ED  ED course: BP 142/71 with otherwise normal vitals Labs: Troponin 4-4 and BMP and CBC within normal limits  EKG, personally viewed and interpreted: NSR at 98 with RBBB and T wave inversion V2 V3 aVF  Chest x-ray with no acute disease  Patient given a dose of aspirin.  Hospitalist consulted for admission   Review of Systems: As per HPI otherwise all other systems on review of systems negative.   Assessment/Plan    Unstable angina   Coronary artery disease   History of MI (myocardial infarction) -Patient currently chest pain-free but having several episodes of chest pain - Heparin infusion - Continue aspirin and Plavix, metoprolol, rosuvastatin - Continue  isosorbide mononitrate with NTG as needed chest pain - Cardiology consult to evaluate for cath - We will keep patient n.p.o. tonight  Restless leg syndrome with hemibody parkinsonism - Continue ropinirole and primidone - Patient is followed by neurologist Dr. Manuella Ghazi  Depression - Continue Prozac   DVT prophylaxis: Lovenox  Code Status: full code  Family Communication: Husband at bedside Disposition Plan: Back to previous home environment Consults called: none  Status: Patient    Physical Exam: Vitals:   07/29/21 1551  BP: (!) 142/71  Pulse: 99  Resp: 20  Temp: 98.2 F (36.8 C)  TempSrc: Oral  SpO2: 100%  Weight: 79.4 kg  Height: 5\' 6"  (1.676 m)   Constitutional: Alert, oriented x 3 . Not in any apparent distress HEENT:      Head: Normocephalic and atraumatic.         Eyes: PERLA, EOMI, Conjunctivae are normal. Sclera is non-icteric.       Mouth/Throat: Mucous membranes are moist.       Neck: Supple with no signs of meningismus. Cardiovascular: Regular rate and rhythm. No murmurs, gallops, or rubs. 2+ symmetrical distal pulses are present . No JVD. No  LE edema Respiratory: Respiratory effort normal .Lungs sounds clear bilaterally. No wheezes, crackles, or rhonchi.  Gastrointestinal: Soft, non tender, non distended. Positive bowel sounds.  Genitourinary: No CVA tenderness. Musculoskeletal: Nontender with normal range of motion in all extremities. No cyanosis, or erythema of extremities. Neurologic:  Face is symmetric. Moving all extremities. No gross  focal neurologic deficits . Skin: Skin is warm, dry.  No rash or ulcers Psychiatric: Mood and affect are appropriate     Past Medical History:  Diagnosis Date   Anxiety    Coronary artery disease    Myocardial infarction [facility])     Past Surgical History:  Procedure Laterality Date   ABDOMINAL HYSTERECTOMY  1995   CARDIAC CATHETERIZATION     COLONOSCOPY N/A 06/17/2020   Procedure: COLONOSCOPY;  Surgeon: Toledo,  Benay Pike, MD;  Location: [facility] ENDOSCOPY;  Service: Gastroenterology;  Laterality: N/A;   CORONARY STENT INTERVENTION N/A 12/28/2018   Procedure: CORONARY STENT INTERVENTION;  Surgeon: Yolonda Kida, MD;  Location: [place] CV LAB;  Service: Cardiovascular;  Laterality: N/A;   LEFT HEART CATH AND CORONARY ANGIOGRAPHY Left 12/28/2018   Procedure: LEFT HEART CATH AND CORONARY ANGIOGRAPHY;  Surgeon: Yolonda Kida, MD;  Location: [place] CV LAB;  Service: Cardiovascular;  Laterality: Left;   LEFT HEART CATH AND CORONARY ANGIOGRAPHY N/A 11/01/2019   Procedure: LEFT HEART CATH AND CORONARY ANGIOGRAPHY;  Surgeon: Yolonda Kida, MD;  Location: [place] CV LAB;  Service: Cardiovascular;  Laterality: N/A;     reports that she has never smoked. She has never used smokeless tobacco. She reports that she does not drink alcohol and does not use drugs.  Allergies  Allergen Reactions   Penicillin G Swelling and Rash    Did it involve swelling of the face/tongue/throat, SOB, or low BP? Yes Did it involve sudden or severe rash/hives, skin peeling, or any reaction on the inside of your mouth or nose? No Did you need to seek medical attention at a hospital or doctor's office? Yes When did it last happen?      childhood allergy If all above answers are "NO", may proceed with cephalosporin use.     Family History  Problem Relation Age of Onset   Kidney failure Father    Kidney failure Mother    Breast cancer Neg Hx    Bladder Cancer Neg Hx       Prior to Admission medications   Medication Sig Start Date End Date Taking? Authorizing Provider  acetaminophen (TYLENOL) 500 MG tablet Take 500 mg by mouth every 8 (eight) hours as needed for moderate pain or headache.    [provider]  aspirin EC 81 MG tablet Take 81 mg by mouth daily.    [provider]  clopidogrel (PLAVIX) 75 MG tablet Take 75 mg by mouth daily.    [provider]  diltiazem (CARDIZEM  CD) 180 MG 24 hr capsule Take 180 mg by mouth daily.    [provider]  docusate sodium (COLACE) 100 MG capsule Take 100 mg by mouth daily as needed for mild constipation.    [provider]  FLUoxetine (PROZAC) 20 MG capsule Take 20 mg by mouth daily.     [provider]  furosemide (LASIX) 20 MG tablet Take 20 mg by mouth daily as needed for edema. 05/10/19   [provider]  isosorbide mononitrate (IMDUR) 30 MG 24 hr tablet Take 30 mg by mouth daily.    [provider]  metoprolol succinate (TOPROL-XL) 25 MG 24 hr tablet Take 25 mg by mouth daily.    [provider]  omeprazole (PRILOSEC) 20 MG capsule Take 20 mg by mouth daily.    [provider]  primidone (MYSOLINE) 50 MG tablet Take 50 mg by mouth daily.    [provider]  rOPINIRole (REQUIP) 1 MG tablet Take 2 mg by mouth See admin instructions. Take 2 mg in the evening and 2 mg at bedtime    [provider]  rosuvastatin (CRESTOR) 40 MG tablet Take 1 tablet (40 mg total) by mouth daily at 6 PM. 12/28/18   Callwood, Dwayne D, MD  ticagrelor (BRILINTA) 90 MG TABS tablet Take 1 tablet (90 mg total) by mouth 2 (two) times daily. Patient not taking: Reported on 10/25/2019 12/28/18   Yolonda Kida, MD      Labs on Admission: I have personally reviewed following labs and imaging studies  CBC: Recent Labs  Lab 07/29/21 1550  WBC 5.0  HGB 13.4  HCT 41.8  MCV 86.7  PLT 106   Basic Metabolic Panel: Recent Labs  Lab 07/29/21 1550  NA 137  K 3.8  CL 106  CO2 25  GLUCOSE 92  BUN 15  CREATININE 0.89  CALCIUM 9.8   GFR: Estimated Creatinine Clearance: 67.8 mL/min (by C-G formula based on SCr of 0.89 mg/dL). Liver Function Tests: No results for input(s): AST, ALT, ALKPHOS, BILITOT, PROT, ALBUMIN in the last 168 hours. No results for input(s): LIPASE, AMYLASE in the last 168 hours. No results for input(s): AMMONIA in the last 168  hours. Coagulation Profile: No results for input(s): INR, PROTIME in the last 168 hours. Cardiac Enzymes: No results for input(s): CKTOTAL, CKMB, CKMBINDEX, TROPONINI in the last 168 hours. BNP (last 3 results) No results for input(s): PROBNP in the last 8760 hours. HbA1C: No results for input(s): HGBA1C in the last 72 hours. CBG: No results for input(s): GLUCAP in the last 168 hours. Lipid Profile: No results for input(s): CHOL, HDL, LDLCALC, TRIG, CHOLHDL, LDLDIRECT in the last 72 hours. Thyroid Function Tests: No results for input(s): TSH, T4TOTAL, FREET4, T3FREE, THYROIDAB in the last 72 hours. Anemia Panel: No results for input(s): VITAMINB12, FOLATE, FERRITIN, TIBC, IRON, RETICCTPCT in the last 72 hours. Urine analysis:    Component Value Date/Time   COLORURINE AMBER (A) 03/11/2017 0009   APPEARANCEUR Clear 04/09/2017 0813   LABSPEC 1.029 03/11/2017 0009   PHURINE 5.0 03/11/2017 0009   GLUCOSEU Negative 04/09/2017 0813   HGBUR MODERATE (A) 03/11/2017 0009   BILIRUBINUR Negative 04/09/2017 0813   KETONESUR 20 (A) 03/11/2017 0009   PROTEINUR Negative 04/09/2017 0813   PROTEINUR NEGATIVE 03/11/2017 0009   NITRITE Negative 04/09/2017 0813   NITRITE NEGATIVE 03/11/2017 0009   LEUKOCYTESUR Trace (A) 04/09/2017 0813    Radiological Exams on Admission: DG Chest 2 View  Result Date: 07/29/2021 CLINICAL DATA:  Chest pain, intermittent left-sided chest pain radiating to left arm and back EXAM: CHEST - 2 VIEW COMPARISON:  CT a chest 07/09/2020 FINDINGS: The cardiomediastinal silhouette is normal. There is no focal consolidation or pulmonary edema. There is no pleural effusion or pneumothorax. There is no acute osseous abnormality. IMPRESSION: No radiographic evidence of acute cardiopulmonary process. Electronically Signed   By: Valetta Mole M.D.   On: 07/29/2021 16:35       Athena Masse MD [facility]   07/29/2021, 10:46 PM

## 2021-07-29 NOTE — ED Notes (Signed)
ED Provider at bedside. 

## 2021-07-29 NOTE — ED Triage Notes (Signed)
Pt to ED for left sided intermittent chest pain radiating to left arm and to back for the past month. +shob and nausea.  NAD noted

## 2021-07-30 ENCOUNTER — Encounter
Admission: EM | Disposition: A | Discharge status: Home / Self Care | Payer: Self-pay | Source: Home / Self Care | Attending: Emergency Medicine

## 2021-07-30 DIAGNOSIS — I200 Unstable angina: Secondary | ICD-10-CM | POA: Diagnosis not present

## 2021-07-30 HISTORY — PX: LEFT HEART CATH AND CORONARY ANGIOGRAPHY: CATH118249

## 2021-07-30 LAB — CBC
HCT: 37.4 % (ref 36.0–46.0)
Hemoglobin: 11.8 g/dL — ABNORMAL LOW (ref 12.0–15.0)
MCH: 27.7 pg (ref 26.0–34.0)
MCHC: 31.6 g/dL (ref 30.0–36.0)
MCV: 87.8 fL (ref 80.0–100.0)
Platelets: 258 10*3/uL (ref 150–400)
RBC: 4.26 MIL/uL (ref 3.87–5.11)
RDW: 15.3 % (ref 11.5–15.5)
WBC: 5.4 10*3/uL (ref 4.0–10.5)
nRBC: 0 % (ref 0.0–0.2)

## 2021-07-30 LAB — PROTIME-INR
INR: 1.1 (ref 0.8–1.2)
Prothrombin Time: 14.2 seconds (ref 11.4–15.2)

## 2021-07-30 LAB — HEPARIN LEVEL (UNFRACTIONATED): Heparin Unfractionated: >1.1 IU/mL — ABNORMAL HIGH (ref 0.30–0.70)

## 2021-07-30 LAB — CREATININE, SERUM
Creatinine, Ser: 0.9 mg/dL (ref 0.44–1.00)
GFR, Estimated: >60 mL/min (ref >60)

## 2021-07-30 LAB — APTT: aPTT: 162 seconds (ref 24–36)

## 2021-07-30 SURGERY — LEFT HEART CATH AND CORONARY ANGIOGRAPHY
Anesthesia: Moderate Sedation

## 2021-07-30 MED ORDER — HEPARIN SODIUM (PORCINE) 1000 UNIT/ML IJ SOLN
INTRAMUSCULAR | Status: AC
  Filled 2021-07-30: qty 10

## 2021-07-30 MED ORDER — FUROSEMIDE 20 MG PO TABS
20.0000 mg | ORAL_TABLET | Freq: Once | ORAL | Status: AC
  Administered 2021-07-30: 20 mg via ORAL
  Filled 2021-07-30: qty 1

## 2021-07-30 MED ORDER — DILTIAZEM HCL ER COATED BEADS 180 MG PO CP24
180.0000 mg | ORAL_CAPSULE | Freq: Every day | ORAL | Status: DC
  Administered 2021-07-30: 180 mg via ORAL
  Filled 2021-07-30: qty 1

## 2021-07-30 MED ORDER — MIDAZOLAM HCL 2 MG/2ML IJ SOLN
INTRAMUSCULAR | Status: DC | PRN
  Administered 2021-07-30: 1 mg via INTRAVENOUS

## 2021-07-30 MED ORDER — ROPINIROLE HCL 1 MG PO TABS: 2.0000 mg | ORAL_TABLET | Freq: Two times a day (BID) | ORAL | Status: DC

## 2021-07-30 MED ORDER — HEPARIN (PORCINE) 25000 UT/250ML-% IV SOLN
1000.0000 [IU]/h | INTRAVENOUS | Status: DC
  Administered 2021-07-30: 1000 [IU]/h via INTRAVENOUS
  Filled 2021-07-30: qty 250

## 2021-07-30 MED ORDER — IOHEXOL 350 MG/ML SOLN
INTRAVENOUS | Status: DC | PRN
  Administered 2021-07-30: 20 mL

## 2021-07-30 MED ORDER — PRIMIDONE 50 MG PO TABS
50.0000 mg | ORAL_TABLET | Freq: Every day | ORAL | Status: DC
Start: 2021-07-30 — End: 2021-07-30

## 2021-07-30 MED ORDER — PANTOPRAZOLE SODIUM 40 MG PO TBEC
40.0000 mg | DELAYED_RELEASE_TABLET | Freq: Every day | ORAL | Status: DC
  Administered 2021-07-30: 40 mg via ORAL
  Filled 2021-07-30: qty 1

## 2021-07-30 MED ORDER — METOPROLOL SUCCINATE ER 25 MG PO TB24: 25.0000 mg | ORAL_TABLET | Freq: Every day | ORAL | Status: DC

## 2021-07-30 MED ORDER — HEPARIN (PORCINE) IN NACL 1000-0.9 UT/500ML-% IV SOLN
INTRAVENOUS | Status: DC | PRN
  Administered 2021-07-30: 1000 mL

## 2021-07-30 MED ORDER — ASPIRIN EC 81 MG PO TBEC: 81.0000 mg | DELAYED_RELEASE_TABLET | Freq: Every day | ORAL | 0 refills | Status: AC

## 2021-07-30 MED ORDER — HEPARIN SODIUM (PORCINE) 1000 UNIT/ML IJ SOLN
INTRAMUSCULAR | Status: DC | PRN
  Administered 2021-07-30: 4000 [IU] via INTRAVENOUS

## 2021-07-30 MED ORDER — VERAPAMIL HCL 2.5 MG/ML IV SOLN
INTRAVENOUS | Status: AC
  Filled 2021-07-30: qty 2

## 2021-07-30 MED ORDER — LIDOCAINE HCL 1 % IJ SOLN
INTRAMUSCULAR | Status: AC
  Filled 2021-07-30: qty 20

## 2021-07-30 MED ORDER — HEPARIN (PORCINE) IN NACL 1000-0.9 UT/500ML-% IV SOLN
INTRAVENOUS | Status: AC
  Filled 2021-07-30: qty 1000

## 2021-07-30 MED ORDER — ENOXAPARIN SODIUM 40 MG/0.4ML IJ SOSY: 40.0000 mg | PREFILLED_SYRINGE | INTRAMUSCULAR | Status: DC

## 2021-07-30 MED ORDER — SODIUM CHLORIDE 0.9 % IV SOLN: 250.0000 mL | INTRAVENOUS | Status: DC | PRN

## 2021-07-30 MED ORDER — DOCUSATE SODIUM 100 MG PO CAPS: 100.0000 mg | ORAL_CAPSULE | Freq: Every day | ORAL | Status: DC | PRN

## 2021-07-30 MED ORDER — FLUOXETINE HCL 20 MG PO CAPS
20.0000 mg | ORAL_CAPSULE | Freq: Every day | ORAL | Status: DC
  Filled 2021-07-30: qty 1

## 2021-07-30 MED ORDER — SODIUM CHLORIDE 0.9% FLUSH: 3.0000 mL | Freq: Two times a day (BID) | INTRAVENOUS | Status: DC

## 2021-07-30 MED ORDER — ROSUVASTATIN CALCIUM 10 MG PO TABS
40.0000 mg | ORAL_TABLET | Freq: Every day | ORAL | Status: DC
  Administered 2021-07-30: 40 mg via ORAL
  Filled 2021-07-30: qty 4

## 2021-07-30 MED ORDER — VERAPAMIL HCL 2.5 MG/ML IV SOLN
INTRAVENOUS | Status: DC | PRN
  Administered 2021-07-30: 2.5 mg via INTRA_ARTERIAL

## 2021-07-30 MED ORDER — HEPARIN (PORCINE) 25000 UT/250ML-% IV SOLN
800.0000 [IU]/h | INTRAVENOUS | Status: DC
Start: 2021-07-30 — End: 2021-07-30

## 2021-07-30 MED ORDER — FENTANYL CITRATE (PF) 100 MCG/2ML IJ SOLN
INTRAMUSCULAR | Status: DC | PRN
  Administered 2021-07-30: 25 ug via INTRAVENOUS

## 2021-07-30 MED ORDER — MIDAZOLAM HCL 2 MG/2ML IJ SOLN
INTRAMUSCULAR | Status: AC
  Filled 2021-07-30: qty 2

## 2021-07-30 MED ORDER — LIDOCAINE HCL (PF) 1 % IJ SOLN
INTRAMUSCULAR | Status: DC | PRN
  Administered 2021-07-30: 2 mL

## 2021-07-30 MED ORDER — SODIUM CHLORIDE 0.9% FLUSH: 3.0000 mL | INTRAVENOUS | Status: DC | PRN

## 2021-07-30 MED ORDER — HEPARIN BOLUS VIA INFUSION
4000.0000 [IU] | Freq: Once | INTRAVENOUS | Status: AC
  Administered 2021-07-30: 4000 [IU] via INTRAVENOUS
  Filled 2021-07-30: qty 4000

## 2021-07-30 MED ORDER — SODIUM CHLORIDE 0.9 % IV SOLN: INTRAVENOUS | Status: DC

## 2021-07-30 MED ORDER — ISOSORBIDE MONONITRATE ER 30 MG PO TB24
30.0000 mg | ORAL_TABLET | Freq: Every day | ORAL | Status: DC
  Administered 2021-07-30: 30 mg via ORAL
  Filled 2021-07-30: qty 1

## 2021-07-30 MED ORDER — ACETAMINOPHEN 500 MG PO TABS: 500.0000 mg | ORAL_TABLET | Freq: Three times a day (TID) | ORAL | Status: DC | PRN

## 2021-07-30 MED ORDER — FUROSEMIDE 40 MG PO TABS: 40.0000 mg | ORAL_TABLET | Freq: Once | ORAL | Status: DC

## 2021-07-30 MED ORDER — CLOPIDOGREL BISULFATE 75 MG PO TABS
75.0000 mg | ORAL_TABLET | Freq: Every day | ORAL | Status: DC
  Administered 2021-07-30: 75 mg via ORAL
  Filled 2021-07-30: qty 1

## 2021-07-30 MED ORDER — ASPIRIN EC 81 MG PO TBEC
81.0000 mg | DELAYED_RELEASE_TABLET | Freq: Every day | ORAL | Status: DC
  Administered 2021-07-30: 81 mg via ORAL
  Filled 2021-07-30: qty 1

## 2021-07-30 MED ORDER — FENTANYL CITRATE (PF) 100 MCG/2ML IJ SOLN
INTRAMUSCULAR | Status: AC
  Filled 2021-07-30: qty 2

## 2021-07-30 SURGICAL SUPPLY — 11 items
CATH 5FR JL3.5 JR4 ANG PIG MP (CATHETERS) ×2 IMPLANT
DEVICE RAD TR BAND REGULAR (VASCULAR PRODUCTS) ×2 IMPLANT
DRAPE BRACHIAL (DRAPES) ×2 IMPLANT
GLIDESHEATH SLEND SS 6F .021 (SHEATH) ×2 IMPLANT
GUIDEWIRE INQWIRE 1.5J.035X260 (WIRE) ×1 IMPLANT
INQWIRE 1.5J .035X260CM (WIRE) ×2
PACK CARDIAC CATH (CUSTOM PROCEDURE TRAY) ×2 IMPLANT
PROTECTION STATION PRESSURIZED (MISCELLANEOUS) ×2
SET ATX SIMPLICITY (MISCELLANEOUS) ×2 IMPLANT
STATION PROTECTION PRESSURIZED (MISCELLANEOUS) ×1 IMPLANT
WIRE NITINOL .018 (WIRE) ×2 IMPLANT

## 2021-07-30 NOTE — Progress Notes (Signed)
[place] for heparin infusion Indication: ACS/STEMI  Allergies  Allergen Reactions   Penicillin G Swelling and Rash    Did it involve swelling of the face/tongue/throat, SOB, or low BP? Yes Did it involve sudden or severe rash/hives, skin peeling, or any reaction on the inside of your mouth or nose? No Did you need to seek medical attention at a hospital or doctor's office? Yes When did it last happen?      childhood allergy If all above answers are "NO", may proceed with cephalosporin use.     Patient Measurements: Height: 5\' 6"  (167.6 cm) Weight: 78.7 kg (173 lb 8 oz) IBW/kg (Calculated) : 59.3 Heparin Dosing Weight: 75.5 kg  Vital Signs: Temp: 98 F (36.7 C) (11/30 0333) Temp Source: Oral (11/30 0333) BP: 128/68 (11/30 0333) Pulse Rate: 81 (11/30 0333)  Labs: Recent Labs    07/29/21 1550 07/29/21 1754  HGB 13.4  --   HCT 41.8  --   PLT 282  --   CREATININE 0.89  --   TROPONINIHS 4 4    Estimated Creatinine Clearance: 67.6 mL/min (by C-G formula based on SCr of 0.89 mg/dL).   Medical History: Past Medical History:  Diagnosis Date   Anxiety    Coronary artery disease    Myocardial infarction [facility])     Assessment: Pt is 65 yo female w/ hx of MI s/p LAD stent presenting to ED c/o intermittent CP x 4 wks, getting more intense and frequent, being started on heparin pending cardiology consult to evaluate for cath procedure.  Goal of Therapy:  Heparin level 0.3-0.7 units/ml Monitor platelets by anticoagulation protocol: Yes   Plan:  Bolus 4000 units x 1 Start heparin infusion at 1000 units/hr Will check HL in 6 hr after start of infusion CBC daily while on heparin.  Renda Rolls, PharmD, [facility] 07/30/2021 4:46 AM

## 2021-07-30 NOTE — Progress Notes (Signed)
Initial Nutrition Assessment  DOCUMENTATION CODES:   Not applicable  INTERVENTION:   -Once diet is advanced, add:   -Ensure Enlive po BID, each supplement provides 350 kcal and 20 grams of protein  -MVI with minerals daily  NUTRITION DIAGNOSIS:   Increased nutrient needs related to acute illness as evidenced by estimated needs.  GOAL:   Patient will meet greater than or equal to 90% of their needs  MONITOR:   PO intake, Supplement acceptance, Diet advancement, Labs, Weight trends, Skin, I & O's  REASON FOR ASSESSMENT:   Malnutrition Screening Tool    ASSESSMENT:   Renee Stephens is a 65 y.o. 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  Patient states she has been having intermittent chest pain for the past 4 weeks.  The episodes are unrelated to exertion, precordial radiating to the left arm and back like tightness, lasting 2 to 5 minutes.  Intensity is up to an 8 out of 10 and she has 10-20 episodes a day.  Over the past week the episodes are getting more intense and more frequent.    She denies associated nausea vomiting or diaphoresis. She has not been using her Imdur nor does she have nitroglycerin.  Denies cough, fever or chills or lower extremity pain or swelling.  She is currently chest pain-free but has had a few episodes since arrival in the ED  Pt admitted with chest pain and unstable angina.   Reviewed I/O's: +10 ml x 24 hours  UOP: 100 ml x 24 hours   Attempted to speak with pt x 3. Pt either receiving personal care or in cath lab at time of visit. Unable to obtain further nutrition-related history or complete nutrition-focused physical exam at this time.   Reviewed wt hx; pt has experienced a  6.2% wt loss over the past 13 months, which is not significant for time frame.   Pt with increased nutritional needs and would benefit from addition of oral nutrition supplements.   Medications reviewed and include cardizem.   Labs reviewed.   Diet Order:   Diet Order             Diet Heart Room service appropriate? Yes; Fluid consistency: Thin  Diet effective now                   EDUCATION NEEDS:   No education needs have been identified at this time  Skin:  Skin Assessment: Reviewed RN Assessment  Last BM:  07/29/21  Height:   Ht Readings from Last 1 Encounters:  07/30/21 5\' 6"  (1.676 m)    Weight:   Wt Readings from Last 1 Encounters:  07/30/21 78.6 kg    Ideal Body Weight:  59.1 kg  BMI:  Body mass index is 27.97 kg/m.  Estimated Nutritional Needs:   Kcal:  7262-0355  Protein:  90-105 grams  Fluid:  > 1.7 L    Loistine Chance, RD, LDN, [place] Registered Dietitian II Certified Diabetes Care and Education Specialist Please refer to [facility] for RD and/or RD on-call/weekend/after hours pager

## 2021-07-30 NOTE — Discharge Summary (Signed)
[facility]  Physician Discharge Summary  Renee Stephens YIR:[id number] DOB: 12-22-1955 DOA: 07/29/2021  PCP: Renee Crouch, MD  Admit date: 07/29/2021 Discharge date: 07/30/2021  Admitted From: Home Discharge disposition: Home   Code Status: Full Code   Discharge Diagnosis:   Principal Problem:   Chest pain Active Problems:   Coronary artery disease   History of MI (myocardial infarction)   Chief Complaint  Patient presents with   Chest Pain    Brief narrative: Renee Stephens is a 65 y.o. 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  Patient was sent to the ED on 11/29 by his cardiologist Renee Stephens for evaluation of chest pain which she had on and off for last 4 weeks, unrelated to exertion.   In the ED, patient was hemodynamically stable. Troponin not significantly elevated.  EKG showed normal sinus rhythm with RBBB, nonspecific ST-T wave changes. Because of her significant history of coronary disease requiring stent and ongoing chest pain, a decision was made to undergo cardiac cath.    Subjective: Patient was seen and examined this morning.  Pleasant middle-aged Caucasian female.  Lying down in bed.  Not in distress.  No new symptoms.   Underwent cardiac cath.  Per cardiologist, cardiac cath was clean  Hospital course: Unstable angina History of CAD/MI s/p stents -Cardiac consult appreciated.  Heparin drip initiated. -Underwent cardiac cath, per cardiologist cardiac cath was clean. -Continue aspirin, Plavix, Coreg, Imdur and Crestor  ? History of arrhythmia/CHF -Patient's home meds include also include Cardizem, Coreg and Lasix.  Continue same post discharge.  Follow-up with cardiology as an outpatient.  Restless leg syndrome with hemibody parkinsonism -Continue ropinirole and primidone -Patient is  followed by neurologist Renee Stephens  Depression - Continue Prozac   Allergies as of 07/30/2021       Reactions   Penicillin G Swelling, Rash   Did it involve swelling of the face/tongue/throat, SOB, or low BP? Yes Did it involve sudden or severe rash/hives, skin peeling, or any reaction on the inside of your mouth or nose? No Did you need to seek medical attention at a hospital or doctor's office? Yes When did it last happen?      childhood allergy If all above answers are "NO", may proceed with cephalosporin use.        Medication List     STOP taking these medications    metoprolol succinate 25 MG 24 hr tablet Commonly known as: TOPROL-XL       TAKE these medications    acetaminophen 500 MG tablet Commonly known as: TYLENOL Take 500 mg by mouth every 8 (eight) hours as needed for moderate pain or headache.   aspirin EC 81 MG tablet Take 1 tablet (81 mg total) by mouth daily.   carvedilol 3.125 MG tablet Commonly known as: COREG Take 3.125 mg by mouth 2 (two) times daily.   clopidogrel 75 MG tablet Commonly known as: PLAVIX Take 75 mg by mouth daily.   cyclobenzaprine 10 MG tablet Commonly known as: FLEXERIL Take 10 mg by mouth at bedtime.   diltiazem 180 MG 24 hr capsule Commonly known as: CARDIZEM CD Take 180 mg by mouth daily.   docusate sodium 100 MG capsule Commonly known as: COLACE Take 100 mg by mouth daily as needed for mild constipation.   FLUoxetine 40 MG capsule Commonly known  as: PROZAC Take 40 mg by mouth daily.   gabapentin 100 MG capsule Commonly known as: NEURONTIN Take 100 mg by mouth daily at 6 (six) AM.   isosorbide mononitrate 30 MG 24 hr tablet Commonly known as: IMDUR Take 30 mg by mouth daily.   omeprazole 20 MG capsule Commonly known as: PRILOSEC Take 20 mg by mouth daily.   primidone 50 MG tablet Commonly known as: MYSOLINE Take 50 mg by mouth daily.   rOPINIRole 1 MG tablet Commonly known as: REQUIP Take 2 mg by  mouth See admin instructions. Take 2 mg in the evening and 2 mg at bedtime   rOPINIRole 4 MG 24 hr tablet Commonly known as: REQUIP XL Take 4 mg by mouth at bedtime.   rosuvastatin 40 MG tablet Commonly known as: CRESTOR Take 1 tablet (40 mg total) by mouth daily at 6 PM.   zonisamide 100 MG capsule Commonly known as: ZONEGRAN Take 100 mg by mouth 2 (two) times daily.        Discharge Instructions:  Diet Recommendation:  Discharge Diet Orders (From admission, onward)     Start     Ordered   07/30/21 0000  Diet - low sodium heart healthy        07/30/21 1640              @BRDDSCINSTRUCTIONS @  Follow ups:    Follow-up Information     Stephens, Renee Douglas, MD Follow up.   Specialty: Internal Medicine Contact information: [place] [address] [phone]         Renee Kida, MD Follow up.   Specialties: Cardiology, Internal Medicine Contact information: [place] [address] [phone]                 Wound care:     Discharge Exam:   Vitals:   07/30/21 1140 07/30/21 1350 07/30/21 1545 07/30/21 1600  BP: 115/63 128/70 112/67 (!) 109/92  Pulse: 81 64 77 78  Resp:  20 20 18   Temp: 97.8 F (36.6 C) 98.4 F (36.9 C)    TempSrc: Oral Oral    SpO2: 99% 99% 99% 100%  Weight:  78.6 kg    Height:  5\' 6"  (1.676 m)      Body mass index is 27.97 kg/m.  General exam: Pleasant, middle-aged Caucasian female. Skin: No rashes, lesions or ulcers. HEENT: Atraumatic, normocephalic, no obvious bleeding Lungs: Clear to auscultation bilaterally CVS: Regular rate and rhythm, no murmur GI/Abd soft, nontender, nondistended, bowel sound present CNS: Alert, awake, oriented x3 Psychiatry: Mood appropriate Extremities: No pedal edema, no calf tenderness  Time coordinating discharge: 35 minutes   The results of significant diagnostics from this hospitalization (including imaging,  microbiology, ancillary and laboratory) are listed below for reference.    Procedures and Diagnostic Studies:   DG Chest 2 View  Result Date: 07/29/2021 CLINICAL DATA:  Chest pain, intermittent left-sided chest pain radiating to left arm and back EXAM: CHEST - 2 VIEW COMPARISON:  CT a chest 07/09/2020 FINDINGS: The cardiomediastinal silhouette is normal. There is no focal consolidation or pulmonary edema. There is no pleural effusion or pneumothorax. There is no acute osseous abnormality. IMPRESSION: No radiographic evidence of acute cardiopulmonary process. Electronically Signed   By: Valetta Mole M.D.   On: 07/29/2021 16:35   CARDIAC CATHETERIZATION  Result Date: 07/30/2021   Mid RCA lesion is 30% stenosed.   Ost 1st Diag lesion is  50% stenosed.   Non-stenotic Dist LM to Prox LAD lesion was previously treated.   The left ventricular systolic function is normal.   LV end diastolic pressure is mildly elevated.   The left ventricular ejection fraction is 55-65% by visual estimate.   There is no aortic valve stenosis. Conclusion: Nonobstructive coronary artery disease including patent stent in the proximal LAD, and mild disease in the diagonal branches. Mildly elevated left ventricular end-diastolic pressure Normal left ventricular systolic function Recommendation Continue aspirin and Plavix as previously prescribed Will give Lasix 40 mg p.o. for elevated LVEDP Ongoing aggressive secondary prevention of coronary artery disease Follow-up with primary cardiologist in 1 to 2 weeks     Labs:   Basic Metabolic Panel: Recent Labs  Lab 07/29/21 1550  NA 137  K 3.8  CL 106  CO2 25  GLUCOSE 92  BUN 15  CREATININE 0.89  CALCIUM 9.8   GFR Estimated Creatinine Clearance: 67.5 mL/min (by C-G formula based on SCr of 0.89 mg/dL). Liver Function Tests: No results for input(s): AST, ALT, ALKPHOS, BILITOT, PROT, ALBUMIN in the last 168 hours. No results for input(s): LIPASE, AMYLASE in the last 168  hours. No results for input(s): AMMONIA in the last 168 hours. Coagulation profile Recent Labs  Lab 07/30/21 0607  INR 1.1    CBC: Recent Labs  Lab 07/29/21 1550  WBC 5.0  HGB 13.4  HCT 41.8  MCV 86.7  PLT 282   Cardiac Enzymes: No results for input(s): CKTOTAL, CKMB, CKMBINDEX, TROPONINI in the last 168 hours. BNP: Invalid input(s): POCBNP CBG: No results for input(s): GLUCAP in the last 168 hours. D-Dimer No results for input(s): DDIMER in the last 72 hours. Hgb A1c No results for input(s): HGBA1C in the last 72 hours. Lipid Profile No results for input(s): CHOL, HDL, LDLCALC, TRIG, CHOLHDL, LDLDIRECT in the last 72 hours. Thyroid function studies No results for input(s): TSH, T4TOTAL, T3FREE, THYROIDAB in the last 72 hours.  Invalid input(s): FREET3 Anemia work up No results for input(s): VITAMINB12, FOLATE, FERRITIN, TIBC, IRON, RETICCTPCT in the last 72 hours. Microbiology Recent Results (from the past 240 hour(s))  Resp Panel by RT-PCR (Flu A&B, Covid) Nasopharyngeal Swab     Status: None   Collection Time: 07/29/21 10:46 PM   Specimen: Nasopharyngeal Swab; Nasopharyngeal(NP) swabs in vial transport medium  Result Value Ref Range Status   SARS Coronavirus 2 by RT PCR NEGATIVE NEGATIVE Final    Comment: (NOTE) SARS-CoV-2 target nucleic acids are NOT DETECTED.  The SARS-CoV-2 RNA is generally detectable in upper respiratory specimens during the acute phase of infection. The lowest concentration of SARS-CoV-2 viral copies this assay can detect is 138 copies/mL. A negative result does not preclude SARS-Cov-2 infection and should not be used as the sole basis for treatment or other patient management decisions. A negative result may occur with  improper specimen collection/handling, submission of specimen other than nasopharyngeal swab, presence of viral mutation(s) within the areas targeted by this assay, and inadequate number of viral copies(<138 copies/mL).  A negative result must be combined with clinical observations, patient history, and epidemiological information. The expected result is Negative.  Fact Sheet for Patients:  EntrepreneurPulse.com.au  Fact Sheet for Healthcare Providers:  IncredibleEmployment.be  This test is no t yet approved or cleared by the [place] FDA and  has been authorized for detection and/or diagnosis of SARS-CoV-2 by FDA under an Emergency Use Authorization (EUA). This EUA will remain  in effect (meaning this test can  be used) for the duration of the COVID-19 declaration under Section 564(b)(1) of the Act, 21 U.S.C.section 360bbb-3(b)(1), unless the authorization is terminated  or revoked sooner.       Influenza A by PCR NEGATIVE NEGATIVE Final   Influenza B by PCR NEGATIVE NEGATIVE Final    Comment: (NOTE) The Xpert Xpress SARS-CoV-2/FLU/RSV plus assay is intended as an aid in the diagnosis of influenza from Nasopharyngeal swab specimens and should not be used as a sole basis for treatment. Nasal washings and aspirates are unacceptable for Xpert Xpress SARS-CoV-2/FLU/RSV testing.  Fact Sheet for Patients: EntrepreneurPulse.com.au  Fact Sheet for Healthcare Providers: IncredibleEmployment.be  This test is not yet approved or cleared by the [place] FDA and has been authorized for detection and/or diagnosis of SARS-CoV-2 by FDA under an Emergency Use Authorization (EUA). This EUA will remain in effect (meaning this test can be used) for the duration of the COVID-19 declaration under Section 564(b)(1) of the Act, 21 U.S.C. section 360bbb-3(b)(1), unless the authorization is terminated or revoked.  Performed at [facility], [address] [place], [place] [address]      Signed: Terrilee Croak  [facility] 07/30/2021, 4:40 PM

## 2021-07-30 NOTE — Progress Notes (Signed)
[place] for heparin infusion Indication: ACS/STEMI  Allergies  Allergen Reactions   Penicillin G Swelling and Rash    Did it involve swelling of the face/tongue/throat, SOB, or low BP? Yes Did it involve sudden or severe rash/hives, skin peeling, or any reaction on the inside of your mouth or nose? No Did you need to seek medical attention at a hospital or doctor's office? Yes When did it last happen?      childhood allergy If all above answers are "NO", may proceed with cephalosporin use.     Patient Measurements: Height: 5\' 6"  (167.6 cm) Weight: 78.6 kg (173 lb 3.2 oz) IBW/kg (Calculated) : 59.3 Heparin Dosing Weight: 75.5 kg  Vital Signs: Temp: 97.8 F (36.6 C) (11/30 1140) Temp Source: Oral (11/30 1140) BP: 115/63 (11/30 1140) Pulse Rate: 81 (11/30 1140)  Labs: Recent Labs    07/29/21 1550 07/29/21 1754 07/30/21 0607 07/30/21 1149  HGB 13.4  --   --   --   HCT 41.8  --   --   --   PLT 282  --   --   --   APTT  --   --  162*  --   LABPROT  --   --  14.2  --   INR  --   --  1.1  --   HEPARINUNFRC  --   --   --  >1.10*  CREATININE 0.89  --   --   --   TROPONINIHS 4 4  --   --      Estimated Creatinine Clearance: 67.5 mL/min (by C-G formula based on SCr of 0.89 mg/dL).   Medical History: Past Medical History:  Diagnosis Date   Anxiety    Coronary artery disease    Myocardial infarction [facility])     Assessment: Pt is 65 yo female w/ hx of MI s/p LAD stent presenting to ED c/o intermittent CP x 4 wks, getting more intense and frequent, being started on heparin pending cardiology consult to evaluate for cath procedure.  11/30 11:49 HL > 1.1   Goal of Therapy:  Heparin level 0.3-0.7 units/ml Monitor platelets by anticoagulation protocol: Yes   Plan:  Heparin level was supratherapeutic. Will decrease heparin infusion to 800 units/hr. Recheck heparin level in 6 hours. CBC daily while on heparin.    Eleonore Chiquito,  PharmD, BCPS  07/30/2021 12:46 PM

## 2021-07-30 NOTE — Plan of Care (Signed)
  Problem: Education: Goal: Knowledge of General Education information will improve Description: Including pain rating scale, medication(s)/side effects and non-pharmacologic comfort measures Outcome: Progressing   Problem: Activity: Goal: Risk for activity intolerance will decrease Outcome: Progressing   Problem: Elimination: Goal: Will not experience complications related to bowel motility Outcome: Progressing   Problem: Elimination: Goal: Will not experience complications related to urinary retention Outcome: Progressing   Problem: Pain Managment: Goal: General experience of comfort will improve Outcome: Progressing

## 2021-07-30 NOTE — Progress Notes (Signed)
PROGRESS NOTE  Renee Stephens  DOB: 1955-10-10  PCP: Idelle Crouch, MD YBW:[id number]  DOA: 07/29/2021  LOS: 0 days  Hospital Day: 2  Chief Complaint  Patient presents with   Chest Pain    Brief narrative: Renee Stephens is a 66 y.o. 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  Patient was sent to the ED on 11/29 by his cardiologist Dr. Clayborn Bigness for evaluation of chest pain which she had on and off for last 4 weeks, unrelated to exertion.   In the ED, patient was hemodynamically stable. Troponin not significantly elevated.  EKG showed normal sinus rhythm with RBBB, nonspecific ST-T wave changes. Because of her significant history of coronary disease requiring stent and ongoing chest pain, a decision was made to undergo cardiac cath.    Subjective: Patient was seen and examined this morning.  Pleasant middle-aged Caucasian female.  Lying down in bed.  Not in distress.  No new symptoms.  Waiting for cardiac cath  Assessment/Plan: Unstable angina History of CAD/MI s/p stents -Cardiac consult appreciated.  Heparin drip initiated. -Pending cardiac cath today. -Continue aspirin, Plavix, metoprolol, Imdur and Crestor  ?  History of arrhythmia/CHF -patient also on Cardizem and metoprolol.  Continue to monitor. -I will keep Lasix on hold prior to cath  Restless leg syndrome with hemibody parkinsonism -Continue ropinirole and primidone -Patient is followed by neurologist Dr. Manuella Ghazi  Depression - Continue Prozac  Mobility: Encourage ambulation Living condition: None Goals of care:   Code Status: Prior full code Nutritional status: Body mass index is 27.97 kg/m.     Diet: Can start on diet postprocedure today Diet Order             Diet NPO time specified  Diet effective now                  DVT  prophylaxis: Heparin infusion   Antimicrobials: None Fluid: None Consultants: Cardiology Family Communication: None at bedside  Status is: Observation  Remains inpatient appropriate because: Underwent cardiac cath this afternoon  Dispo: The patient is from: Home              Anticipated d/c is to: Home in 1 to 2 days              Patient currently is not medically stable to d/c.   Difficult to place patient No     Infusions:   sodium chloride     sodium chloride     heparin Stopped (07/30/21 1320)    Scheduled Meds:  [MAR Hold] aspirin EC  81 mg Oral Daily   [MAR Hold] clopidogrel  75 mg Oral Daily   [MAR Hold] diltiazem  180 mg Oral Daily   [MAR Hold] FLUoxetine  20 mg Oral Daily   [MAR Hold] isosorbide mononitrate  30 mg Oral Daily   [MAR Hold] metoprolol succinate  25 mg Oral Daily   [MAR Hold] pantoprazole  40 mg Oral Daily   [MAR Hold] primidone  50 mg Oral Daily   [MAR Hold] rOPINIRole  2 mg Oral See admin instructions   [MAR Hold] rosuvastatin  40 mg Oral q1800   [MAR Hold] sodium chloride flush  3 mL Intravenous Q12H    PRN meds: sodium chloride, [MAR Hold] acetaminophen, [MAR Hold] docusate sodium, sodium chloride flush   Antimicrobials: Anti-infectives (From  admission, onward)    None       Objective: Vitals:   07/30/21 1140 07/30/21 1350  BP: 115/63 128/70  Pulse: 81 64  Resp:  20  Temp: 97.8 F (36.6 C) 98.4 F (36.9 C)  SpO2: 99% 99%    Intake/Output Summary (Last 24 hours) at 07/30/2021 1458 Last data filed at 07/30/2021 0802 Gross per 24 hour  Intake 10 ml  Output 100 ml  Net -90 ml   Filed Weights   07/30/21 0333 07/30/21 1139 07/30/21 1350  Weight: 78.7 kg 78.6 kg 78.6 kg   Weight change:  Body mass index is 27.97 kg/m.   Physical Exam: General exam: Pleasant, middle-aged Caucasian female. Skin: No rashes, lesions or ulcers. HEENT: Atraumatic, normocephalic, no obvious bleeding Lungs: Clear to auscultation  bilaterally CVS: Regular rate and rhythm, no murmur GI/Abd soft, nontender, nondistended, bowel sound present CNS: Alert, awake, oriented x3 Psychiatry: Mood appropriate Extremities: No pedal edema, no calf tenderness  Data Review: I have personally reviewed the laboratory data and studies available.  F/u labs ordered Unresulted Labs (From admission, onward)     Start     Ordered   07/31/21 0500  CBC  Tomorrow morning,   R        07/30/21 0732   07/30/21 2000  Heparin level (unfractionated)  Once-Timed,   TIMED        07/30/21 1322   Unscheduled  Basic metabolic panel  Daily,   R      07/30/21 1458            Signed, Terrilee Croak, MD [facility] 07/30/2021

## 2021-07-30 NOTE — Progress Notes (Signed)
Dr. Corky Sox stopped by to see pt. : ok'd pt. For DC home this evening.

## 2021-07-30 NOTE — Progress Notes (Signed)
Renee Stephens to be D/C'd home per MD order. Discussed with the patient and all questions fully answered.  Skin clean, dry and intact without evidence of skin break down, no evidence of skin tears noted. Dressing intact to right wrist and patient instructed on aftercare. IV catheters discontinued intact. Site without signs and symptoms of complications. Dressing and pressure applied.  An After Visit Summary was printed and given to the patient.  Patient escorted via [place], and D/C home via private auto.  [name]  07/30/2021 6:53 PM

## 2021-07-30 NOTE — Consult Note (Signed)
[facility]  CARDIOLOGY CONSULT NOTE  Patient ID: Renee Stephens MRN: [id number] DOB/AGE: 05-Jan-1956 65 y.o.  Admit date: 07/29/2021 Referring Physician Judd Gaudier Primary Physician Doy Hutching, Leonie [facility], MD Primary Cardiologist [facility] Reason for Consultation chest pain  HPI:  Renee Stephens is a 65 year old female with history of CAD (NSTEMI s/p LAD DES 10/2019), restless leg syndrome, anxiety who presents to the emergency department for evaluation of chest pain.  She was seen by her cardiologist (Dr. Clayborn Bigness) on 07/29/2021 with complaints of 4 weeks of intermittent chest pain, worse than her baseline.  At baseline her chest pain she would have pain 4-5 times a week, central, associated with SOB and radiating down her left arm; occurs randomly. She states that these episodes were unrelated to exertion and would last for few minutes at a time.  The severity was 8 out of 10 and she would have 10-20 episodes per day.  They seem to be getting more intense and more frequent, with more minutes in the day with pain compared to without.  She had no other associated nausea vomiting or diuresis. She has associated SOB. Based on her ongoing symptoms and ongoing chest pain at the time of her evaluation in clinic she was referred to the emergency department for further evaluation.  Arrival to the emergency department her vital signs were stable.  Her high-sensitivity troponin was 4 on 2 repeats.  Her EKG completed in the emergency department showed normal sinus rhythm at a rate of 98 with a right bundle branch block.  There were some lateral ST and T wave abnormalities.  Review of systems complete and found to be negative unless listed above     Past Medical History:  Diagnosis Date   Anxiety    Coronary artery disease    Myocardial infarction [facility])     Past Surgical History:  Procedure Laterality Date   ABDOMINAL HYSTERECTOMY  1995   CARDIAC CATHETERIZATION     COLONOSCOPY N/A  06/17/2020   Procedure: COLONOSCOPY;  Surgeon: Toledo, Benay Pike, MD;  Location: [facility] ENDOSCOPY;  Service: Gastroenterology;  Laterality: N/A;   CORONARY STENT INTERVENTION N/A 12/28/2018   Procedure: CORONARY STENT INTERVENTION;  Surgeon: Yolonda Kida, MD;  Location: [facility];  Service: Cardiovascular;  Laterality: N/A;   LEFT HEART CATH AND CORONARY ANGIOGRAPHY Left 12/28/2018   Procedure: LEFT HEART CATH AND CORONARY ANGIOGRAPHY;  Surgeon: Yolonda Kida, MD;  Location: [place] [facility];  Service: Cardiovascular;  Laterality: Left;   LEFT HEART CATH AND CORONARY ANGIOGRAPHY N/A 11/01/2019   Procedure: LEFT HEART CATH AND CORONARY ANGIOGRAPHY;  Surgeon: Yolonda Kida, MD;  Location: [place] [facility];  Service: Cardiovascular;  Laterality: N/A;    Medications Prior to Admission  Medication Sig Dispense Refill Last Dose   acetaminophen (TYLENOL) 500 MG tablet Take 500 mg by mouth every 8 (eight) hours as needed for moderate pain or headache.      aspirin EC 81 MG tablet Take 81 mg by mouth daily.      clopidogrel (PLAVIX) 75 MG tablet Take 75 mg by mouth daily.      diltiazem (CARDIZEM CD) 180 MG 24 hr capsule Take 180 mg by mouth daily.      docusate sodium (COLACE) 100 MG capsule Take 100 mg by mouth daily as needed for mild constipation.      FLUoxetine (PROZAC) 20 MG capsule Take 20 mg by mouth daily.       furosemide (LASIX)  20 MG tablet Take 20 mg by mouth daily as needed for edema.      isosorbide mononitrate (IMDUR) 30 MG 24 hr tablet Take 30 mg by mouth daily.      metoprolol succinate (TOPROL-XL) 25 MG 24 hr tablet Take 25 mg by mouth daily.      omeprazole (PRILOSEC) 20 MG capsule Take 20 mg by mouth daily.      primidone (MYSOLINE) 50 MG tablet Take 50 mg by mouth daily.      rOPINIRole (REQUIP) 1 MG tablet Take 2 mg by mouth See admin instructions. Take 2 mg in the evening and 2 mg at bedtime      rosuvastatin (CRESTOR) 40 MG tablet Take 1 tablet (40  mg total) by mouth daily at 6 PM. 30 tablet 12    ticagrelor (BRILINTA) 90 MG TABS tablet Take 1 tablet (90 mg total) by mouth 2 (two) times daily. (Patient not taking: Reported on 10/25/2019) 60 tablet 12    Social History   Socioeconomic History   Marital status: Married    Spouse name: Not on file   Number of children: Not on file   Years of education: Not on file   Highest education level: Not on file  Occupational History   Occupation: [employer]  Tobacco Use   Smoking status: Never   Smokeless tobacco: Never  Vaping Use   Vaping Use: Never used  Substance and Sexual Activity   Alcohol use: No   Drug use: No   Sexual activity: Yes  Other Topics Concern   Not on file  Social History Narrative   Not on file   Social Determinants of Health   Financial Resource Strain: Not on file  Food Insecurity: Not on file  Transportation Needs: Not on file  Physical Activity: Not on file  Stress: Not on file  Social Connections: Not on file  Intimate Partner Violence: Not on file    Family History  Problem Relation Age of Onset   Kidney failure Father    Kidney failure Mother    Breast cancer Neg Hx    Bladder Cancer Neg Hx       Review of systems complete and found to be negative unless listed above      PHYSICAL EXAM  General: Well developed, well nourished, in no acute distress HEENT:  Normocephalic and atramatic Neck:  No JVD.  Lungs: Clear bilaterally to auscultation and percussion. Heart: HRRR . Normal S1 and S2 without gallops or murmurs.  Abdomen: Bowel sounds are positive, abdomen soft and non-tender  Msk:  Back normal, normal gait. Normal strength and tone for age. Extremities: No clubbing, cyanosis or edema.   Neuro: Alert and oriented X 3. Psych:  Good affect, responds appropriately  Labs:   Lab Results  Component Value Date   WBC 5.0 07/29/2021   HGB 13.4 07/29/2021   HCT 41.8 07/29/2021   MCV 86.7 07/29/2021   PLT 282 07/29/2021    Recent Labs   Lab 07/29/21 1550  NA 137  K 3.8  CL 106  CO2 25  BUN 15  CREATININE 0.89  CALCIUM 9.8  GLUCOSE 92   No results found for: CKTOTAL, CKMB, CKMBINDEX, TROPONINI No results found for: CHOL No results found for: HDL No results found for: LDLCALC No results found for: TRIG No results found for: CHOLHDL No results found for: LDLDIRECT    Radiology: DG Chest 2 View  Result Date: 07/29/2021 CLINICAL DATA:  Chest pain, intermittent  left-sided chest pain radiating to left arm and back EXAM: CHEST - 2 VIEW COMPARISON:  CT a chest 07/09/2020 FINDINGS: The cardiomediastinal silhouette is normal. There is no focal consolidation or pulmonary edema. There is no pleural effusion or pneumothorax. There is no acute osseous abnormality. IMPRESSION: No radiographic evidence of acute cardiopulmonary process. Electronically Signed   By: Valetta Mole M.D.   On: 07/29/2021 16:35    EKG: NSR. RBBB. Inferolateral TW and ST abnormality.  ASSESSMENT AND PLAN:  Renee Stephens is a 65 year old female with history of CAD (NSTEMI s/p LAD DES 11/2018), restless leg syndrome, anxiety who presents to the emergency department for evaluation of chest pain.  # Chest pain, concerning for unstable angina Patient endorses 4 weeks of worsening chest pain which is now happening 10 times a day which he describes as a pressure-like sensation radiating down her left arm and associated with shortness of breath.  She has a history of CAD for which she received a drug-eluting stent to her LAD in April 2020.  Her cardiac enzymes are normal.  After a long discussion with the patient, and her hesitancy to have a stress test, we mutually agreed to proceed with left heart catheterization for further evaluation of her symptoms.  The risks and benefits were discussed at length with the patient and her husband and she was agreeable to proceed. -Left heart cath with possible intervention today.  Please keep patient NPO. -Continue aspirin and  Plavix -Continue heparin infusion for now -Continue Crestor 40 mg daily -Continue home metoprolol and Imdur  Signed: Andrez Grime MD 07/30/2021, 7:43 AM

## 2021-07-31 ENCOUNTER — Encounter: Payer: Self-pay | Admitting: Cardiology

## 2021-08-07 ENCOUNTER — Other Ambulatory Visit: Payer: Self-pay | Admitting: Internal Medicine

## 2021-08-07 DIAGNOSIS — Z1231 Encounter for screening mammogram for malignant neoplasm of breast: Secondary | ICD-10-CM

## 2021-08-15 ENCOUNTER — Ambulatory Visit
Admission: RE | Admit: 2021-08-15 | Discharge: 2021-08-15 | Disposition: A | Discharge status: Home / Self Care | Payer: Commercial Managed Care - PPO | Source: Ambulatory Visit | Attending: Internal Medicine | Admitting: Internal Medicine

## 2021-08-15 ENCOUNTER — Other Ambulatory Visit: Payer: Self-pay

## 2021-08-15 DIAGNOSIS — Z1231 Encounter for screening mammogram for malignant neoplasm of breast: Secondary | ICD-10-CM | POA: Diagnosis present

## 2021-08-15 IMAGING — MG MM DIGITAL SCREENING BILAT W/ TOMO AND CAD
8 series · 8 of 24 positions shown · non-contrast
Comparison: Previous exam(s).

CLINICAL DATA: Screening.

EXAM:
DIGITAL SCREENING BILATERAL MAMMOGRAM WITH TOMOSYNTHESIS AND CAD
TECHNIQUE: Bilateral screening digital craniocaudal and mediolateral oblique
mammograms were obtained. Bilateral screening digital breast
tomosynthesis was performed. The images were evaluated with
computer-aided detection.

[L CC synth-2D]
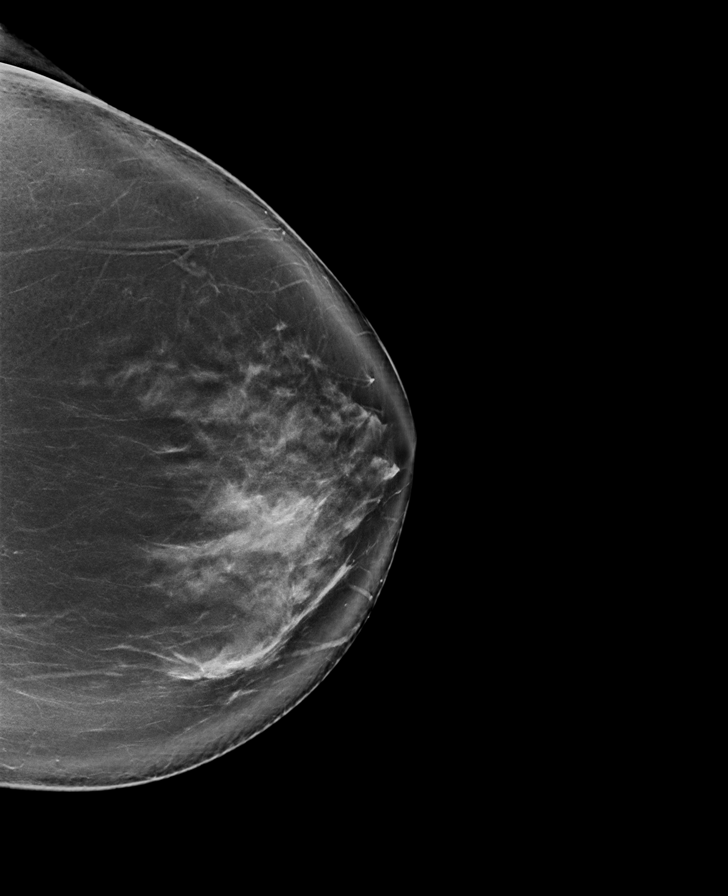

[L MLO synth-2D]
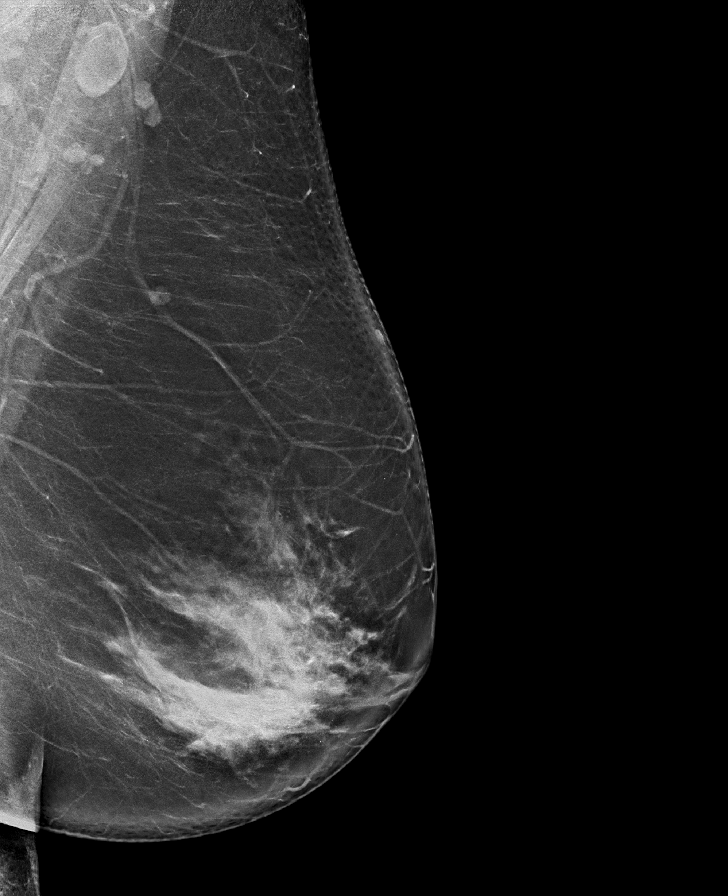

[R MLO synth-2D]
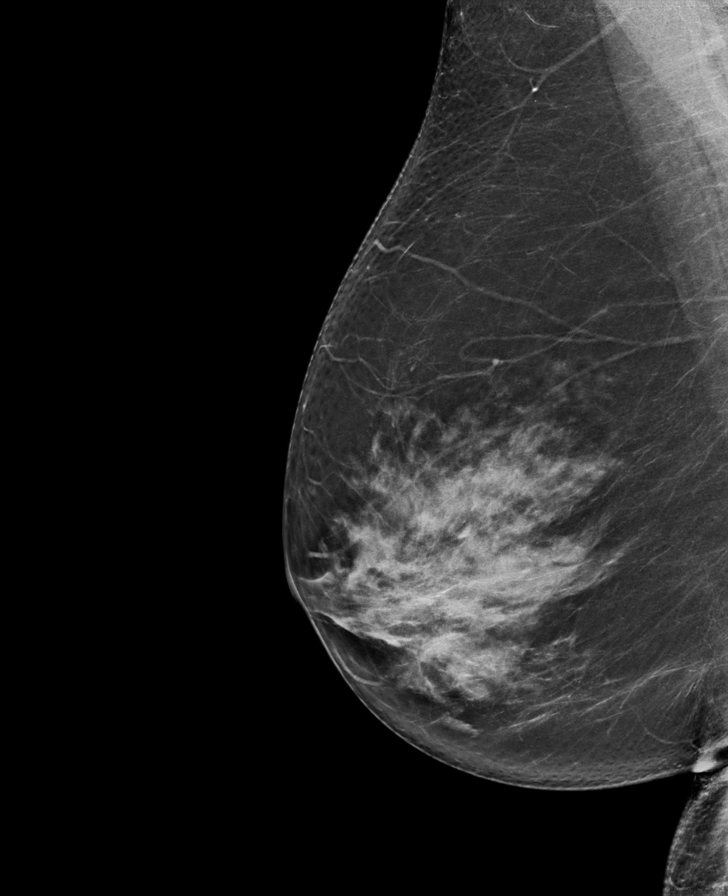

[R CC synth-2D]
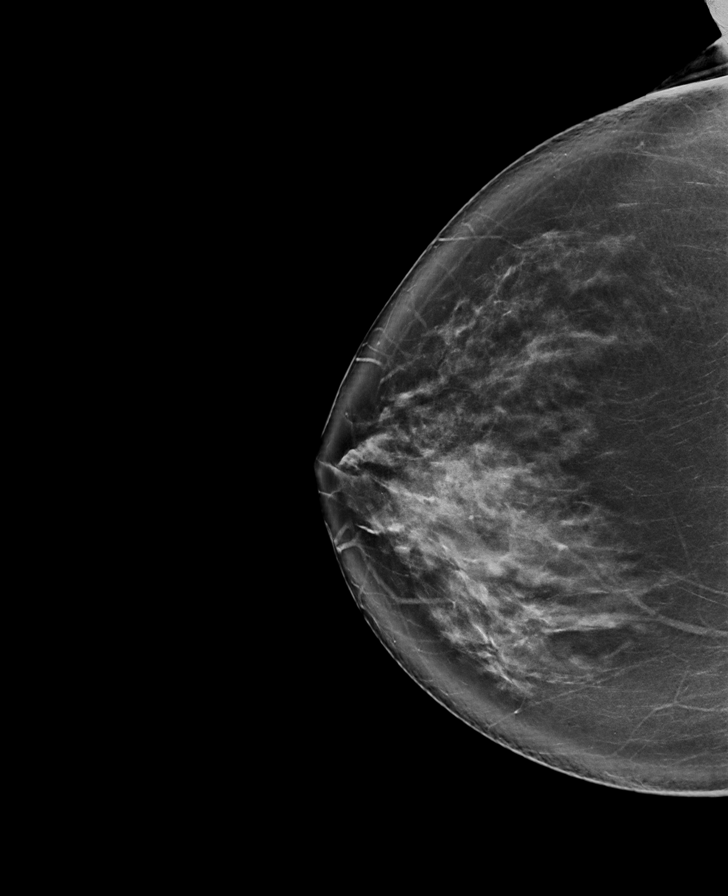

[R MLO tomo · tomo slice 40/79.0]
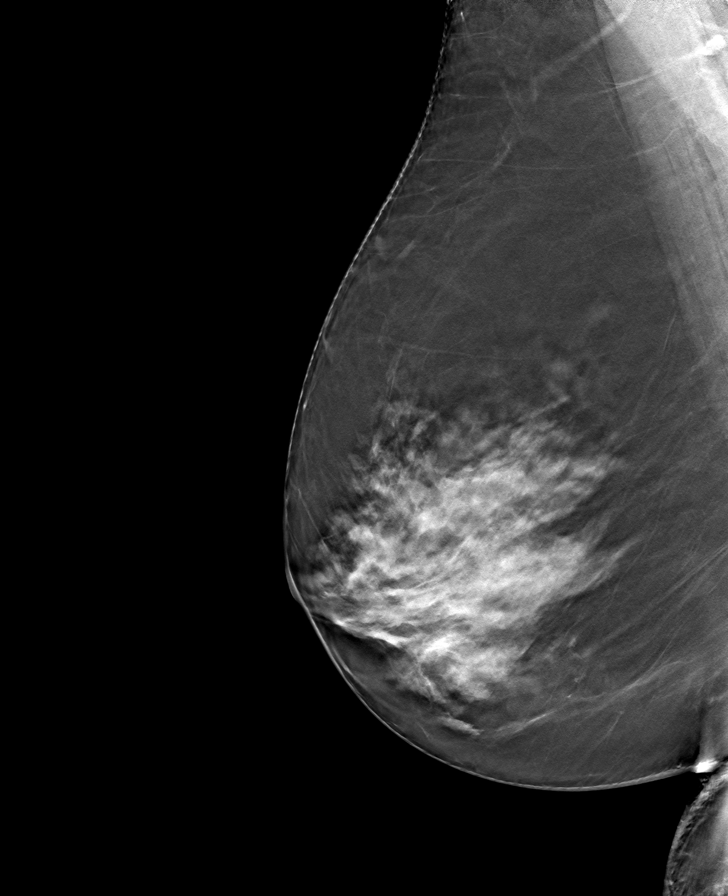

[R CC tomo · tomo slice 49/96.0]
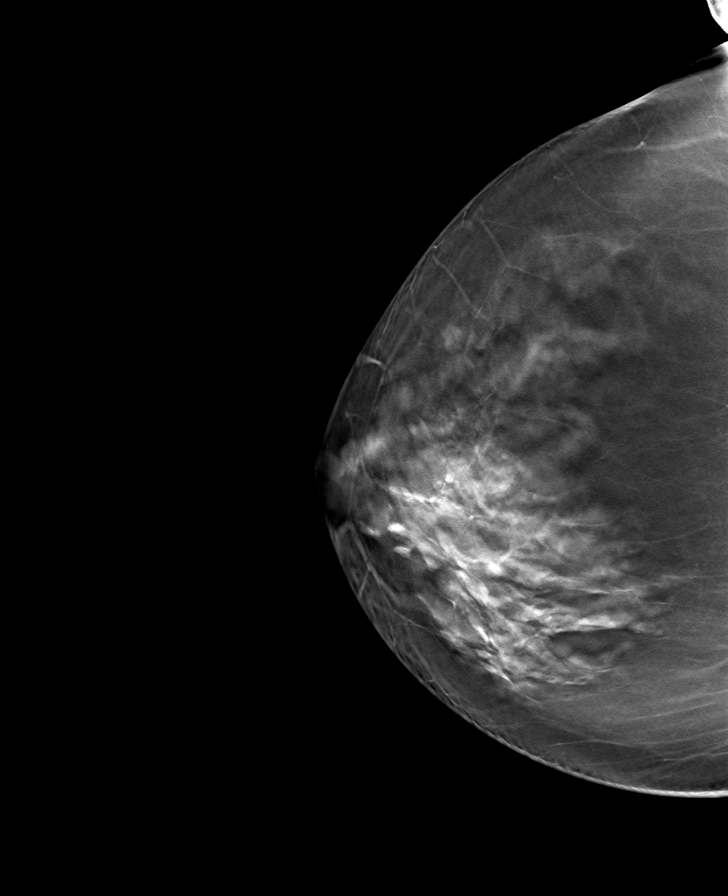

[L CC tomo · tomo slice 51/102.0]
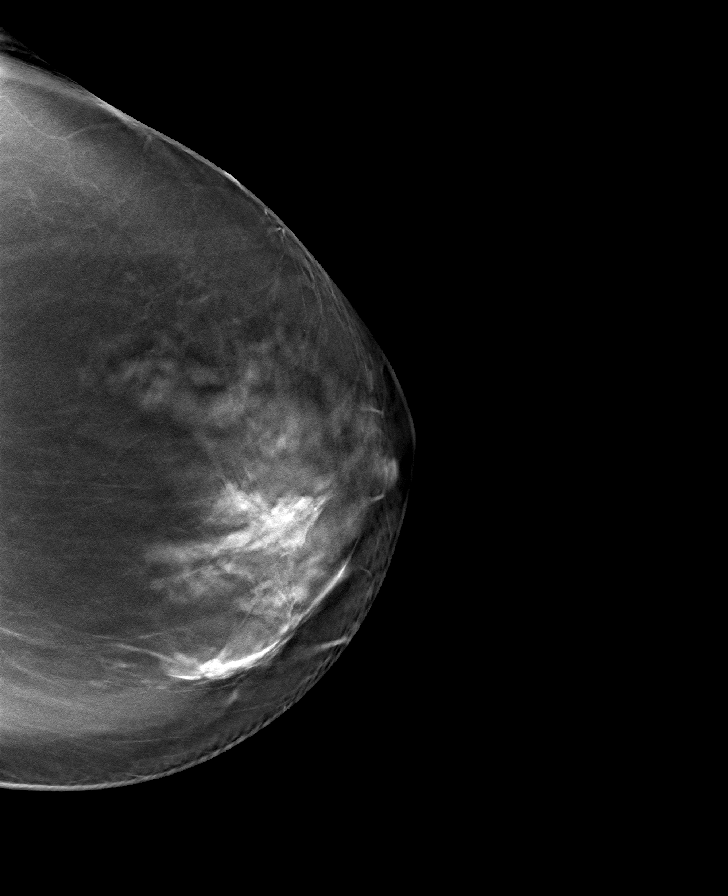

[L MLO tomo · tomo slice 49/97.0]
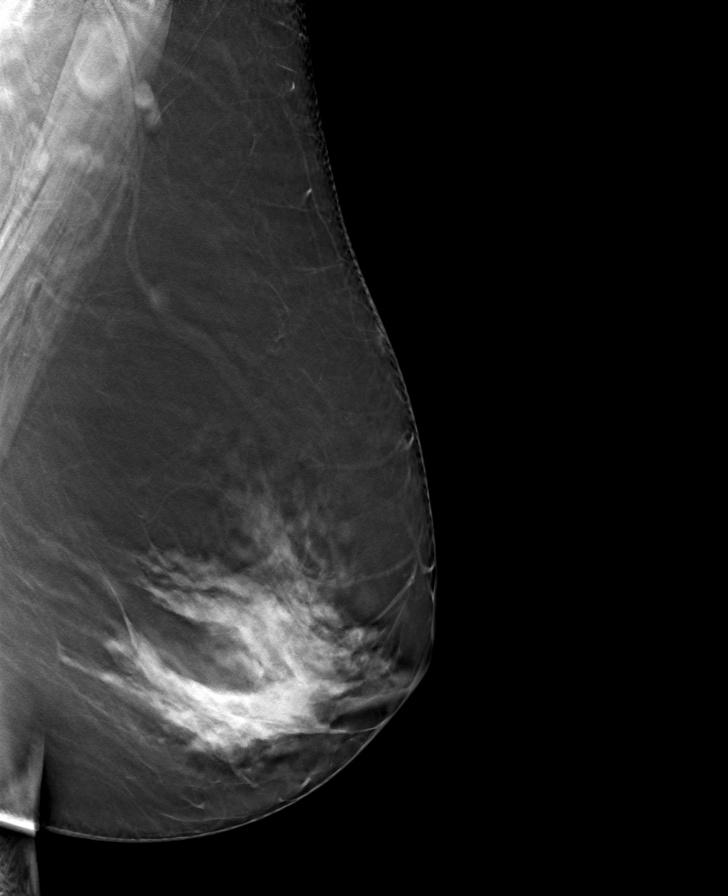

[8 of 24 positions shown; findings below may reference images not displayed]

ACR Breast Density Category c: The breast tissue is heterogeneously
dense, which may obscure small masses.
FINDINGS: There are no findings suspicious for malignancy.
IMPRESSION: No mammographic evidence of malignancy. A result letter of this
screening mammogram will be mailed directly to the patient.

RECOMMENDATION:
Screening mammogram in one year. (Code:[V2])

BI-RADS CATEGORY  1: Negative.

## 2021-08-18 ENCOUNTER — Other Ambulatory Visit (HOSPITAL_COMMUNITY): Payer: Self-pay | Admitting: Student

## 2021-08-18 ENCOUNTER — Other Ambulatory Visit: Payer: Self-pay | Admitting: Student

## 2021-08-18 DIAGNOSIS — G250 Essential tremor: Secondary | ICD-10-CM

## 2021-09-02 ENCOUNTER — Ambulatory Visit
Admission: RE | Admit: 2021-09-02 | Discharge: 2021-09-02 | Disposition: A | Discharge status: Home / Self Care | Payer: Commercial Managed Care - PPO | Source: Ambulatory Visit | Attending: Student | Admitting: Student

## 2021-09-02 DIAGNOSIS — G250 Essential tremor: Secondary | ICD-10-CM | POA: Diagnosis not present

## 2021-09-02 IMAGING — MR MR HEAD W/O CM
11 series · 48 of 48 positions shown · non-contrast
Comparison: None.

CLINICAL DATA: Tremor, essential [20] ([20]-CM)

EXAM:
MRI HEAD WITHOUT CONTRAST
TECHNIQUE: Multiplanar, multiecho pulse sequences of the brain and surrounding
structures were obtained without intravenous contrast.

[Series 5: ax dwi_tracew · axial · 3.0mm · 0.65mm/px · z∈[-107,+48]mm · 4 of 48 slices shown]
[im 1/48]
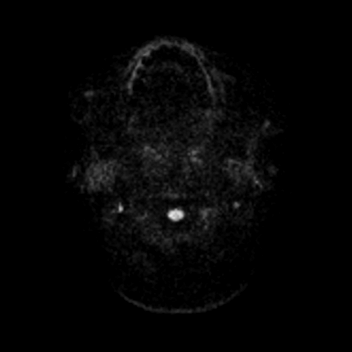
[im 16/48]
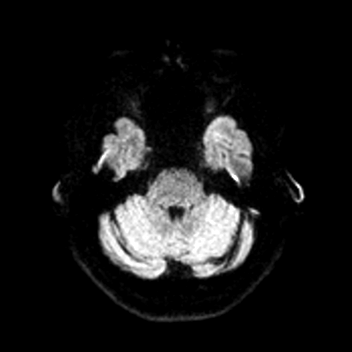
[im 32/48]
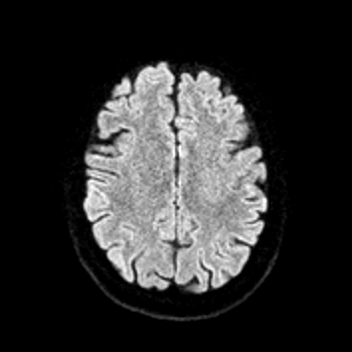
[im 48/48]
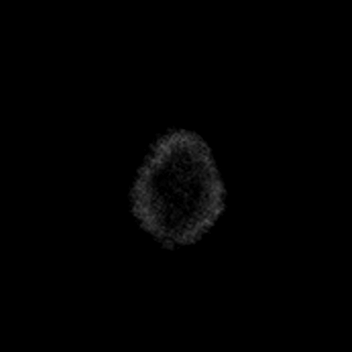

[Series 6: ax dwi_adc · axial · 3.0mm · 0.65mm/px · z∈[-107,+48]mm · 4 of 48 slices shown]
[im 1/48]
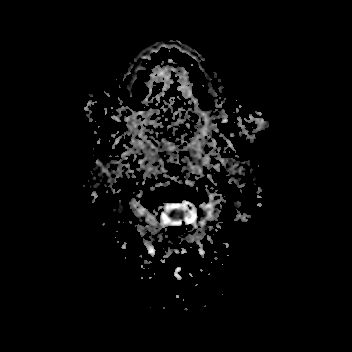
[im 16/48]
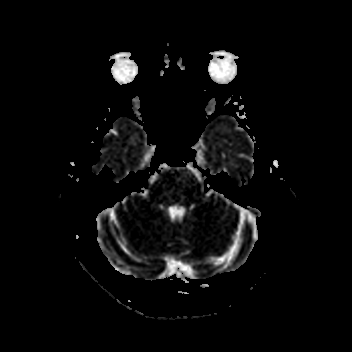
[im 32/48]
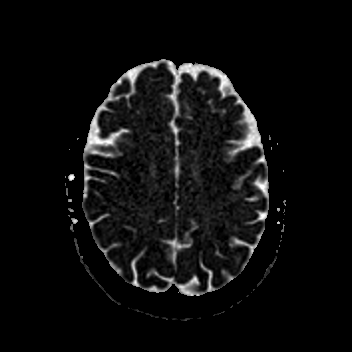
[im 48/48]
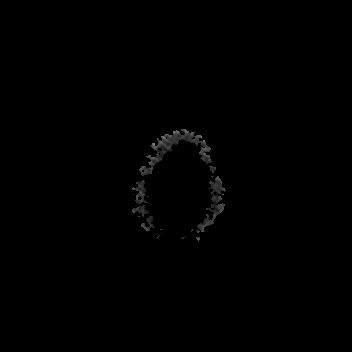

[Series 7: cor dwi_tracew · coronal · 5.0mm · 0.65mm/px · 3 of 36 slices shown]
[im 1/36]
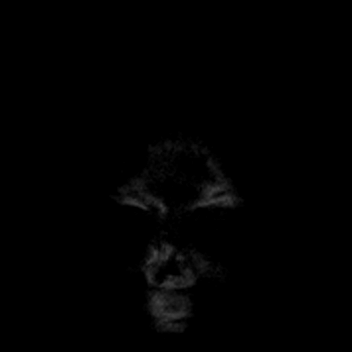
[im 18/36]
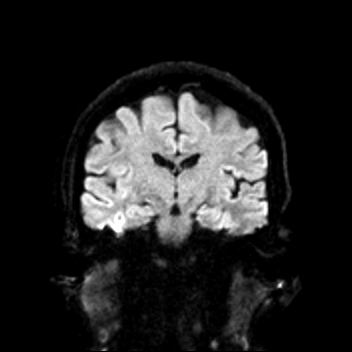
[im 36/36]
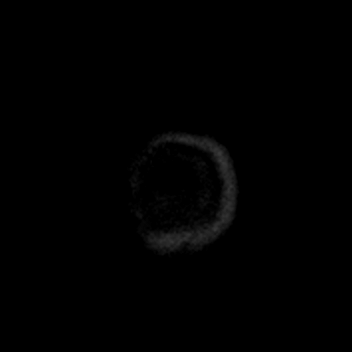

[Series 8: cor dwi_adc · coronal · 5.0mm · 0.65mm/px · 3 of 35 slices shown]
[im 1/35]
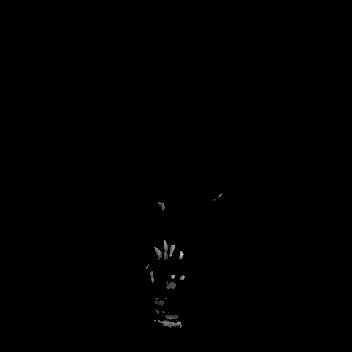
[im 18/35]
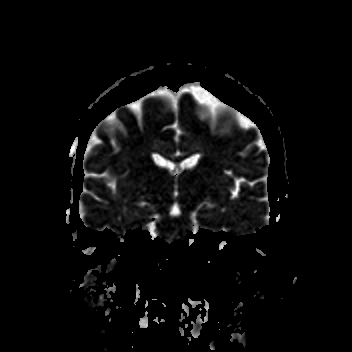
[im 35/35]
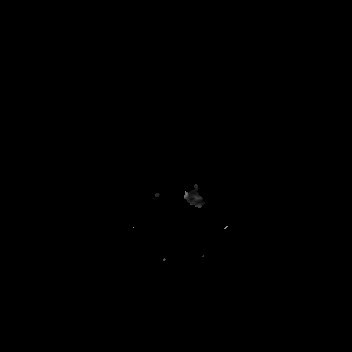

[Series 9: T1 · sagittal · 5.0mm · 0.62mm/px · 2 of 24 slices shown (1 of 2)]
[im 1/24]
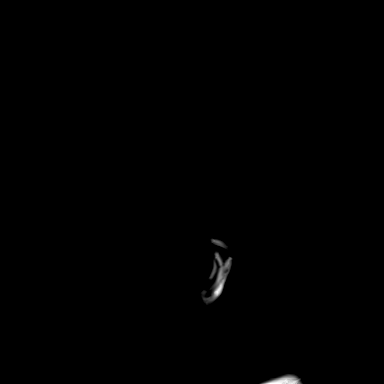
[im 24/24]
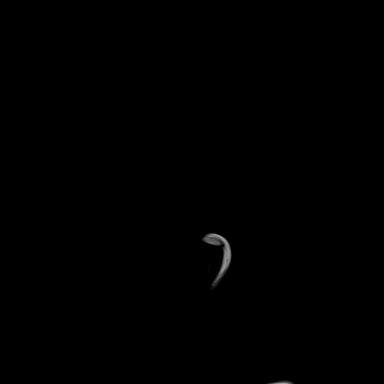

[Series 10: T2 · axial · 5.0mm · 0.53mm/px · z∈[-98,+46]mm · 2 of 25 slices shown (1 of 2)]
[im 1/25]
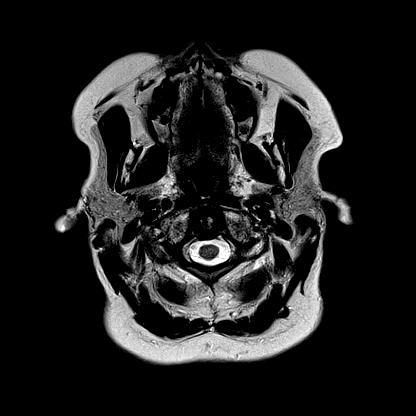
[im 25/25]
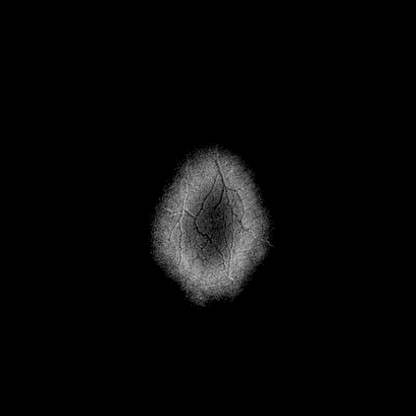

[Series 12: pha_images · axial · 3.0mm · 0.90mm/px · z∈[-115,+62]mm · 5 of 59 slices shown]
[im 1/59]
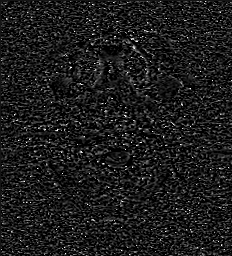
[im 15/59]
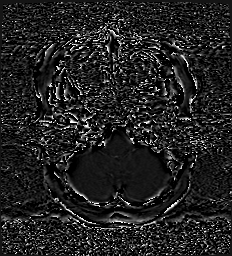
[im 30/59]
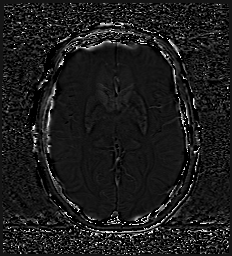
[im 44/59]
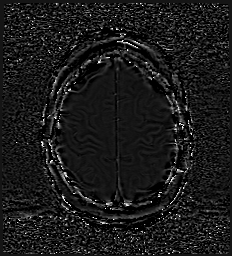
[im 59/59]
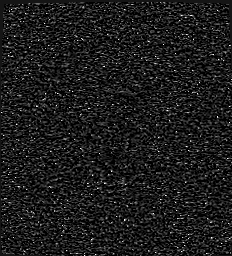

[Series 13: swi_images · axial · 3.0mm · 0.90mm/px · z∈[-115,+62]mm · 5 of 60 slices shown]
[im 1/60]
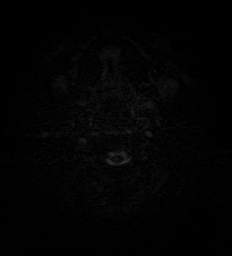
[im 15/60]
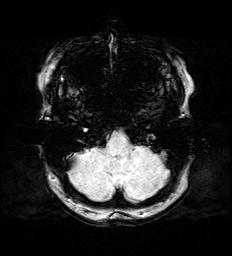
[im 30/60]
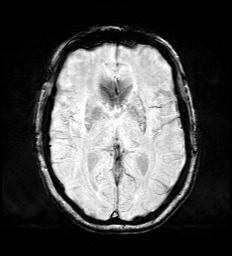
[im 45/60]
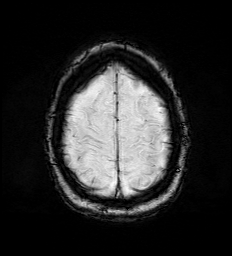
[im 60/60]
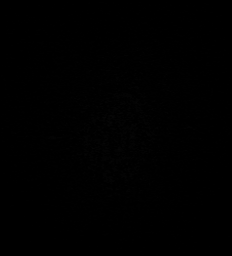

[Series 15: FLAIR · axial · 3.0mm · 0.53mm/px · z∈[-107,+55]mm · 4 of 55 slices shown]
[im 1/55]
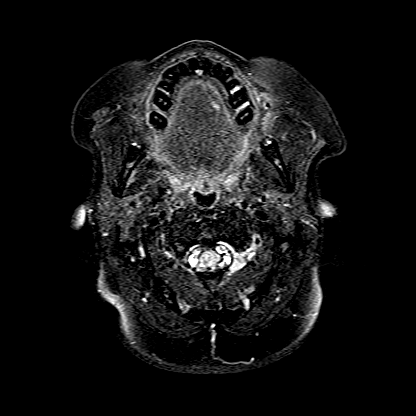
[im 19/55]
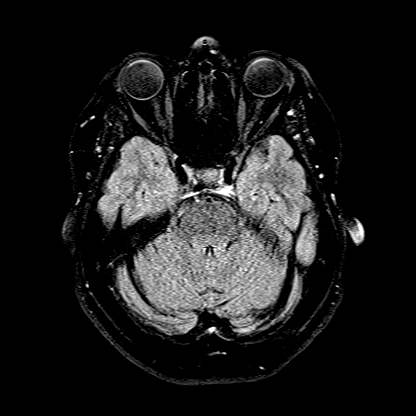
[im 37/55]
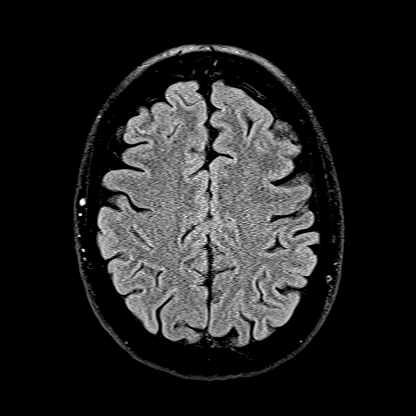
[im 55/55]
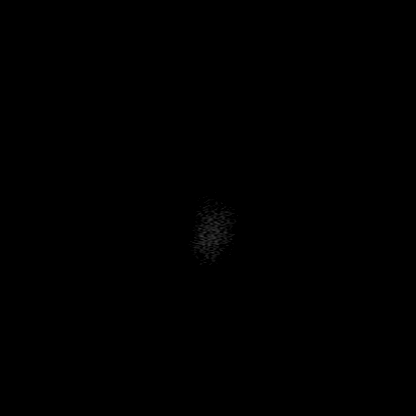

[Series 16: T1 · axial · 1.0mm · 0.98mm/px · z∈[-111,+62]mm · 14 of 174 slices shown (2 of 2)]
[im 1/174]
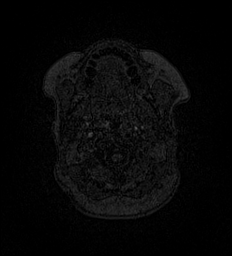
[im 14/174]
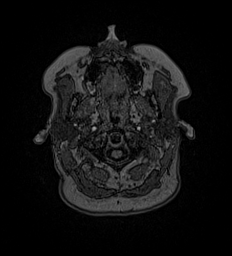
[im 27/174]
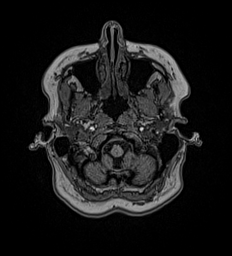
[im 40/174]
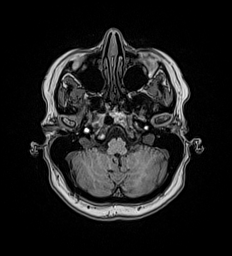
[im 54/174]
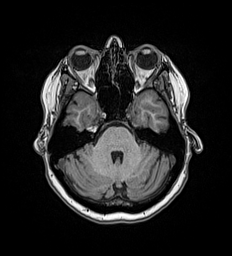
[im 67/174]
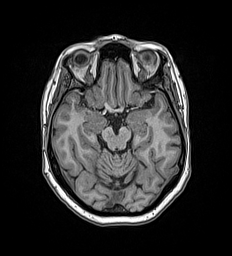
[im 80/174]
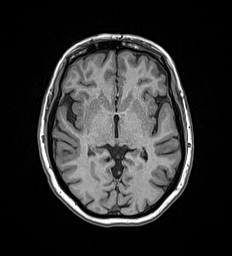
[im 94/174]
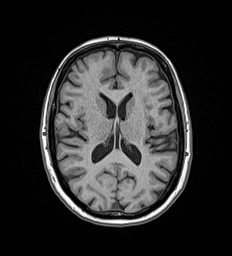
[im 107/174]
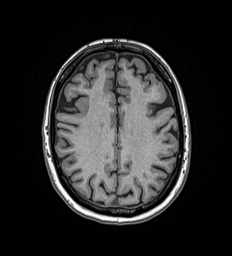
[im 120/174]
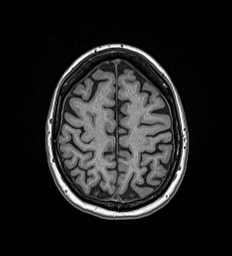
[im 134/174]
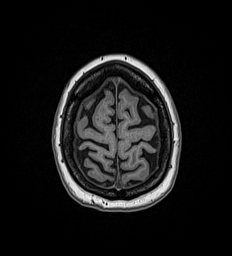
[im 147/174]
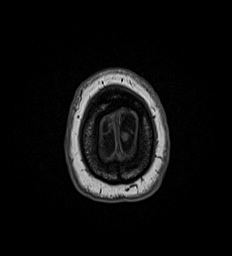
[im 160/174]
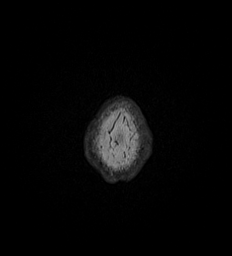
[im 174/174]
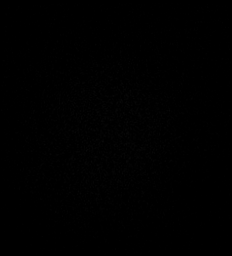

[Series 17: T2 · coronal · 5.0mm · 0.57mm/px · 2 of 29 slices shown (2 of 2)]
[im 1/29]
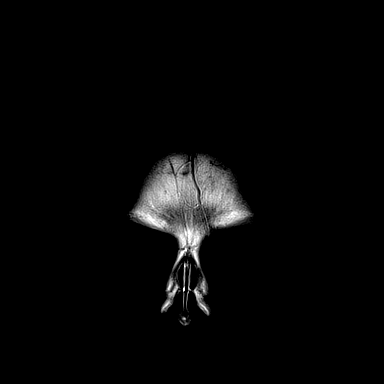
[im 29/29]
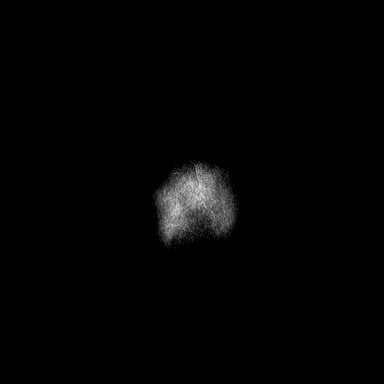

[48 of 48 positions shown; findings below may reference images not displayed]

FINDINGS: Brain: No acute infarction, hemorrhage, hydrocephalus, extra-axial
collection or mass lesion. Tiny remote right cerebellar infarct. A
few scattered T2/FLAIR hyperintensities in the white matter,
nonspecific but considered within normal limits for patient age

Vascular: Major arterial flow voids are maintained at the skull
base.

Skull and upper cervical spine: Normal marrow signal.

Sinuses/Orbits: Clear sinuses.  Unremarkable orbits.

Other: No mastoid effusions.
IMPRESSION: 1. Unremarkable brain MRI.  No evidence of acute abnormality.
2. Tiny remote right cerebellar infarct.

## 2022-06-18 ENCOUNTER — Other Ambulatory Visit: Payer: Self-pay | Admitting: Internal Medicine

## 2022-06-18 DIAGNOSIS — Z1231 Encounter for screening mammogram for malignant neoplasm of breast: Secondary | ICD-10-CM

## 2022-07-14 DIAGNOSIS — S52123A Displaced fracture of head of unspecified radius, initial encounter for closed fracture: Secondary | ICD-10-CM | POA: Insufficient documentation

## 2022-07-15 ENCOUNTER — Ambulatory Visit
Admission: RE | Admit: 2022-07-15 | Discharge: 2022-07-15 | Disposition: A | Discharge status: Home / Self Care | Payer: Medicare Other | Source: Ambulatory Visit | Attending: Physician Assistant | Admitting: Physician Assistant

## 2022-07-15 ENCOUNTER — Other Ambulatory Visit: Payer: Self-pay | Admitting: Physician Assistant

## 2022-07-15 DIAGNOSIS — M25522 Pain in left elbow: Secondary | ICD-10-CM | POA: Diagnosis present

## 2022-09-01 DIAGNOSIS — S53105D Unspecified dislocation of left ulnohumeral joint, subsequent encounter: Secondary | ICD-10-CM | POA: Diagnosis not present

## 2022-09-01 DIAGNOSIS — S52122D Displaced fracture of head of left radius, subsequent encounter for closed fracture with routine healing: Secondary | ICD-10-CM | POA: Diagnosis not present

## 2022-09-08 DIAGNOSIS — S52122D Displaced fracture of head of left radius, subsequent encounter for closed fracture with routine healing: Secondary | ICD-10-CM | POA: Diagnosis not present

## 2022-09-08 DIAGNOSIS — S53105D Unspecified dislocation of left ulnohumeral joint, subsequent encounter: Secondary | ICD-10-CM | POA: Diagnosis not present

## 2022-09-09 ENCOUNTER — Ambulatory Visit
Admission: RE | Admit: 2022-09-09 | Discharge: 2022-09-09 | Disposition: A | Discharge status: Home / Self Care | Payer: Medicare HMO | Source: Ambulatory Visit | Attending: Internal Medicine | Admitting: Internal Medicine

## 2022-09-09 DIAGNOSIS — Z1231 Encounter for screening mammogram for malignant neoplasm of breast: Secondary | ICD-10-CM | POA: Insufficient documentation

## 2022-09-10 DIAGNOSIS — R0602 Shortness of breath: Secondary | ICD-10-CM | POA: Diagnosis not present

## 2022-09-15 DIAGNOSIS — R0602 Shortness of breath: Secondary | ICD-10-CM | POA: Diagnosis not present

## 2022-09-15 DIAGNOSIS — R9430 Abnormal result of cardiovascular function study, unspecified: Secondary | ICD-10-CM | POA: Diagnosis not present

## 2022-09-15 DIAGNOSIS — I25118 Atherosclerotic heart disease of native coronary artery with other forms of angina pectoris: Secondary | ICD-10-CM | POA: Diagnosis not present

## 2022-09-15 DIAGNOSIS — G4733 Obstructive sleep apnea (adult) (pediatric): Secondary | ICD-10-CM | POA: Diagnosis not present

## 2022-09-15 DIAGNOSIS — S52122D Displaced fracture of head of left radius, subsequent encounter for closed fracture with routine healing: Secondary | ICD-10-CM | POA: Diagnosis not present

## 2022-09-15 DIAGNOSIS — S53105D Unspecified dislocation of left ulnohumeral joint, subsequent encounter: Secondary | ICD-10-CM | POA: Diagnosis not present

## 2022-09-15 DIAGNOSIS — Z955 Presence of coronary angioplasty implant and graft: Secondary | ICD-10-CM | POA: Diagnosis not present

## 2022-09-15 DIAGNOSIS — I25110 Atherosclerotic heart disease of native coronary artery with unstable angina pectoris: Secondary | ICD-10-CM | POA: Diagnosis not present

## 2022-09-15 DIAGNOSIS — E782 Mixed hyperlipidemia: Secondary | ICD-10-CM | POA: Diagnosis not present

## 2022-09-21 DIAGNOSIS — R0600 Dyspnea, unspecified: Secondary | ICD-10-CM | POA: Diagnosis not present

## 2022-09-22 DIAGNOSIS — S53105D Unspecified dislocation of left ulnohumeral joint, subsequent encounter: Secondary | ICD-10-CM | POA: Diagnosis not present

## 2022-09-22 DIAGNOSIS — S52122D Displaced fracture of head of left radius, subsequent encounter for closed fracture with routine healing: Secondary | ICD-10-CM | POA: Diagnosis not present

## 2022-09-29 DIAGNOSIS — S53195A Other dislocation of left ulnohumeral joint, initial encounter: Secondary | ICD-10-CM | POA: Diagnosis not present

## 2022-10-07 DIAGNOSIS — S52132A Displaced fracture of neck of left radius, initial encounter for closed fracture: Secondary | ICD-10-CM | POA: Diagnosis not present

## 2022-10-07 DIAGNOSIS — T84038A Mechanical loosening of other internal prosthetic joint, initial encounter: Secondary | ICD-10-CM | POA: Diagnosis not present

## 2022-10-07 DIAGNOSIS — S53195A Other dislocation of left ulnohumeral joint, initial encounter: Secondary | ICD-10-CM | POA: Diagnosis not present

## 2022-10-07 DIAGNOSIS — G8918 Other acute postprocedural pain: Secondary | ICD-10-CM | POA: Diagnosis not present

## 2022-10-13 DIAGNOSIS — S52122D Displaced fracture of head of left radius, subsequent encounter for closed fracture with routine healing: Secondary | ICD-10-CM | POA: Diagnosis not present

## 2022-10-13 DIAGNOSIS — S53105D Unspecified dislocation of left ulnohumeral joint, subsequent encounter: Secondary | ICD-10-CM | POA: Diagnosis not present

## 2022-10-13 DIAGNOSIS — S53195A Other dislocation of left ulnohumeral joint, initial encounter: Secondary | ICD-10-CM | POA: Diagnosis not present

## 2022-10-20 DIAGNOSIS — S53105D Unspecified dislocation of left ulnohumeral joint, subsequent encounter: Secondary | ICD-10-CM | POA: Diagnosis not present

## 2022-10-20 DIAGNOSIS — S52122D Displaced fracture of head of left radius, subsequent encounter for closed fracture with routine healing: Secondary | ICD-10-CM | POA: Diagnosis not present

## 2022-10-22 DIAGNOSIS — Z4889 Encounter for other specified surgical aftercare: Secondary | ICD-10-CM | POA: Diagnosis not present

## 2022-10-27 DIAGNOSIS — S53105D Unspecified dislocation of left ulnohumeral joint, subsequent encounter: Secondary | ICD-10-CM | POA: Diagnosis not present

## 2022-10-27 DIAGNOSIS — S52122D Displaced fracture of head of left radius, subsequent encounter for closed fracture with routine healing: Secondary | ICD-10-CM | POA: Diagnosis not present

## 2022-11-03 DIAGNOSIS — S52122D Displaced fracture of head of left radius, subsequent encounter for closed fracture with routine healing: Secondary | ICD-10-CM | POA: Diagnosis not present

## 2022-11-03 DIAGNOSIS — S53105D Unspecified dislocation of left ulnohumeral joint, subsequent encounter: Secondary | ICD-10-CM | POA: Diagnosis not present

## 2022-11-17 DIAGNOSIS — S52122D Displaced fracture of head of left radius, subsequent encounter for closed fracture with routine healing: Secondary | ICD-10-CM | POA: Diagnosis not present

## 2022-11-17 DIAGNOSIS — S53105A Unspecified dislocation of left ulnohumeral joint, initial encounter: Secondary | ICD-10-CM | POA: Diagnosis not present

## 2022-11-17 DIAGNOSIS — S53105D Unspecified dislocation of left ulnohumeral joint, subsequent encounter: Secondary | ICD-10-CM | POA: Diagnosis not present

## 2022-11-18 DIAGNOSIS — J4540 Moderate persistent asthma, uncomplicated: Secondary | ICD-10-CM | POA: Diagnosis not present

## 2022-12-01 ENCOUNTER — Emergency Department: Payer: Medicare HMO

## 2022-12-01 ENCOUNTER — Emergency Department
Admission: EM | Admit: 2022-12-01 | Discharge: 2022-12-01 | Disposition: A | Discharge status: Home / Self Care | Payer: Medicare HMO | Attending: Emergency Medicine | Admitting: Emergency Medicine

## 2022-12-01 ENCOUNTER — Other Ambulatory Visit: Payer: Self-pay

## 2022-12-01 ENCOUNTER — Encounter: Payer: Self-pay | Admitting: *Deleted

## 2022-12-01 DIAGNOSIS — R319 Hematuria, unspecified: Secondary | ICD-10-CM | POA: Insufficient documentation

## 2022-12-01 DIAGNOSIS — R350 Frequency of micturition: Secondary | ICD-10-CM | POA: Diagnosis not present

## 2022-12-01 DIAGNOSIS — R109 Unspecified abdominal pain: Secondary | ICD-10-CM | POA: Diagnosis not present

## 2022-12-01 DIAGNOSIS — R110 Nausea: Secondary | ICD-10-CM | POA: Diagnosis not present

## 2022-12-01 DIAGNOSIS — R300 Dysuria: Secondary | ICD-10-CM | POA: Diagnosis not present

## 2022-12-01 LAB — URINALYSIS, ROUTINE W REFLEX MICROSCOPIC
Bacteria, UA: NONE SEEN
Bilirubin Urine: NEGATIVE
Glucose, UA: NEGATIVE mg/dL
Ketones, ur: NEGATIVE mg/dL
Leukocytes,Ua: NEGATIVE
Nitrite: NEGATIVE
Protein, ur: NEGATIVE mg/dL
RBC / HPF: >50 RBC/hpf (ref 0–5)
Specific Gravity, Urine: 1.017 (ref 1.005–1.030)
pH: 6 (ref 5.0–8.0)

## 2022-12-01 LAB — CBC
HCT: 37.7 % (ref 36.0–46.0)
Hemoglobin: 12.1 g/dL (ref 12.0–15.0)
MCH: 27.4 pg (ref 26.0–34.0)
MCHC: 32.1 g/dL (ref 30.0–36.0)
MCV: 85.5 fL (ref 80.0–100.0)
Platelets: 263 10*3/uL (ref 150–400)
RBC: 4.41 MIL/uL (ref 3.87–5.11)
RDW: 15.8 % — ABNORMAL HIGH (ref 11.5–15.5)
WBC: 5.6 10*3/uL (ref 4.0–10.5)
nRBC: 0 % (ref 0.0–0.2)

## 2022-12-01 LAB — BASIC METABOLIC PANEL
Anion gap: 12 (ref 5–15)
BUN: 19 mg/dL (ref 8–23)
CO2: 23 mmol/L (ref 22–32)
Calcium: 9.5 mg/dL (ref 8.9–10.3)
Chloride: 105 mmol/L (ref 98–111)
Creatinine, Ser: 0.97 mg/dL (ref 0.44–1.00)
GFR, Estimated: >60 mL/min (ref >60)
Glucose, Bld: 109 mg/dL — ABNORMAL HIGH (ref 70–99)
Potassium: 3.7 mmol/L (ref 3.5–5.1)
Sodium: 140 mmol/L (ref 135–145)

## 2022-12-01 LAB — HEPATIC FUNCTION PANEL
ALT: 16 U/L (ref 0–44)
AST: 26 U/L (ref 15–41)
Albumin: 4.2 g/dL (ref 3.5–5.0)
Alkaline Phosphatase: 106 U/L (ref 38–126)
Bilirubin, Direct: <0.1 mg/dL (ref 0.0–0.2)
Total Bilirubin: 0.6 mg/dL (ref 0.3–1.2)
Total Protein: 7.5 g/dL (ref 6.5–8.1)

## 2022-12-01 LAB — LIPASE, BLOOD: Lipase: 33 U/L (ref 11–51)

## 2022-12-01 MED ORDER — ONDANSETRON 4 MG PO TBDP: 4.0000 mg | ORAL_TABLET | Freq: Three times a day (TID) | ORAL | 0 refills | Status: DC | PRN

## 2022-12-01 MED ORDER — KETOROLAC TROMETHAMINE 30 MG/ML IJ SOLN
15.0000 mg | Freq: Once | INTRAMUSCULAR | Status: AC
  Administered 2022-12-01: 15 mg via INTRAVENOUS
  Filled 2022-12-01: qty 1

## 2022-12-01 MED ORDER — MORPHINE SULFATE (PF) 4 MG/ML IV SOLN
4.0000 mg | Freq: Once | INTRAVENOUS | Status: AC
Start: 2022-12-01 — End: 2022-12-01
  Administered 2022-12-01: 4 mg via INTRAVENOUS
  Filled 2022-12-01: qty 1

## 2022-12-01 MED ORDER — TAMSULOSIN HCL 0.4 MG PO CAPS: 0.4000 mg | ORAL_CAPSULE | Freq: Every day | ORAL | 0 refills | Status: AC

## 2022-12-01 MED ORDER — HYDROCODONE-ACETAMINOPHEN 5-325 MG PO TABS: 1.0000 | ORAL_TABLET | Freq: Four times a day (QID) | ORAL | 0 refills | Status: DC | PRN

## 2022-12-01 MED ORDER — SODIUM CHLORIDE 0.9 % IV BOLUS
1000.0000 mL | Freq: Once | INTRAVENOUS | Status: AC
  Administered 2022-12-01: 1000 mL via INTRAVENOUS

## 2022-12-01 NOTE — ED Triage Notes (Signed)
Pt has right flank pain for 1 day.  Pt has nausea.  Hx kidney stones  pt alert.

## 2022-12-01 NOTE — ED Provider Notes (Signed)
[facility] Provider Note    Event Date/Time   First MD Initiated Contact with Patient 12/01/22 1915     (approximate)   History   Flank Pain   HPI  Renee Stephens is a 67 y.o. female here with flank pain.  The patient states that earlier today, she developed an aching, throbbing, right flank pain.  She then developed some worsening pain rating down towards her right groin.  She had some associated dysuria.  She had some frequency.  History of stent to similar symptoms.  She had some mild nausea but no vomiting.  No fevers or chills.  No preceding symptoms.  No changes in bowel movements.     Physical Exam   Triage Vital Signs: ED Triage Vitals  Enc Vitals Group     BP 12/01/22 1652 134/67     Pulse Rate 12/01/22 1652 70     Resp 12/01/22 1652 19     Temp 12/01/22 1652 97.7 F (36.5 C)     Temp Source 12/01/22 1652 Oral     SpO2 12/01/22 1652 99 %     Weight 12/01/22 1654 175 lb (79.4 kg)     Height 12/01/22 1654 5\' 6"  (1.676 m)     Head Circumference --      Peak Flow --      Pain Score 12/01/22 1654 10     Pain Loc --      Pain Edu? --      Excl. in [place]? --     Most recent vital signs: Vitals:   12/01/22 1652 12/01/22 2145  BP: 134/67 128/68  Pulse: 70 72  Resp: 19 18  Temp: 97.7 F (36.5 C) 97.9 F (36.6 C)  SpO2: 99% 99%     General: Awake, no distress.  CV:  Good peripheral perfusion.  Regular rate and rhythm. Resp:  Normal work of breathing.  Abd:  No distention.  Mild tenderness to palpation in the right flank.  No CVA tenderness.  No rebound or guarding.  No right lower quadrant tenderness. Other:  Well-appearing.   ED Results / Procedures / Treatments   Labs (all labs ordered are listed, but only abnormal results are displayed) Labs Reviewed  URINALYSIS, ROUTINE W REFLEX MICROSCOPIC - Abnormal; Notable for the following components:      Result Value   Color, Urine YELLOW (*)    APPearance HAZY (*)    Hgb  urine dipstick LARGE (*)    All other components within normal limits  BASIC METABOLIC PANEL - Abnormal; Notable for the following components:   Glucose, Bld 109 (*)    All other components within normal limits  CBC - Abnormal; Notable for the following components:   RDW 15.8 (*)    All other components within normal limits  HEPATIC FUNCTION PANEL  LIPASE, BLOOD     EKG    RADIOLOGY CT stone: No acute findings   I also independently reviewed and agree with radiologist interpretations.   PROCEDURES:  Critical Care performed: No   MEDICATIONS ORDERED IN ED: Medications  ketorolac (TORADOL) 30 MG/ML injection 15 mg (15 mg Intravenous Given 12/01/22 2016)  sodium chloride 0.9 % bolus 1,000 mL (0 mLs Intravenous Stopped 12/01/22 2134)  morphine (PF) 4 MG/ML injection 4 mg (4 mg Intravenous Given 12/01/22 2018)     IMPRESSION / MDM / ASSESSMENT AND PLAN / ED COURSE  I reviewed the triage vital signs and the nursing notes.  Differential diagnosis includes, but is not limited to, renal colic, UTI, pyelonephritis, musculoskeletal flank pain, unlikely appendicitis, ovarian cyst  Patient's presentation is most consistent with acute presentation with potential threat to life or bodily function.  Well-appearing 67 year old female here with right flank pain.  Suspect stone, possibly recently passed as her pain is improved.  CBC shows no leukocytosis.  BMP shows normal renal function.  No right upper quadrant pain or evidence suggest cholecystitis.  Urinalysis shows moderate hematuria, no evidence of infection.  CT renal stone shows no acute findings.  No right upper quadrant pain.  The gallbladder appears normal on CT.  No urolithiasis hydronephrosis.  No right upper quadrant tenderness, negative Murphy's.  Suspect possible recently passed stone.  No other acute abnormalities.  Will discharge home   FINAL CLINICAL IMPRESSION(S) / ED DIAGNOSES   Final  diagnoses:  Flank pain     Rx / DC Orders   ED Discharge Orders          Ordered    HYDROcodone-acetaminophen (NORCO/VICODIN) 5-325 MG tablet  Every 6 hours PRN        12/01/22 2127    ondansetron (ZOFRAN-ODT) 4 MG disintegrating tablet  Every 8 hours PRN        12/01/22 2127    tamsulosin (FLOMAX) 0.4 MG CAPS capsule  Daily after supper        12/01/22 2127             Note:  This document was prepared using Dragon voice recognition software and may include unintentional dictation errors.   Duffy Bruce, MD 12/01/22 2204

## 2022-12-04 ENCOUNTER — Telehealth: Payer: Self-pay

## 2022-12-04 NOTE — Telephone Encounter (Signed)
     Patient  visit on 12/01/2022  at [facility] was for flank pain.  Have you been able to follow up with your primary care physician? Yes  The patient was or was not able to obtain any needed medicine or equipment. Patient is able to obtain medication.  Are there diet recommendations that you are having difficulty following? No  Patient expresses understanding of discharge instructions and education provided has no other needs at this time. Yes   Zinia Innocent [facility]  [facility] Population Health Community Resource Care Guide   [email or web address]  ?? [phone]   Website: [email or web address]  [email or web address]

## 2022-12-08 DIAGNOSIS — S53105D Unspecified dislocation of left ulnohumeral joint, subsequent encounter: Secondary | ICD-10-CM | POA: Diagnosis not present

## 2022-12-08 DIAGNOSIS — S52122D Displaced fracture of head of left radius, subsequent encounter for closed fracture with routine healing: Secondary | ICD-10-CM | POA: Diagnosis not present

## 2022-12-16 DIAGNOSIS — R7303 Prediabetes: Secondary | ICD-10-CM | POA: Diagnosis not present

## 2022-12-16 DIAGNOSIS — F419 Anxiety disorder, unspecified: Secondary | ICD-10-CM | POA: Diagnosis not present

## 2022-12-16 DIAGNOSIS — Z79899 Other long term (current) drug therapy: Secondary | ICD-10-CM | POA: Diagnosis not present

## 2022-12-16 DIAGNOSIS — E7800 Pure hypercholesterolemia, unspecified: Secondary | ICD-10-CM | POA: Diagnosis not present

## 2022-12-16 DIAGNOSIS — I2510 Atherosclerotic heart disease of native coronary artery without angina pectoris: Secondary | ICD-10-CM | POA: Diagnosis not present

## 2022-12-22 DIAGNOSIS — G2581 Restless legs syndrome: Secondary | ICD-10-CM | POA: Diagnosis not present

## 2022-12-22 DIAGNOSIS — I6381 Other cerebral infarction due to occlusion or stenosis of small artery: Secondary | ICD-10-CM | POA: Diagnosis not present

## 2022-12-22 DIAGNOSIS — R202 Paresthesia of skin: Secondary | ICD-10-CM | POA: Diagnosis not present

## 2022-12-22 DIAGNOSIS — G250 Essential tremor: Secondary | ICD-10-CM | POA: Diagnosis not present

## 2022-12-22 DIAGNOSIS — I2510 Atherosclerotic heart disease of native coronary artery without angina pectoris: Secondary | ICD-10-CM | POA: Diagnosis not present

## 2022-12-22 DIAGNOSIS — M79609 Pain in unspecified limb: Secondary | ICD-10-CM | POA: Diagnosis not present

## 2022-12-22 DIAGNOSIS — R0683 Snoring: Secondary | ICD-10-CM | POA: Diagnosis not present

## 2022-12-29 DIAGNOSIS — S53105D Unspecified dislocation of left ulnohumeral joint, subsequent encounter: Secondary | ICD-10-CM | POA: Diagnosis not present

## 2022-12-29 DIAGNOSIS — S52122D Displaced fracture of head of left radius, subsequent encounter for closed fracture with routine healing: Secondary | ICD-10-CM | POA: Diagnosis not present

## 2023-02-16 DIAGNOSIS — S52122D Displaced fracture of head of left radius, subsequent encounter for closed fracture with routine healing: Secondary | ICD-10-CM | POA: Diagnosis not present

## 2023-02-16 DIAGNOSIS — S53105D Unspecified dislocation of left ulnohumeral joint, subsequent encounter: Secondary | ICD-10-CM | POA: Diagnosis not present

## 2023-02-16 DIAGNOSIS — M25522 Pain in left elbow: Secondary | ICD-10-CM | POA: Diagnosis not present

## 2023-03-23 DIAGNOSIS — H903 Sensorineural hearing loss, bilateral: Secondary | ICD-10-CM | POA: Diagnosis not present

## 2023-03-23 DIAGNOSIS — H6123 Impacted cerumen, bilateral: Secondary | ICD-10-CM | POA: Diagnosis not present

## 2023-03-24 DIAGNOSIS — Z955 Presence of coronary angioplasty implant and graft: Secondary | ICD-10-CM | POA: Diagnosis not present

## 2023-03-24 DIAGNOSIS — E782 Mixed hyperlipidemia: Secondary | ICD-10-CM | POA: Diagnosis not present

## 2023-03-24 DIAGNOSIS — I2510 Atherosclerotic heart disease of native coronary artery without angina pectoris: Secondary | ICD-10-CM | POA: Diagnosis not present

## 2023-03-24 DIAGNOSIS — G4733 Obstructive sleep apnea (adult) (pediatric): Secondary | ICD-10-CM | POA: Diagnosis not present

## 2023-03-26 DIAGNOSIS — S5002XA Contusion of left elbow, initial encounter: Secondary | ICD-10-CM | POA: Diagnosis not present

## 2023-04-01 DIAGNOSIS — R202 Paresthesia of skin: Secondary | ICD-10-CM | POA: Diagnosis not present

## 2023-04-01 DIAGNOSIS — G250 Essential tremor: Secondary | ICD-10-CM | POA: Diagnosis not present

## 2023-04-01 DIAGNOSIS — G2581 Restless legs syndrome: Secondary | ICD-10-CM | POA: Diagnosis not present

## 2023-04-01 DIAGNOSIS — R519 Headache, unspecified: Secondary | ICD-10-CM | POA: Diagnosis not present

## 2023-04-01 DIAGNOSIS — M79609 Pain in unspecified limb: Secondary | ICD-10-CM | POA: Diagnosis not present

## 2023-04-01 DIAGNOSIS — Z9181 History of falling: Secondary | ICD-10-CM | POA: Diagnosis not present

## 2023-04-21 DIAGNOSIS — H5203 Hypermetropia, bilateral: Secondary | ICD-10-CM | POA: Diagnosis not present

## 2023-06-01 DIAGNOSIS — M25522 Pain in left elbow: Secondary | ICD-10-CM | POA: Diagnosis not present

## 2023-06-24 DIAGNOSIS — R7303 Prediabetes: Secondary | ICD-10-CM | POA: Diagnosis not present

## 2023-06-24 DIAGNOSIS — E785 Hyperlipidemia, unspecified: Secondary | ICD-10-CM | POA: Diagnosis not present

## 2023-06-24 DIAGNOSIS — Z1231 Encounter for screening mammogram for malignant neoplasm of breast: Secondary | ICD-10-CM | POA: Diagnosis not present

## 2023-06-24 DIAGNOSIS — Z0000 Encounter for general adult medical examination without abnormal findings: Secondary | ICD-10-CM | POA: Diagnosis not present

## 2023-06-24 DIAGNOSIS — F32A Depression, unspecified: Secondary | ICD-10-CM | POA: Diagnosis not present

## 2023-06-24 DIAGNOSIS — Z79899 Other long term (current) drug therapy: Secondary | ICD-10-CM | POA: Diagnosis not present

## 2023-06-24 DIAGNOSIS — I2510 Atherosclerotic heart disease of native coronary artery without angina pectoris: Secondary | ICD-10-CM | POA: Diagnosis not present

## 2023-06-24 DIAGNOSIS — Z23 Encounter for immunization: Secondary | ICD-10-CM | POA: Diagnosis not present

## 2023-06-25 ENCOUNTER — Other Ambulatory Visit: Payer: Self-pay | Admitting: Internal Medicine

## 2023-06-25 DIAGNOSIS — Z1231 Encounter for screening mammogram for malignant neoplasm of breast: Secondary | ICD-10-CM

## 2023-07-01 DIAGNOSIS — Z01818 Encounter for other preprocedural examination: Secondary | ICD-10-CM | POA: Diagnosis not present

## 2023-07-06 DIAGNOSIS — J4540 Moderate persistent asthma, uncomplicated: Secondary | ICD-10-CM | POA: Diagnosis not present

## 2023-07-06 DIAGNOSIS — J8283 Eosinophilic asthma: Secondary | ICD-10-CM | POA: Diagnosis not present

## 2023-07-09 DIAGNOSIS — T84199A Other mechanical complication of internal fixation device of unspecified bone of limb, initial encounter: Secondary | ICD-10-CM | POA: Diagnosis not present

## 2023-07-09 DIAGNOSIS — Z472 Encounter for removal of internal fixation device: Secondary | ICD-10-CM | POA: Diagnosis not present

## 2023-07-09 DIAGNOSIS — Z955 Presence of coronary angioplasty implant and graft: Secondary | ICD-10-CM | POA: Diagnosis not present

## 2023-07-09 DIAGNOSIS — G8918 Other acute postprocedural pain: Secondary | ICD-10-CM | POA: Diagnosis not present

## 2023-07-09 DIAGNOSIS — T8484XA Pain due to internal orthopedic prosthetic devices, implants and grafts, initial encounter: Secondary | ICD-10-CM | POA: Diagnosis not present

## 2023-07-09 DIAGNOSIS — Z8673 Personal history of transient ischemic attack (TIA), and cerebral infarction without residual deficits: Secondary | ICD-10-CM | POA: Diagnosis not present

## 2023-07-13 DIAGNOSIS — M25622 Stiffness of left elbow, not elsewhere classified: Secondary | ICD-10-CM | POA: Diagnosis not present

## 2023-07-22 DIAGNOSIS — Z4889 Encounter for other specified surgical aftercare: Secondary | ICD-10-CM | POA: Diagnosis not present

## 2023-07-22 DIAGNOSIS — S53105D Unspecified dislocation of left ulnohumeral joint, subsequent encounter: Secondary | ICD-10-CM | POA: Diagnosis not present

## 2023-07-22 DIAGNOSIS — S52122D Displaced fracture of head of left radius, subsequent encounter for closed fracture with routine healing: Secondary | ICD-10-CM | POA: Diagnosis not present

## 2023-07-22 DIAGNOSIS — M25622 Stiffness of left elbow, not elsewhere classified: Secondary | ICD-10-CM | POA: Diagnosis not present

## 2023-08-03 DIAGNOSIS — M25622 Stiffness of left elbow, not elsewhere classified: Secondary | ICD-10-CM | POA: Diagnosis not present

## 2023-08-12 DIAGNOSIS — M25622 Stiffness of left elbow, not elsewhere classified: Secondary | ICD-10-CM | POA: Diagnosis not present

## 2023-08-16 ENCOUNTER — Ambulatory Visit (INDEPENDENT_AMBULATORY_CARE_PROVIDER_SITE_OTHER): Payer: Medicare HMO | Admitting: Psychiatry

## 2023-08-16 ENCOUNTER — Encounter: Payer: Self-pay | Admitting: Psychiatry

## 2023-08-16 VITALS — BP 119/75 | HR 64 | Temp 96.7°F | Ht 66.0 in | Wt 179.0 lb

## 2023-08-16 DIAGNOSIS — F3341 Major depressive disorder, recurrent, in partial remission: Secondary | ICD-10-CM | POA: Diagnosis not present

## 2023-08-16 DIAGNOSIS — Z9189 Other specified personal risk factors, not elsewhere classified: Secondary | ICD-10-CM | POA: Diagnosis not present

## 2023-08-16 DIAGNOSIS — Z630 Problems in relationship with spouse or partner: Secondary | ICD-10-CM | POA: Insufficient documentation

## 2023-08-16 NOTE — Patient Instructions (Addendum)
Monitor for metoprolol-related adverse reactions, including bradycardia and hypotension, during coadministration with bupropion. Concomitant use may increase metoprolol serum concentrations which would decrease the cardioselectivity of metoprolol.     Please check the dose of Buspirone and let me know.   Please call for EKG - [phone]   If you are experiencing a medical or mental health crisis, please call [phone], [phone], or go to your nearest emergency room for immediate assistance. You can also contact  [facility] crisis unit [phone].[facility] Crisis Unit at [phone] to be assessed over the phone or in person.

## 2023-08-16 NOTE — Progress Notes (Deleted)
duplicate

## 2023-08-16 NOTE — Progress Notes (Deleted)
error 
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
## 2023-08-17 ENCOUNTER — Encounter: Payer: Self-pay | Admitting: Psychiatry

## 2023-08-18 ENCOUNTER — Ambulatory Visit
Admission: RE | Admit: 2023-08-18 | Discharge: 2023-08-18 | Disposition: A | Discharge status: Home / Self Care | Payer: Medicare HMO | Source: Ambulatory Visit | Attending: Psychiatry | Admitting: Psychiatry

## 2023-08-18 DIAGNOSIS — Z9189 Other specified personal risk factors, not elsewhere classified: Secondary | ICD-10-CM | POA: Insufficient documentation

## 2023-08-18 DIAGNOSIS — Z136 Encounter for screening for cardiovascular disorders: Secondary | ICD-10-CM | POA: Diagnosis not present

## 2023-08-18 DIAGNOSIS — I4519 Other right bundle-branch block: Secondary | ICD-10-CM | POA: Diagnosis not present

## 2023-08-18 DIAGNOSIS — R001 Bradycardia, unspecified: Secondary | ICD-10-CM | POA: Insufficient documentation

## 2023-08-18 DIAGNOSIS — R9431 Abnormal electrocardiogram [ECG] [EKG]: Secondary | ICD-10-CM | POA: Insufficient documentation

## 2023-08-19 DIAGNOSIS — R1084 Generalized abdominal pain: Secondary | ICD-10-CM | POA: Diagnosis not present

## 2023-08-20 ENCOUNTER — Other Ambulatory Visit: Payer: Self-pay | Admitting: Internal Medicine

## 2023-08-20 ENCOUNTER — Telehealth: Payer: Self-pay | Admitting: Psychiatry

## 2023-08-20 DIAGNOSIS — R1084 Generalized abdominal pain: Secondary | ICD-10-CM

## 2023-08-20 NOTE — Telephone Encounter (Signed)
I have reviewed EKG dated 08/18/2023-sinus bradycardia, incomplete right bundle branch block.  We will have patient contact primary care provider to discuss EKG changes.

## 2023-08-23 ENCOUNTER — Ambulatory Visit
Admission: RE | Admit: 2023-08-23 | Discharge: 2023-08-23 | Disposition: A | Discharge status: Home / Self Care | Payer: Medicare HMO | Source: Ambulatory Visit | Attending: Internal Medicine | Admitting: Internal Medicine

## 2023-08-23 DIAGNOSIS — R1084 Generalized abdominal pain: Secondary | ICD-10-CM | POA: Insufficient documentation

## 2023-08-23 DIAGNOSIS — R109 Unspecified abdominal pain: Secondary | ICD-10-CM | POA: Diagnosis not present

## 2023-08-23 NOTE — Telephone Encounter (Signed)
Called patient as instructed by provider to discuss the EKG results patient [place] due to having some abdominal pains and she would discuss the EKG with her PCP also.

## 2023-08-30 ENCOUNTER — Telehealth: Payer: Self-pay | Admitting: Psychiatry

## 2023-08-30 NOTE — Telephone Encounter (Signed)
error 

## 2023-08-31 ENCOUNTER — Ambulatory Visit: Payer: Medicare HMO | Admitting: Psychiatry

## 2023-08-31 DIAGNOSIS — M25622 Stiffness of left elbow, not elsewhere classified: Secondary | ICD-10-CM | POA: Diagnosis not present

## 2023-09-03 DIAGNOSIS — I25118 Atherosclerotic heart disease of native coronary artery with other forms of angina pectoris: Secondary | ICD-10-CM | POA: Diagnosis not present

## 2023-09-03 DIAGNOSIS — R079 Chest pain, unspecified: Secondary | ICD-10-CM | POA: Diagnosis not present

## 2023-09-03 DIAGNOSIS — E782 Mixed hyperlipidemia: Secondary | ICD-10-CM | POA: Diagnosis not present

## 2023-09-03 DIAGNOSIS — Z955 Presence of coronary angioplasty implant and graft: Secondary | ICD-10-CM | POA: Diagnosis not present

## 2023-09-07 ENCOUNTER — Other Ambulatory Visit: Payer: Self-pay | Admitting: Internal Medicine

## 2023-09-07 ENCOUNTER — Telehealth (HOSPITAL_COMMUNITY): Payer: Self-pay | Admitting: *Deleted

## 2023-09-07 DIAGNOSIS — G2581 Restless legs syndrome: Secondary | ICD-10-CM | POA: Diagnosis not present

## 2023-09-07 DIAGNOSIS — R202 Paresthesia of skin: Secondary | ICD-10-CM | POA: Diagnosis not present

## 2023-09-07 DIAGNOSIS — R208 Other disturbances of skin sensation: Secondary | ICD-10-CM | POA: Diagnosis not present

## 2023-09-07 DIAGNOSIS — E782 Mixed hyperlipidemia: Secondary | ICD-10-CM

## 2023-09-07 DIAGNOSIS — M79609 Pain in unspecified limb: Secondary | ICD-10-CM | POA: Diagnosis not present

## 2023-09-07 DIAGNOSIS — Z79899 Other long term (current) drug therapy: Secondary | ICD-10-CM | POA: Diagnosis not present

## 2023-09-07 DIAGNOSIS — R519 Headache, unspecified: Secondary | ICD-10-CM | POA: Diagnosis not present

## 2023-09-07 DIAGNOSIS — G250 Essential tremor: Secondary | ICD-10-CM | POA: Diagnosis not present

## 2023-09-07 DIAGNOSIS — R079 Chest pain, unspecified: Secondary | ICD-10-CM

## 2023-09-07 DIAGNOSIS — I2510 Atherosclerotic heart disease of native coronary artery without angina pectoris: Secondary | ICD-10-CM

## 2023-09-07 DIAGNOSIS — Z955 Presence of coronary angioplasty implant and graft: Secondary | ICD-10-CM

## 2023-09-07 NOTE — Telephone Encounter (Signed)
 Left message cancelling patient's upcoming cardiac CT appointment.  Larey Brick RN Navigator Cardiac Imaging [facility] [phone] Office [phone] Cell

## 2023-09-09 ENCOUNTER — Ambulatory Visit: Admission: RE | Admit: 2023-09-09 | Payer: Medicare HMO | Source: Ambulatory Visit

## 2023-09-09 ENCOUNTER — Ambulatory Visit (INDEPENDENT_AMBULATORY_CARE_PROVIDER_SITE_OTHER): Payer: Medicare HMO | Admitting: Professional Counselor

## 2023-09-09 DIAGNOSIS — F4323 Adjustment disorder with mixed anxiety and depressed mood: Secondary | ICD-10-CM
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
## 2023-09-10 ENCOUNTER — Other Ambulatory Visit: Payer: Self-pay | Admitting: Internal Medicine

## 2023-09-10 DIAGNOSIS — I25110 Atherosclerotic heart disease of native coronary artery with unstable angina pectoris: Secondary | ICD-10-CM

## 2023-09-13 ENCOUNTER — Ambulatory Visit
Admission: RE | Admit: 2023-09-13 | Discharge: 2023-09-13 | Disposition: A | Discharge status: Home / Self Care | Payer: Medicare HMO | Source: Ambulatory Visit | Attending: Internal Medicine | Admitting: Internal Medicine

## 2023-09-13 DIAGNOSIS — Z1231 Encounter for screening mammogram for malignant neoplasm of breast: Secondary | ICD-10-CM | POA: Diagnosis not present

## 2023-09-14 DIAGNOSIS — M25622 Stiffness of left elbow, not elsewhere classified: Secondary | ICD-10-CM | POA: Diagnosis not present

## 2023-09-22 DIAGNOSIS — H6123 Impacted cerumen, bilateral: Secondary | ICD-10-CM | POA: Diagnosis not present

## 2023-09-22 DIAGNOSIS — H903 Sensorineural hearing loss, bilateral: Secondary | ICD-10-CM | POA: Diagnosis not present

## 2023-09-23 ENCOUNTER — Ambulatory Visit: Payer: Medicare HMO | Admitting: Professional Counselor

## 2023-09-23 DIAGNOSIS — F4323 Adjustment disorder with mixed anxiety and depressed mood: Secondary | ICD-10-CM

## 2023-09-23 NOTE — Progress Notes (Signed)
   THERAPIST PROGRESS NOTE  Session Time: 2:00 PM - 2:55 PM  Participation Level: Active  Behavioral Response: Well Groomed, Alert, Euthymic  Type of Therapy: Individual Therapy  Treatment Goals addressed: Active OP Depression  LTG: "I want to take better care of myself. "    Start:  09/23/23    Expected End:  09/21/24     STG: "Improving my sleep." To improve sleep hygiene AEB implementing a nighttime routine and coping mechanisms over the next 12 weeks.    STG: "I want to work on improving my friendships. (Also marriage)" To improve relationship satisfaction AEB increasing socialization and use of interpersonal effectiveness skills.   ProgressTowards Goals: Initial  Interventions: Motivational Interviewing and Solution Focused  Summary: Schwanda Jarratt is a 68 y.o. female who presents with adjustment disorder and MDD. Anjannette appeared alert and oriented x5. She was neatly dressed and well-groomed. She reported she has been thinking about her goals and made notes of some but forgot her notebook. She was able to remember some of them, such as working on her health. She discussed various health issues and noted sleep is a major issue she struggles with. She engaged in exploring options for sleep hygiene and is willing to try audio stories. Zarah noted she listens to rain sounds and that helps sometimes. She also identified a goal to work on her friendships and marriage. She will be seen again in three weeks.   Therapist Response: Conducted session with [name]. Began session with check-in/update since initial CCA. Used empathetic and reflective listening. Developed treatment plan with input from [place] on current strengths, needs, and progress towards goals. Discussed ways to improve sleep hygiene - audio bedtime stories, routine, and encouraged Mykhia to discuss potential medications or OTC options for sleep aids. Scheduled next appointment and concluded session.  .   Suicidal/Homicidal: No  Plan: Return again in 3 weeks.  Diagnosis: Adjustment disorder with mixed anxiety and depressed mood  Collaboration of Care: Medication Management AEB chart review  Patient/Guardian was advised Release of Information must be obtained prior to any record release in order to collaborate their care with an outside provider. Patient/Guardian was advised if they have not already done so to contact the registration department to sign all necessary forms in order for [place] to release information regarding their care.   Consent: Patient/Guardian gives verbal consent for treatment and assignment of benefits for services provided during this visit. Patient/Guardian expressed understanding and agreed to proceed.   Edmonia Lynch, [facility] 09/23/2023

## 2023-09-24 ENCOUNTER — Ambulatory Visit: Payer: Medicare HMO | Admitting: Professional Counselor

## 2023-09-27 ENCOUNTER — Encounter: Payer: Self-pay | Admitting: Psychiatry

## 2023-09-27 ENCOUNTER — Ambulatory Visit (INDEPENDENT_AMBULATORY_CARE_PROVIDER_SITE_OTHER): Payer: Medicare HMO | Admitting: Psychiatry

## 2023-09-27 VITALS — BP 125/74 | HR 69 | Temp 96.1°F | Ht 66.0 in | Wt 178.0 lb

## 2023-09-27 DIAGNOSIS — F3341 Major depressive disorder, recurrent, in partial remission: Secondary | ICD-10-CM

## 2023-09-27 DIAGNOSIS — I2510 Atherosclerotic heart disease of native coronary artery without angina pectoris: Secondary | ICD-10-CM | POA: Diagnosis not present

## 2023-09-27 DIAGNOSIS — E7800 Pure hypercholesterolemia, unspecified: Secondary | ICD-10-CM | POA: Diagnosis not present

## 2023-09-27 DIAGNOSIS — G4700 Insomnia, unspecified: Secondary | ICD-10-CM

## 2023-09-27 DIAGNOSIS — F419 Anxiety disorder, unspecified: Secondary | ICD-10-CM | POA: Diagnosis not present

## 2023-09-27 DIAGNOSIS — R7303 Prediabetes: Secondary | ICD-10-CM | POA: Diagnosis not present

## 2023-09-27 DIAGNOSIS — Z79899 Other long term (current) drug therapy: Secondary | ICD-10-CM | POA: Diagnosis not present

## 2023-09-27 MED ORDER — ESZOPICLONE 1 MG PO TABS: 1.0000 mg | ORAL_TABLET | Freq: Every evening | ORAL | 0 refills | Status: DC | PRN

## 2023-09-27 MED ORDER — FLUOXETINE HCL 40 MG PO CAPS: 40.0000 mg | ORAL_CAPSULE | Freq: Every day | ORAL | 1 refills | Status: DC

## 2023-09-27 MED ORDER — BUPROPION HCL 100 MG PO TABS: 100.0000 mg | ORAL_TABLET | Freq: Every morning | ORAL | 1 refills | Status: DC

## 2023-09-27 NOTE — Patient Instructions (Signed)
ARIPiprazole (ABILIFY) 2 MG tablet    Eszopiclone Tablets What is this medication? ESZOPICLONE (es ZOE pi clone) treats insomnia. It helps you go to sleep faster and stay asleep through the night. It is often used for a short period of time. This medicine may be used for other purposes; ask your health care provider or pharmacist if you have questions. COMMON BRAND NAME(S): Lunesta What should I tell my care team before I take this medication? They need to know if you have any of these conditions: Depression Liver disease Lung or breathing disease, such as asthma or COPD Substance use disorder Sleep-walking, driving, eating, or other activity while not fully awake after taking a sleep medication Suicidal thoughts, plans, or attempt by you or a family member An unusual or allergic reaction to eszopiclone, other medications, foods, dyes, or preservatives Pregnant or trying to get pregnant Breastfeeding How should I use this medication? Take this medication by mouth with a glass of water. Follow the directions on the prescription label. It is better to take this medication on an empty stomach and only when you are ready for bed. Do not take your medication more often than directed. If you have been taking this medication for several weeks and suddenly stop taking it, you may get unpleasant withdrawal symptoms. Your care team may want to gradually reduce the dose. Do not stop taking this medication on your own. Always follow your care team's advice. A special MedGuide will be given to you by the pharmacist with each prescription and refill. Be sure to read this information carefully each time. Talk to your care team about the use of this medication in children. Special care may be needed. Overdosage: If you think you have taken too much of this medicine contact a poison control center or emergency room at once. NOTE: This medicine is only for you. Do not share this medicine with others. What if  I miss a dose? This does not apply. This medication should only be taken immediately before going to sleep. Do not take double or extra doses. What may interact with this medication? Lorazepam Medications for fungal infections, such as ketoconazole, fluconazole, itraconazole Olanzapine Supplements, such as melatonin, St. John's wort, valerian This list may not describe all possible interactions. Give your health care provider a list of all the medicines, herbs, non-prescription drugs, or dietary supplements you use. Also tell them if you smoke, drink alcohol, or use illegal drugs. Some items may interact with your medicine. What should I watch for while using this medication? Visit your care team for regular checks on your progress. Keep a regular sleep schedule by going to bed at about the same time nightly. Avoid caffeine-containing drinks in the evening hours, as caffeine can cause trouble with falling asleep. Talk to your care team if you still have trouble sleeping. You may do unusual sleep behaviors or activities you do not remember the day after taking this medication. Activities include driving, making or eating food, talking on the phone, sexual activity, or sleep walking. Stop taking this medication and call your care team right away if you find out you have done activities like this. Plan to go to bed and stay in bed for a full night (7 to 8 hours) after you take this medication. You may still be drowsy the morning after taking this medication. This medication may affect your coordination, reaction time, or judgment. Do not drive or operate machinery until you know how this medication affects you. Sit  up or stand slowly to reduce the risk of dizzy or fainting spells. If you or your family notice any changes in your behavior, such as new or worsening depression, thoughts of harming yourself, anxiety, other unusual or disturbing thoughts, or memory loss, call your care team right away. After  you stop taking this medication, you may have trouble falling asleep. This is called rebound insomnia. This problem usually goes away on its own after 1 or 2 nights. What side effects may I notice from receiving this medication? Side effects that you should report to your care team as soon as possible: Allergic reactions or angioedema--skin rash, itching, hives, swelling of the face, eyes, lips, tongue, arms, or legs, trouble swallowing or breathing CNS depression--slow or shallow breathing, shortness of breath, feeling faint, dizziness, confusion, trouble staying awake Mood and behavior changes--anxiety, nervousness, confusion, hallucinations, irritability, hostility, thoughts of suicide or self-harm, worsening mood, feelings of depression Unusual sleep behaviors or activities you do not remember such as driving, eating, or sexual activity Side effects that usually do not require medical attention (report to your care team if they continue or are bothersome): Dizziness Drowsiness the day after use Dry mouth Headache Metallic taste in mouth This list may not describe all possible side effects. Call your doctor for medical advice about side effects. You may report side effects to FDA at 1-800-FDA-1088. Where should I keep my medication? Keep out of the reach of children and pets. This medication can be abused. Keep it in a safe place to protect it from theft. Do not share it with anyone. It is only for you. Selling or giving away this medication is dangerous and against the law. Store at room temperature between 20 and 25 degrees C (68 and 77 degrees F). Get rid of any unused medication after the expiration date. This medication may cause harm and death if taken by other adults, children, or pets. It is important to get rid of this medication as soon as you no longer need it or it has expired. You can do this in two ways: Take the medication to a medication take-back program. Check with your  pharmacy or law enforcement to find a location. If you cannot return the medication, check the label or package insert to see if the medication should be thrown out in the garbage or flushed down the toilet. If you are not sure, ask your care team. If it is safe to put it in the trash, take the medication out of the container. Mix the medication with cat litter, dirt, coffee grounds, or other unwanted substance. Seal the mixture in a bag or container. Put it in the trash. NOTE: This sheet is a summary. It may not cover all possible information. If you have questions about this medicine, talk to your doctor, pharmacist, or health care provider.  2024 Elsevier/Gold Standard (2023-02-18 00:00:00)

## 2023-09-27 NOTE — Progress Notes (Signed)
BH MD OP Progress Note  09/27/2023 4:42 PM Renee Stephens  MRN:  [id number]  Chief Complaint:  Chief Complaint  Patient presents with   Follow-up   Depression   Insomnia   Medication Refill   HPI: Renee Stephens is a 68 year old Caucasian female, married, currently retired, on [employer], lives in [place] with her husband, has a history of multiple medical problems including MDD in partial remission, insomnia, restless leg symptoms, tremors, history of MI, hysterectomy, history of left-sided elbow fracture with surgery, presented for a follow-up appointment.  The patient presents with a chief complaint of severe insomnia. She reports an inability to sleep for up to three days at a time, a problem that has been ongoing for approximately three to four months. The patient has been self-medicating with over-the-counter Benadryl, taking two tablets at a time, but this has not been effective.  Patient was prescribed hydroxyzine 50 mg today by primary care provider she is planning to hold it for now.  The patient is currently on multiple medications, including ropinirole, gabapentin, zonisamide, fluoxetine, and bupropion. She has been taking zonisamide for less than a year but more than six months, and fluoxetine since around 2010. The patient has been taking both of these medications at night, and it is noted that both of these medications can potentially cause insomnia. The patient has also been taking bupropion, which can also cause sleep issues.  In addition to the insomnia, the patient reports having chest pains and shortness of breath. She has a known history of two cardiac blockages. Due to these symptoms, the patient has been referred for a PET scan by her cardiologist.  The patient has also been attempting to wean herself off Buspar, a medication for anxiety, by cutting the tablets in half. She has not yet completely discontinued this medication.  The patient's sleep hygiene is  also of concern. She reports getting up in the middle of the night and watching TV when she cannot sleep, a habit that can potentially exacerbate insomnia. She does not consume caffeinated drinks or soda, and her last meal of the evening is usually accompanied by water or decaffeinated tea.  Patient does have a history of tremors currently managed on primidone, zonisamide.  Patient reports improvement of tremors on this combination of medication.  In summary, the patient is dealing with severe insomnia, potentially exacerbated by her current medication regimen and poor sleep hygiene. She also has ongoing cardiac issues that are currently being investigated. The patient's mental health conditions are being managed with multiple medications, some of which she is attempting to reduce or discontinue.  Patient currently denies any suicidality, homicidality or perceptual disturbances.    Visit Diagnosis:    ICD-10-CM   1. Recurrent major depressive disorder, in partial remission (HCC)  F33.41 FLUoxetine (PROZAC) 40 MG capsule    buPROPion (WELLBUTRIN) 100 MG tablet    2. Insomnia, unspecified type  G47.00 eszopiclone (LUNESTA) 1 MG TABS tablet      Past Psychiatric History: I have reviewed past psychiatric history from progress note on 08/16/2023.  Past Medical History:  Past Medical History:  Diagnosis Date   Anxiety    Coronary artery disease    Myocardial infarction [facility])     Past Surgical History:  Procedure Laterality Date   ABDOMINAL HYSTERECTOMY  1995   CARDIAC CATHETERIZATION     COLONOSCOPY N/A 06/17/2020   Procedure: COLONOSCOPY;  Surgeon: Toledo, Boykin Nearing, MD;  Location: [facility] ENDOSCOPY;  Service: Gastroenterology;  Laterality:  N/A;   CORONARY STENT INTERVENTION N/A 12/28/2018   Procedure: CORONARY STENT INTERVENTION;  Surgeon: Alwyn Pea, MD;  Location: [facility] INVASIVE CV LAB;  Service: Cardiovascular;  Laterality: N/A;   LEFT HEART CATH AND CORONARY ANGIOGRAPHY Left  12/28/2018   Procedure: LEFT HEART CATH AND CORONARY ANGIOGRAPHY;  Surgeon: Alwyn Pea, MD;  Location: [facility] INVASIVE CV LAB;  Service: Cardiovascular;  Laterality: Left;   LEFT HEART CATH AND CORONARY ANGIOGRAPHY N/A 11/01/2019   Procedure: LEFT HEART CATH AND CORONARY ANGIOGRAPHY;  Surgeon: Alwyn Pea, MD;  Location: [facility] INVASIVE CV LAB;  Service: Cardiovascular;  Laterality: N/A;   LEFT HEART CATH AND CORONARY ANGIOGRAPHY N/A 07/30/2021   Procedure: LEFT HEART CATH AND CORONARY ANGIOGRAPHY;  Surgeon: Armando Reichert, MD;  Location: [facility] INVASIVE CV LAB;  Service: Cardiovascular;  Laterality: N/A;    Family Psychiatric History: I have reviewed family psychiatric history from progress note on 08/16/2023.  Family History:  Family History  Problem Relation Age of Onset   Kidney failure Mother    Mental illness Mother    Kidney failure Father    Breast cancer Neg Hx    Bladder Cancer Neg Hx     Social History: I have reviewed social history from progress note on 08/16/2023. Social History   Socioeconomic History   Marital status: Married    Spouse name: Not on file   Number of children: Not on file   Years of education: Not on file   Highest education level: Some college, no degree  Occupational History   Occupation: [employer]  Tobacco Use   Smoking status: Never   Smokeless tobacco: Never  Vaping Use   Vaping status: Never Used  Substance and Sexual Activity   Alcohol use: No   Drug use: No   Sexual activity: Yes  Other Topics Concern   Not on file  Social History Narrative   Not on file   Social Drivers of Health   Financial Resource Strain: Low Risk  (09/09/2023)   Overall Financial Resource Strain (CARDIA)    Difficulty of Paying Living Expenses: Not hard at all  Food Insecurity: No Food Insecurity (09/09/2023)   Hunger Vital Sign    Worried About Running Out of Food in the Last Year: Never true    Ran Out of Food in the Last Year: Never true   Transportation Needs: No Transportation Needs (09/09/2023)   PRAPARE - Administrator, Civil Service (Medical): No    Lack of Transportation (Non-Medical): No  Physical Activity: Insufficiently Active (09/09/2023)   Exercise Vital Sign    Days of Exercise per Week: 4 days    Minutes of Exercise per Session: 30 min  Stress: Stress Concern Present (09/09/2023)   [employer] of Occupational Health - Occupational Stress Questionnaire    Feeling of Stress : To some extent  Social Connections: Moderately Isolated (09/09/2023)   Social Connection and Isolation Panel [NHANES]    Frequency of Communication with Friends and Family: Twice a week    Frequency of Social Gatherings with Friends and Family: Once a week    Attends Religious Services: Never    Database administrator or Organizations: No    Attends Banker Meetings: Never    Marital Status: Married    Allergies:  Allergies  Allergen Reactions   Penicillin G Swelling and Rash    Did it involve swelling of the face/tongue/throat, SOB, or low BP? Yes Did it involve sudden  or severe rash/hives, skin peeling, or any reaction on the inside of your mouth or nose? No Did you need to seek medical attention at a hospital or doctor's office? Yes When did it last happen?      childhood allergy If all above answers are "NO", may proceed with cephalosporin use.     Metabolic Disorder Labs: No results found for: "HGBA1C", "MPG" No results found for: "PROLACTIN" No results found for: "CHOL", "TRIG", "HDL", "CHOLHDL", "VLDL", "LDLCALC" No results found for: "TSH"  Therapeutic Level Labs: No results found for: "LITHIUM" No results found for: "VALPROATE" No results found for: "CBMZ"  Current Medications: Current Outpatient Medications  Medication Sig Dispense Refill   acetaminophen (TYLENOL) 500 MG tablet Take 500 mg by mouth every 8 (eight) hours as needed for moderate pain or headache.     ARIPiprazole (ABILIFY)  2 MG tablet Take 2 mg by mouth daily.     buPROPion (WELLBUTRIN) 100 MG tablet Take 1 tablet (100 mg total) by mouth in the morning. Stop Bupropion XL 150 MG 30 tablet 1   busPIRone (BUSPAR) 10 MG tablet Take 10 mg by mouth 2 (two) times daily.     carvedilol (COREG) 3.125 MG tablet Take 3.125 mg by mouth 2 (two) times daily.     clopidogrel (PLAVIX) 75 MG tablet Take 75 mg by mouth daily.     cyclobenzaprine (FLEXERIL) 10 MG tablet Take 10 mg by mouth at bedtime.     diltiazem (CARDIZEM CD) 180 MG 24 hr capsule Take 180 mg by mouth daily.     eszopiclone (LUNESTA) 1 MG TABS tablet Take 1 tablet (1 mg total) by mouth at bedtime as needed for sleep. Take immediately before bedtime 30 tablet 0   fluticasone-salmeterol (ADVAIR HFA) 230-21 MCG/ACT inhaler as needed.     furosemide (LASIX) 20 MG tablet Take by mouth as needed.     gabapentin (NEURONTIN) 100 MG capsule Take 100 mg by mouth daily at 6 (six) AM.     hydrOXYzine (ATARAX) 50 MG tablet Take 1 tablet by mouth at bedtime.     isosorbide mononitrate (IMDUR) 30 MG 24 hr tablet Take 30 mg by mouth daily.     metoprolol succinate (TOPROL-XL) 25 MG 24 hr tablet once.     montelukast (SINGULAIR) 10 MG tablet Take 10 mg by mouth daily.     omeprazole (PRILOSEC) 20 MG capsule Take 20 mg by mouth daily.     pantoprazole (PROTONIX) 40 MG tablet once.     primidone (MYSOLINE) 50 MG tablet Take 50 mg by mouth daily.     propranolol (INDERAL) 40 MG tablet once.     rOPINIRole (REQUIP XL) 4 MG 24 hr tablet Take 4 mg by mouth at bedtime.     rOPINIRole (REQUIP) 1 MG tablet Take 2 mg by mouth See admin instructions. Take 2 mg in the evening and 2 mg at bedtime     rosuvastatin (CRESTOR) 40 MG tablet Take 1 tablet (40 mg total) by mouth daily at 6 PM. 30 tablet 12   Tdap (BOOSTRIX) 5-2.5-18.5 LF-MCG/0.5 injection      zonisamide (ZONEGRAN) 100 MG capsule Take 100 mg by mouth 2 (two) times daily.     Zoster Vaccine Adjuvanted [facility]) injection       FLUoxetine (PROZAC) 40 MG capsule Take 1 capsule (40 mg total) by mouth daily with breakfast. 30 capsule 1   No current facility-administered medications for this visit.     Musculoskeletal: Strength &  Muscle Tone: within normal limits Gait & Station: normal Patient leans: N/A  Psychiatric Specialty Exam: Review of Systems  Neurological:  Positive for tremors.  Psychiatric/Behavioral:  Positive for sleep disturbance.     Blood pressure 125/74, pulse 69, temperature (!) 96.1 F (35.6 C), temperature source Skin, height 5\' 6"  (1.676 m), weight 178 lb (80.7 kg).Body mass index is 28.73 kg/m.  General Appearance: Casual  Eye Contact:  Good  Speech:  Normal Rate  Volume:  Normal  Mood:  Euthymic  Affect:  Appropriate  Thought Process:  Goal Directed and Descriptions of Associations: Intact  Orientation:  Full (Time, Place, and Person)  Thought Content: Logical   Suicidal Thoughts:  No  Homicidal Thoughts:  No  Memory:  Immediate;   Fair Recent;   Fair Remote;   Fair  Judgement:  Fair  Insight:  Fair  Psychomotor Activity:  Tremor  Concentration:  Concentration: Fair and Attention Span: Fair  Recall:  [employer] of Knowledge: Fair  Language: Fair  Akathisia:  No  Handed:  Right  AIMS (if indicated): patient has tremors, chronic  Assets:  Communication Skills Desire for Improvement Housing Social Support Talents/Skills Transportation  ADL's:  Intact  Cognition: WNL  Sleep:  Poor   Screenings: Geneticist, molecular Office Visit from 08/16/2023 in [facility]  AIMS Total Score 0      GAD-7    Flowsheet Row Office Visit from 09/27/2023 in [facility] Counselor from 09/09/2023 in [facility] Office Visit from 08/16/2023 in [facility]  Total GAD-7 Score 5 9 5       PHQ2-9    Flowsheet Row Office Visit  from 09/27/2023 in [facility] Counselor from 09/09/2023 in [facility] Office Visit from 08/16/2023 in [facility]  PHQ-2 Total Score 2 2 2   PHQ-9 Total Score 9 9 6       Flowsheet Row Office Visit from 09/27/2023 in [facility] Counselor from 09/09/2023 in [facility] Office Visit from 08/16/2023 in [facility]  C-SSRS RISK CATEGORY No Risk No Risk No Risk        Assessment and Plan: Lamya Lausch is a 68 year old Caucasian female who has a history of depression, currently struggling with insomnia, discussed assessment and plan as noted below.  Insomnia-unstable Chronic insomnia for 3-4 months, characterized by inability to sleep for up to three days. Currently using over-the-counter Benadryl, which is not recommended due to potential side effects (memory issues, falls, constipation, dry mouth). Medications such as zonisamide and fluoxetine may contribute to sleep disturbances. Discussed sleep hygiene and potential side effects of sleep medications, including Lunesta (confusion, falls, sleepwalking, hallucinations). Emphasized need for family monitoring and securing environment to prevent sleepwalking. Noted 6% risk of insomnia with zonisamide. - Start Lunesta 1 mg for sleep - Reviewed [place] PMP AWARxE - Educate on sleep hygiene: avoid TV and electronic devices, engage in calming activities like reading or coloring - Take zonisamide earlier in the day - Take fluoxetine in the morning or with supper - Reduce bupropion XL to 100 mg immediate release in the morning - Discontinue Buspar - Patient with restless leg symptoms-well-managed on propranolol as prescribed by primary care provider.  Depression in partial remission Long-standing depression  managed with fluoxetine, bupropion, and  aripiprazole. Adjustments made to address potential side effects impacting sleep. - Reduce bupropion XL to 100 mg immediate release in the morning - Take fluoxetine 40 mg in the morning or with supper - Continue aripiprazole 2 mg in the morning ( 08/1823 EKG - QTC - 424-patient currently under the care of cardiology.) - Discontinue Buspar    Follow-up - Follow up in 4 weeks - Bring all medications to the next appointment - Send MyChart message with current medication list and dosages.   Collaboration of Care: Collaboration of Care: Referral or follow-up with counselor/therapist AEB patient encouraged to continue CBT  Patient/Guardian was advised Release of Information must be obtained prior to any record release in order to collaborate their care with an outside provider. Patient/Guardian was advised if they have not already done so to contact the registration department to sign all necessary forms in order for [place] to release information regarding their care.   Consent: Patient/Guardian gives verbal consent for treatment and assignment of benefits for services provided during this visit. Patient/Guardian expressed understanding and agreed to proceed.   This note was generated in part or whole with voice recognition software. Voice recognition is usually quite accurate but there are transcription errors that can and very often do occur. I apologize for any typographical errors that were not detected and corrected.    Jomarie Longs, MD 09/27/2023, 4:42 PM

## 2023-09-28 DIAGNOSIS — R200 Anesthesia of skin: Secondary | ICD-10-CM | POA: Diagnosis not present

## 2023-09-28 DIAGNOSIS — E538 Deficiency of other specified B group vitamins: Secondary | ICD-10-CM | POA: Diagnosis not present

## 2023-09-28 DIAGNOSIS — G629 Polyneuropathy, unspecified: Secondary | ICD-10-CM | POA: Diagnosis not present

## 2023-09-30 DIAGNOSIS — M25622 Stiffness of left elbow, not elsewhere classified: Secondary | ICD-10-CM | POA: Diagnosis not present

## 2023-10-06 ENCOUNTER — Telehealth (HOSPITAL_COMMUNITY): Payer: Self-pay | Admitting: *Deleted

## 2023-10-06 ENCOUNTER — Encounter (HOSPITAL_COMMUNITY): Payer: Self-pay

## 2023-10-06 ENCOUNTER — Encounter: Payer: Self-pay | Admitting: Internal Medicine

## 2023-10-06 DIAGNOSIS — I2510 Atherosclerotic heart disease of native coronary artery without angina pectoris: Secondary | ICD-10-CM

## 2023-10-06 DIAGNOSIS — E538 Deficiency of other specified B group vitamins: Secondary | ICD-10-CM | POA: Diagnosis not present

## 2023-10-06 NOTE — Telephone Encounter (Signed)
Reaching out to patient to offer assistance regarding upcoming cardiac imaging study; pt verbalizes understanding of appt date/time, parking situation and where to check in, pre-test NPO status, and verified current allergies; name and call back number provided for further questions should they arise  Aailyah Dunbar RN Navigator Cardiac Imaging [facility] [phone] office [phone] cell  Patient aware to avoid caffeine 12 hours prior to her cardiac PET scan.  

## 2023-10-07 ENCOUNTER — Ambulatory Visit
Admission: RE | Admit: 2023-10-07 | Discharge: 2023-10-07 | Disposition: A | Discharge status: Home / Self Care | Payer: Medicare HMO | Source: Ambulatory Visit | Attending: Internal Medicine | Admitting: Internal Medicine

## 2023-10-07 DIAGNOSIS — Z955 Presence of coronary angioplasty implant and graft: Secondary | ICD-10-CM | POA: Diagnosis not present

## 2023-10-07 DIAGNOSIS — I25110 Atherosclerotic heart disease of native coronary artery with unstable angina pectoris: Secondary | ICD-10-CM | POA: Diagnosis not present

## 2023-10-07 DIAGNOSIS — I219 Acute myocardial infarction, unspecified: Secondary | ICD-10-CM | POA: Diagnosis not present

## 2023-10-07 MED ORDER — RUBIDIUM RB82 GENERATOR (RUBYFILL)
25.0000 | PACK | Freq: Once | INTRAVENOUS | Status: AC
  Administered 2023-10-07: 20.86 via INTRAVENOUS

## 2023-10-07 MED ORDER — RUBIDIUM RB82 GENERATOR (RUBYFILL)
25.0000 | PACK | Freq: Once | INTRAVENOUS | Status: AC
  Administered 2023-10-07: 20.87 via INTRAVENOUS

## 2023-10-07 MED ORDER — REGADENOSON 0.4 MG/5ML IV SOLN
INTRAVENOUS | Status: AC
  Filled 2023-10-07: qty 5

## 2023-10-07 MED ORDER — REGADENOSON 0.4 MG/5ML IV SOLN
0.4000 mg | Freq: Once | INTRAVENOUS | Status: AC
  Administered 2023-10-07: 0.4 mg via INTRAVENOUS
  Filled 2023-10-07: qty 5

## 2023-10-07 NOTE — Progress Notes (Signed)
 Patient presents for a cardiac PET stress test and tolerated procedure without incident. Patient maintained acceptable vital signs throughout the test and was offered caffeine after test.  Patient ambulated out of department with a steady gait.

## 2023-10-08 LAB — NM PET CT CARDIAC PERFUSION MULTI W/ABSOLUTE BLOODFLOW
LV dias vol: 66 mL (ref 46–106)
LV sys vol: 21 mL
MBFR: 2.84
Nuc Rest EF: 63 %
Nuc Stress EF: 68 %
Peak HR: 71 {beats}/min
Rest HR: 53 {beats}/min
Rest MBF: 0.62 ml/g/min
Rest Nuclear Isotope Dose: 20.9 mCi
SRS: 0
SSS: 0
ST Depression (mm): 0 mm
Stress MBF: 1.76 ml/g/min
Stress Nuclear Isotope Dose: 20.9 mCi
TID: 1.09

## 2023-10-11 ENCOUNTER — Ambulatory Visit (INDEPENDENT_AMBULATORY_CARE_PROVIDER_SITE_OTHER): Payer: Medicare HMO | Admitting: Professional Counselor

## 2023-10-11 DIAGNOSIS — F3341 Major depressive disorder, recurrent, in partial remission: Secondary | ICD-10-CM

## 2023-10-11 NOTE — Progress Notes (Signed)
  THERAPIST PROGRESS NOTE  Session Time: 2:00 PM - 2:56 PM  Participation Level: Active  Behavioral Response: Well Groomed, Alert, Euthymic  Type of Therapy: Individual Therapy  Treatment Goals addressed: OP Active Depression LTG (Revised): "I want to take better care of myself.  Stop complaining. Find a feeling of purpose. Get to know myself. Make positive changes in myself."   Start:  09/23/23   Expected End:  09/21/24    Goal: STG: "Improving my sleep." To improve sleep hygiene AEB implementing a nighttime routine and coping mechanisms over the next 12 weeks.  Goal: STG: "I want to work on improving my friendships. (Also marriage)" To improve relationship satisfaction AEB increasing socialization and use of interpersonal effectiveness skills.  ProgressTowards Goals: Progressing  Summary: Renee Stephens is a 68 y.o. female who presents with recurrent MDD. She appeared alert and oriented x5. She was neatly dressed and well-groomed. She stated things are going well. She brought her notebook today with some additional goals too add to her treatment plan. Renee Stephens engaged in discussion about goals and asked about how to manage stress. She was receptive to coping mechanisms and in agreement to practice skills between now and next session.   Therapist Response: Conducted session with Renee Stephens. Began session with check-in/update since previous session. Utilized empathetic and reflective listening. Revised tx plan with additional goals Any would like to work on. Explained breathing exercises and engaged Renee Stephens in 5-4-3-2-1 grounding mechanism. Provided psychoeducation and encouraged consistent practice. Explored options to engage in additional activities for stress reduction such as  yoga/library activities. Confirmed next session and concluded session.   Suicidal/Homicidal: No  Plan: Return again in 2 weeks  Diagnosis: Recurrent major depressive disorder, in partial remission  (HCC)  Collaboration of Care: Medication Management AEB chart review  Patient/Guardian was advised Release of Information must be obtained prior to any record release in order to collaborate their care with an outside provider. Patient/Guardian was advised if they have not already done so to contact the registration department to sign all necessary forms in order for [place] to release information regarding their care.   Consent: Patient/Guardian gives verbal consent for treatment and assignment of benefits for services provided during this visit. Patient/Guardian expressed understanding and agreed to proceed.   Edmonia Lynch, [facility] 10/11/2023

## 2023-10-13 DIAGNOSIS — E538 Deficiency of other specified B group vitamins: Secondary | ICD-10-CM | POA: Diagnosis not present

## 2023-10-14 DIAGNOSIS — I25110 Atherosclerotic heart disease of native coronary artery with unstable angina pectoris: Secondary | ICD-10-CM | POA: Diagnosis not present

## 2023-10-14 DIAGNOSIS — G4733 Obstructive sleep apnea (adult) (pediatric): Secondary | ICD-10-CM | POA: Diagnosis not present

## 2023-10-14 DIAGNOSIS — Z955 Presence of coronary angioplasty implant and graft: Secondary | ICD-10-CM | POA: Diagnosis not present

## 2023-10-14 DIAGNOSIS — E782 Mixed hyperlipidemia: Secondary | ICD-10-CM | POA: Diagnosis not present

## 2023-10-19 DIAGNOSIS — E538 Deficiency of other specified B group vitamins: Secondary | ICD-10-CM | POA: Diagnosis not present

## 2023-10-25 ENCOUNTER — Ambulatory Visit (INDEPENDENT_AMBULATORY_CARE_PROVIDER_SITE_OTHER): Payer: Medicare HMO | Admitting: Professional Counselor

## 2023-10-25 DIAGNOSIS — F439 Reaction to severe stress, unspecified: Secondary | ICD-10-CM

## 2023-10-25 NOTE — Progress Notes (Signed)
  THERAPIST PROGRESS NOTE  Session Time: 2:00 PM - 2:58 PM  Participation Level: Active  Behavioral Response: Well Groomed, Alert, Dysphoric  Type of Therapy: Individual Therapy  Treatment Goals addressed: OP Active Depression LTG (Revised): "I want to take better care of myself.  Stop complaining. Find a feeling of purpose. Get to know myself. Make positive changes in myself."               Start:  09/23/23   Expected End:  09/21/24     Goal: STG: "Improving my sleep." To improve sleep hygiene AEB implementing a nighttime routine and coping mechanisms over the next 12 weeks.   Goal: STG: "I want to work on improving my friendships. (Also marriage)" To improve relationship satisfaction AEB increasing socialization and use of interpersonal effectiveness skills.  ProgressTowards Goals: Progressing  Interventions: CBT  Summary: Renee Stephens is a 68 y.o. female who presents with a history of recurrent depression. She appeared alert and oriented x5. She was neatly dressed and well-groomed. She reported their water heater broke this weekend and that was stressful to deal with. She was happy to note her sleep has improved and she has found breathing exercises to be helpful. [name] engaged in writing exercise, identifying things within and outside of her control. She was receptive to feedback from Conservation officer, nature. She shared traumatic event from her past during session. [name] actively listened to psychoeducation and noted she thought PTSD was reserved for people who had been at war. She scored 60 on PCL screening. She was receptive to cognitive processing therapy and homework assignment. She stated, "today has been enlightening."   Therapist Response: Conducted session with [name]. Began session with check-in/update since previous session. Utilized empathetic and reflective listening. Engaged [place] in writing exercise - donut/circles of control. Assisted with identifying things within and  outside of her control. Actively listened as [name] shared traumatic event from her past. Provided psychoeducation on PTSD and administered PCL screening. Discussed cognitive processing therapy and explained homework assignment - impact statement. Scheduled additional appointment and concluded session.   Suicidal/Homicidal: No   Plan: Return again in 2 weeks.  Diagnosis: Trauma and stressor-related disorder  Collaboration of Care: Medication Management AEB chart review  Patient/Guardian was advised Release of Information must be obtained prior to any record release in order to collaborate their care with an outside provider. Patient/Guardian was advised if they have not already done so to contact the registration department to sign all necessary forms in order for [place] to release information regarding their care.   Consent: Patient/Guardian gives verbal consent for treatment and assignment of benefits for services provided during this visit. Patient/Guardian expressed understanding and agreed to proceed.   Edmonia Lynch, [facility] 10/25/2023

## 2023-11-01 NOTE — Progress Notes (Unsigned)
 BH MD OP Progress Note  11/03/2023 12:08 PM Renee Stephens  MRN:  [id number]  Chief Complaint:  Chief Complaint  Patient presents with   Follow-up   Depression   Medication Refill   Insomnia   HPI: Renee Stephens is a 68 year old Caucasian female, married, currently retired, on [employer], lives in [place] with her husband, has a history of multiple medical problems including MDD in partial remission, insomnia, restless leg symptoms, tremors, history of MI, hysterectomy, history of left-sided elbow fracture with surgery, presented for a follow-up appointment.  She has a history of depression and insomnia. Her depression is improving, but she continues to struggle with insomnia. Sleep is described as 'better', yet she still wakes up at 3:30 AM on some nights. On average, she gets about six hours of sleep on a good night, with a 'fifty, fifty ' of having a good night's sleep over the past two weeks. She is currently taking Lunesta, which she finds helpful, and is on a lower dosage of Wellbutrin (bupropion). She has stopped taking Buspar and continues with Abilify (aripiprazole) 2 mg in the morning and fluoxetine 40 mg. She has not tried melatonin in conjunction with her current medications.  In terms of her mental health, she denies any suicidal thoughts or attempts and has not had thoughts of harming others recently. She reports a good appetite, although she wishes it wasn't 'quite so good'. She weighs 176 pounds.  She experiences restless leg symptoms, which are controlled with medication. She also reports tremors, which she describes as 'terrible'. She is taking gabapentin, which she believes may help with the tremors. She mentions that she is being evaluated for Parkinson's disease by Dr. Sherryll Burger.  She has a history of cardiac issues, including two past blockages. She underwent a PET scan, which returned normal results, and her ejection fraction is reported to be 68%.   She currently  denies any suicidality, homicidality or perceptual disturbances.  She is motivated to stay in psychotherapy and has upcoming appointments.  Visit Diagnosis:    ICD-10-CM   1. Recurrent major depressive disorder, in full remission (HCC)  F33.42     2. Insomnia, unspecified type  G47.00 eszopiclone (LUNESTA) 1 MG TABS tablet      Past Psychiatric History: I have reviewed past psychiatric history from progress note on 08/16/2023.  Past Medical History:  Past Medical History:  Diagnosis Date   Anxiety    Coronary artery disease    Myocardial infarction [facility])     Past Surgical History:  Procedure Laterality Date   ABDOMINAL HYSTERECTOMY  1995   CARDIAC CATHETERIZATION     COLONOSCOPY N/A 06/17/2020   Procedure: COLONOSCOPY;  Surgeon: Toledo, Boykin Nearing, MD;  Location: [facility] ENDOSCOPY;  Service: Gastroenterology;  Laterality: N/A;   CORONARY STENT INTERVENTION N/A 12/28/2018   Procedure: CORONARY STENT INTERVENTION;  Surgeon: Alwyn Pea, MD;  Location: [facility] INVASIVE CV LAB;  Service: Cardiovascular;  Laterality: N/A;   LEFT HEART CATH AND CORONARY ANGIOGRAPHY Left 12/28/2018   Procedure: LEFT HEART CATH AND CORONARY ANGIOGRAPHY;  Surgeon: Alwyn Pea, MD;  Location: [facility] INVASIVE CV LAB;  Service: Cardiovascular;  Laterality: Left;   LEFT HEART CATH AND CORONARY ANGIOGRAPHY N/A 11/01/2019   Procedure: LEFT HEART CATH AND CORONARY ANGIOGRAPHY;  Surgeon: Alwyn Pea, MD;  Location: [facility] INVASIVE CV LAB;  Service: Cardiovascular;  Laterality: N/A;   LEFT HEART CATH AND CORONARY ANGIOGRAPHY N/A 07/30/2021   Procedure: LEFT HEART CATH AND CORONARY ANGIOGRAPHY;  Surgeon: Armando Reichert, MD;  Location: [facility] INVASIVE CV LAB;  Service: Cardiovascular;  Laterality: N/A;    Family Psychiatric History: I have reviewed family psychiatric history from progress note on 08/16/2023.  Family History:  Family History  Problem Relation Age of Onset   Kidney failure Mother     Mental illness Mother    Kidney failure Father    Breast cancer Neg Hx    Bladder Cancer Neg Hx     Social History: I have reviewed social history from progress note on 08/16/2023. Social History   Socioeconomic History   Marital status: Married    Spouse name: Not on file   Number of children: Not on file   Years of education: Not on file   Highest education level: Some college, no degree  Occupational History   Occupation: [employer]  Tobacco Use   Smoking status: Never   Smokeless tobacco: Never  Vaping Use   Vaping status: Never Used  Substance and Sexual Activity   Alcohol use: No   Drug use: No   Sexual activity: Yes  Other Topics Concern   Not on file  Social History Narrative   Not on file   Social Drivers of Health   Financial Resource Strain: Low Risk  (09/09/2023)   Overall Financial Resource Strain (CARDIA)    Difficulty of Paying Living Expenses: Not hard at all  Food Insecurity: No Food Insecurity (09/09/2023)   Hunger Vital Sign    Worried About Running Out of Food in the Last Year: Never true    Ran Out of Food in the Last Year: Never true  Transportation Needs: No Transportation Needs (09/09/2023)   PRAPARE - Administrator, Civil Service (Medical): No    Lack of Transportation (Non-Medical): No  Physical Activity: Insufficiently Active (09/09/2023)   Exercise Vital Sign    Days of Exercise per Week: 4 days    Minutes of Exercise per Session: 30 min  Stress: Stress Concern Present (09/09/2023)   [employer] of Occupational Health - Occupational Stress Questionnaire    Feeling of Stress : To some extent  Social Connections: Moderately Isolated (09/09/2023)   Social Connection and Isolation Panel [NHANES]    Frequency of Communication with Friends and Family: Twice a week    Frequency of Social Gatherings with Friends and Family: Once a week    Attends Religious Services: Never    Database administrator or Organizations: No    Attends Tax inspector Meetings: Never    Marital Status: Married    Allergies:  Allergies  Allergen Reactions   Penicillin G Swelling and Rash    Did it involve swelling of the face/tongue/throat, SOB, or low BP? Yes Did it involve sudden or severe rash/hives, skin peeling, or any reaction on the inside of your mouth or nose? No Did you need to seek medical attention at a hospital or doctor's office? Yes When did it last happen?      childhood allergy If all above answers are "NO", may proceed with cephalosporin use.     Metabolic Disorder Labs: No results found for: "HGBA1C", "MPG" No results found for: "PROLACTIN" No results found for: "CHOL", "TRIG", "HDL", "CHOLHDL", "VLDL", "LDLCALC" No results found for: "TSH"  Therapeutic Level Labs: No results found for: "LITHIUM" No results found for: "VALPROATE" No results found for: "CBMZ"  Current Medications: Current Outpatient Medications  Medication Sig Dispense Refill   ARIPiprazole (ABILIFY) 2 MG tablet Take 2  mg by mouth daily.     busPIRone (BUSPAR) 10 MG tablet Take 10 mg by mouth 2 (two) times daily.     carvedilol (COREG) 3.125 MG tablet Take 3.125 mg by mouth 2 (two) times daily.     FLUoxetine (PROZAC) 40 MG capsule Take 1 capsule (40 mg total) by mouth daily with breakfast. 30 capsule 1   fluticasone-salmeterol (ADVAIR HFA) 230-21 MCG/ACT inhaler as needed.     furosemide (LASIX) 20 MG tablet Take by mouth as needed.     gabapentin (NEURONTIN) 100 MG capsule Take 100 mg by mouth daily at 6 (six) AM.     hydrOXYzine (ATARAX) 50 MG tablet Take 1 tablet by mouth at bedtime.     montelukast (SINGULAIR) 10 MG tablet Take 10 mg by mouth daily.     omeprazole (PRILOSEC) 20 MG capsule Take 20 mg by mouth daily.     pantoprazole (PROTONIX) 40 MG tablet once.     propranolol (INDERAL) 40 MG tablet once.     rOPINIRole (REQUIP XL) 4 MG 24 hr tablet Take 4 mg by mouth at bedtime.     rOPINIRole (REQUIP) 1 MG tablet Take 2 mg by mouth  See admin instructions. Take 2 mg in the evening and 2 mg at bedtime     rosuvastatin (CRESTOR) 40 MG tablet Take 1 tablet (40 mg total) by mouth daily at 6 PM. 30 tablet 12   zonisamide (ZONEGRAN) 100 MG capsule Take 100 mg by mouth 2 (two) times daily.     acetaminophen (TYLENOL) 500 MG tablet Take 500 mg by mouth every 8 (eight) hours as needed for moderate pain or headache.     clopidogrel (PLAVIX) 75 MG tablet Take 75 mg by mouth daily.     cyclobenzaprine (FLEXERIL) 10 MG tablet Take 10 mg by mouth at bedtime.     diltiazem (CARDIZEM CD) 180 MG 24 hr capsule Take 180 mg by mouth daily.     eszopiclone (LUNESTA) 1 MG TABS tablet Take 1 tablet (1 mg total) by mouth at bedtime as needed for sleep. Take immediately before bedtime 30 tablet 2   primidone (MYSOLINE) 50 MG tablet Take 50 mg by mouth daily. (Patient not taking: Reported on 11/03/2023)     Tdap (BOOSTRIX) 5-2.5-18.5 LF-MCG/0.5 injection  (Patient not taking: Reported on 11/03/2023)     Zoster Vaccine Adjuvanted [facility]) injection      No current facility-administered medications for this visit.     Musculoskeletal: Strength & Muscle Tone: within normal limits Gait & Station: normal Patient leans: N/A  Psychiatric Specialty Exam: Review of Systems  Neurological:  Positive for tremors.  Psychiatric/Behavioral:  Positive for sleep disturbance.     Blood pressure 118/78, pulse 67, temperature 97.8 F (36.6 C), temperature source Temporal, height 5\' 6"  (1.676 m), weight 176 lb 12.8 oz (80.2 kg), SpO2 96%.Body mass index is 28.54 kg/m.  General Appearance: Fairly Groomed  Eye Contact:  Good  Speech:  Clear and Coherent  Volume:  Normal  Mood:  Euthymic  Affect:  Appropriate  Thought Process:  Goal Directed and Descriptions of Associations: Intact  Orientation:  Full (Time, Place, and Person)  Thought Content: Logical   Suicidal Thoughts:  No  Homicidal Thoughts:  No  Memory:  Immediate;   Fair Recent;   Fair Remote;    Fair  Judgement:  Fair  Insight:  Fair  Psychomotor Activity:  Tremor BL upper extremities  Concentration:  Concentration: Fair and Attention Span: Fair  Recall:  Jennelle Human of Knowledge: Fair  Language: Fair  Akathisia:  No  Handed:  Right  AIMS (if indicated): done  Assets:  Desire for Improvement Housing Talents/Skills Transportation  ADL's:  Intact  Cognition: WNL  Sleep:   improving   Screenings: Geneticist, molecular Office Visit from 11/03/2023 in [facility] Office Visit from 08/16/2023 in [facility]  AIMS Total Score 0 0      GAD-7    Flowsheet Row Office Visit from 11/03/2023 in [facility] Office Visit from 09/27/2023 in [facility] Counselor from 09/09/2023 in [facility] Office Visit from 08/16/2023 in [facility]  Total GAD-7 Score 4 5 9 5       PHQ2-9    Flowsheet Row Office Visit from 11/03/2023 in [facility] Office Visit from 09/27/2023 in [facility] Counselor from 09/09/2023 in [facility] Office Visit from 08/16/2023 in [facility]  PHQ-2 Total Score 2 2 2 2   PHQ-9 Total Score 5 9 9 6       Flowsheet Row Office Visit from 11/03/2023 in [facility] Office Visit from 09/27/2023 in [facility] Counselor from 09/09/2023 in [facility]  C-SSRS RISK CATEGORY No Risk No Risk No Risk        Assessment and Plan: Renee Stephens is a 40 year old Caucasian female who has a history of depression, insomnia, currently presents for follow-up  appointment, discussed assessment and plan as noted below.  Depression in remission Depression is improving with current medication regimen. Denies suicidal ideation and reports stable appetite. - Continue Fluoxetine 40 mg daily - Discontinue continue Wellbutrin , she is noncompliant - Continue Abilify 2 mg daily - Continue BuSpar 10 mg twice daily.  Although discontinued last visit patient reports she is currently taking it.  Insomnia-improving Improvement in sleep with Lunesta, though sleep remains inconsistent with approximately 50% of nights being restful. Currently on Lunesta 1 mg and has not tried melatonin. Discussed importance of sleep hygiene and suggested low-dose melatonin if disturbances persist. Advised against high doses to avoid morning sedation. - Continue Lunesta 1 mg with refills sent to [facility] in [place]. - Recommend Melatonin 3 mg or 5 mg if experiencing frequent disturbances. - Encourage good sleep hygiene practices, such as turning off electronic devices before bedtime and engaging in relaxing activities. - Reviewed  PMP AWARxE   Follow-up Stable on current medications with option for video visit for next appointment. - Schedule follow-up appointment in mid-May, with option for video visit if preferred.  Collaboration of Care: Collaboration of Care: Other patient encouraged to continue to follow up with primary care provider/neurology.  Patient/Guardian was advised Release of Information must be obtained prior to any record release in order to collaborate their care with an outside provider. Patient/Guardian was advised if they have not already done so to contact the registration department to sign all necessary forms in order for [place] to release information regarding their care.   Consent: Patient/Guardian gives verbal consent for treatment and assignment of benefits for services provided during this visit. Patient/Guardian expressed understanding and agreed to  proceed.  Discussed the use of a AI scribe software for clinical note transcription with the patient, who  gave verbal consent to proceed.  This note was generated in part or whole with voice recognition software. Voice recognition is usually quite accurate but there are transcription errors that can and very often do occur. I apologize for any typographical errors that were not detected and corrected.     Jomarie Longs, MD 11/04/2023, 12:41 PM

## 2023-11-03 ENCOUNTER — Encounter: Payer: Self-pay | Admitting: Psychiatry

## 2023-11-03 ENCOUNTER — Ambulatory Visit: Payer: Medicare HMO | Admitting: Psychiatry

## 2023-11-03 VITALS — BP 118/78 | HR 67 | Temp 97.8°F | Ht 66.0 in | Wt 176.8 lb

## 2023-11-03 DIAGNOSIS — F3341 Major depressive disorder, recurrent, in partial remission: Secondary | ICD-10-CM

## 2023-11-03 DIAGNOSIS — F3342 Major depressive disorder, recurrent, in full remission: Secondary | ICD-10-CM

## 2023-11-03 DIAGNOSIS — G4700 Insomnia, unspecified: Secondary | ICD-10-CM

## 2023-11-03 MED ORDER — ESZOPICLONE 1 MG PO TABS: 1.0000 mg | ORAL_TABLET | Freq: Every evening | ORAL | 2 refills | Status: DC | PRN

## 2023-11-08 ENCOUNTER — Ambulatory Visit (INDEPENDENT_AMBULATORY_CARE_PROVIDER_SITE_OTHER): Payer: Medicare HMO | Admitting: Professional Counselor

## 2023-11-08 DIAGNOSIS — F439 Reaction to severe stress, unspecified: Secondary | ICD-10-CM | POA: Diagnosis not present

## 2023-11-08 NOTE — Progress Notes (Signed)
   THERAPIST PROGRESS NOTE  Session Time: 11:00 AM - 12:00 PM   Participation Level: Active  Behavioral Response: Well Groomed, Alert, Dysphoric  Type of Therapy: Individual Therapy  Treatment Goals addressed: OP Active Depression LTG (Revised): "I want to take better care of myself.  Stop complaining. Find a feeling of purpose. Get to know myself. Make positive changes in myself."               Start:  09/23/23   Expected End:  09/21/24     Goal: STG: "Improving my sleep." To improve sleep hygiene AEB implementing a nighttime routine and coping mechanisms over the next 12 weeks.   Goal: STG: "I want to work on improving my friendships. (Also marriage)" To improve relationship satisfaction AEB increasing socialization and use of interpersonal effectiveness skills.  ProgressTowards Goals: Progressing  Interventions: CBT  Summary: Caitlyne Ingham is a 68 y.o. female who presents with a history of depression and trauma. She stated she is doing well. She shared her impact statement. This provided additional information about a fairly recent conversation with her husband about their past, which his response, "made things worse." Bernadette expressed understanding about stuck points and other pyschoeducation. She engaged in completing ABC worksheets during session. She was in agreement to complete additional worksheets for homework assignment. Willo showed a great improvement on her PCL screening, 33 today. She stated writing the impact statement and sharing in session, which she hasn't really shared this information before, has been really helpful.   Therapist Response: Conducted session with [name]. Began session with check-in/update since previous session. Utilized empathetic and reflective listening. Actively listened as Mazi read impact statement and identified some stuck points in statement. Provided psychoeducation on stuck points, natural vs manufactured emotions, and spectrum of  emotions. Explained ABC worksheets and practiced with two situations Markeita identified. Assigned for homework. Administered PCL screening. Scheduled additional appointment and concluded session.   Suicidal/Homicidal: No  Plan: Return again in 3 weeks.  Diagnosis: Trauma and stressor-related disorder  Collaboration of Care: Medication Management AEB chart review  Patient/Guardian was advised Release of Information must be obtained prior to any record release in order to collaborate their care with an outside provider. Patient/Guardian was advised if they have not already done so to contact the registration department to sign all necessary forms in order for [place] to release information regarding their care.   Consent: Patient/Guardian gives verbal consent for treatment and assignment of benefits for services provided during this visit. Patient/Guardian expressed understanding and agreed to proceed.   Edmonia Lynch, [facility] 11/08/2023

## 2023-11-30 ENCOUNTER — Ambulatory Visit: Payer: Medicare HMO | Admitting: Professional Counselor

## 2023-12-06 ENCOUNTER — Other Ambulatory Visit: Payer: Self-pay

## 2023-12-08 ENCOUNTER — Other Ambulatory Visit: Payer: Self-pay | Admitting: Psychiatry

## 2023-12-08 DIAGNOSIS — G4700 Insomnia, unspecified: Secondary | ICD-10-CM

## 2023-12-14 ENCOUNTER — Ambulatory Visit (INDEPENDENT_AMBULATORY_CARE_PROVIDER_SITE_OTHER): Payer: Medicare HMO | Admitting: Professional Counselor

## 2023-12-14 DIAGNOSIS — F439 Reaction to severe stress, unspecified: Secondary | ICD-10-CM | POA: Diagnosis not present

## 2023-12-14 DIAGNOSIS — E538 Deficiency of other specified B group vitamins: Secondary | ICD-10-CM | POA: Diagnosis not present

## 2023-12-14 NOTE — Progress Notes (Signed)
  THERAPIST PROGRESS NOTE  Session Time: 11:00 AM - 12:00 PM  Participation Level: Active  Behavioral Response: Well Groomed, Alert, Euthymic  Type of Therapy: Individual Therapy  Treatment Goals addressed: OP Active Depression LTG (Revised): "I want to take better care of myself.  Stop complaining. Find a feeling of purpose. Get to know myself. Make positive changes in myself."               Start:  09/23/23   Expected End:  09/21/24     Goal: STG: "Improving my sleep." To improve sleep hygiene AEB implementing a nighttime routine and coping mechanisms over the next 12 weeks.   Goal: STG: "I want to work on improving my friendships. (Also marriage)" To improve relationship satisfaction AEB increasing socialization and use of interpersonal effectiveness skills.  ProgressTowards Goals: Progressing  Interventions: CBT, Motivational Interviewing, and Supportive  Summary: Renee Stephens is a 68 y.o. female who presents with a history of depression and trauma. She appeared alert and oriented x5. She stated things are going well. She has shared her trauma account with three other people and noted it seems to be helpful to do so. She scored 36 on PCL screening. She discussed other contributing factors and was receptive to the scores directly related to trauma have declined. [name] shared ABC worksheets she completed for homework. She was receptive to Socratic questioning and psychoeducation on additional cognitive distortions. She was able to restructure thinking patterns into something more helpful. [name] expressed understanding about homework assignment.   Therapist Response: Conducted session with Renee Stephens. Began session with check-in/update since previous session. Utilized empathetic and reflective listening. Administered PCL screening. Engaged in discussion about other contributing factors and normalized fluctuations in scores. Reviewed ABC worksheets. Provided psychoeducation on  cognitive distortions, spectrum of emotions, and used Socratic questioning to help challenge negative thinking. Modeled assertive responses to use with her husband. Scheduled additional appointment and concluded session.   Suicidal/Homicidal: No  Plan: Return again in 2 weeks.  Diagnosis: Trauma and stressor-related disorder  Collaboration of Care: Medication Management AEB chart review  Patient/Guardian was advised Release of Information must be obtained prior to any record release in order to collaborate their care with an outside provider. Patient/Guardian was advised if they have not already done so to contact the registration department to sign all necessary forms in order for us  to release information regarding their care.   Consent: Patient/Guardian gives verbal consent for treatment and assignment of benefits for services provided during this visit. Patient/Guardian expressed understanding and agreed to proceed.   Len Quale, [facility] 12/14/2023

## 2023-12-21 DIAGNOSIS — E7800 Pure hypercholesterolemia, unspecified: Secondary | ICD-10-CM | POA: Diagnosis not present

## 2023-12-21 DIAGNOSIS — R7303 Prediabetes: Secondary | ICD-10-CM | POA: Diagnosis not present

## 2023-12-21 DIAGNOSIS — Z79899 Other long term (current) drug therapy: Secondary | ICD-10-CM | POA: Diagnosis not present

## 2023-12-28 DIAGNOSIS — E785 Hyperlipidemia, unspecified: Secondary | ICD-10-CM | POA: Diagnosis not present

## 2023-12-28 DIAGNOSIS — M25552 Pain in left hip: Secondary | ICD-10-CM | POA: Diagnosis not present

## 2023-12-28 DIAGNOSIS — I2510 Atherosclerotic heart disease of native coronary artery without angina pectoris: Secondary | ICD-10-CM | POA: Diagnosis not present

## 2023-12-28 DIAGNOSIS — G2581 Restless legs syndrome: Secondary | ICD-10-CM | POA: Diagnosis not present

## 2023-12-28 DIAGNOSIS — Z0000 Encounter for general adult medical examination without abnormal findings: Secondary | ICD-10-CM | POA: Diagnosis not present

## 2023-12-28 DIAGNOSIS — R7303 Prediabetes: Secondary | ICD-10-CM | POA: Diagnosis not present

## 2023-12-28 DIAGNOSIS — F419 Anxiety disorder, unspecified: Secondary | ICD-10-CM | POA: Diagnosis not present

## 2023-12-30 ENCOUNTER — Ambulatory Visit (INDEPENDENT_AMBULATORY_CARE_PROVIDER_SITE_OTHER): Admitting: Professional Counselor

## 2023-12-30 DIAGNOSIS — F439 Reaction to severe stress, unspecified: Secondary | ICD-10-CM

## 2023-12-30 NOTE — Progress Notes (Signed)
  THERAPIST PROGRESS NOTE  Session Time: 11:01 AM - 12:00 PM  Participation Level: Active  Behavioral Response: Well Groomed, Alert, Euthymic  Type of Therapy: Individual Therapy  Treatment Goals addressed: OP Active Depression LTG (Revised): "I want to take better care of myself.  Stop complaining. Find a feeling of purpose. Get to know myself. Make positive changes in myself."               Start:  09/23/23   Expected End:  09/21/24     Goal: STG: "Improving my sleep." To improve sleep hygiene AEB implementing a nighttime routine and coping mechanisms over the next 12 weeks.   Goal: STG: "I want to work on improving my friendships. (Also marriage)" To improve relationship satisfaction AEB increasing socialization and use of interpersonal effectiveness skills.  ProgressTowards Goals: Progressing  Interventions: CBT  Summary: Renee Stephens is a 68 y.o. female who presents with a history of depression and trauma. She appeared alert and oriented x5. She stated things are going well. Rosine shared ABC worksheets she completed for homework. She was receptive to feedback on emotions/thoughts. She was receptive to Socratic questioning and was able to restructure her stuck points with assistance. She scored 25 on PCL screening. She expressed understanding about next homework assignment.   Therapist Response: Conducted session with Renee Stephens. Began session with check-in/update since previous session. Utilized empathetic and reflective listening. Provided feedback on ABC worksheets and used Socratic questioning to challenge stuck points. Assisted with restructuring stuck points. Administered PCL screening. 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 Scheduled additional appointment and concluded session.   Suicidal/Homicidal: No  Plan: Return again in 4 weeks.  Diagnosis: Trauma and stressor-related disorder  Collaboration of Care: Medication Management AEB chart  review  Patient/Guardian was advised Release of Information must be obtained prior to any record release in order to collaborate their care with an outside provider. Patient/Guardian was advised if they have not already done so to contact the registration department to sign all necessary forms in order for us  to release information regarding their care.   Consent: Patient/Guardian gives verbal consent for treatment and assignment of benefits for services provided during this visit. Patient/Guardian expressed understanding and agreed to proceed.   Len Quale, [facility] 12/30/2023

## 2023-12-31 DIAGNOSIS — M7062 Trochanteric bursitis, left hip: Secondary | ICD-10-CM | POA: Diagnosis not present

## 2024-01-12 ENCOUNTER — Other Ambulatory Visit: Payer: Self-pay

## 2024-01-12 ENCOUNTER — Encounter: Payer: Self-pay | Admitting: Psychiatry

## 2024-01-12 ENCOUNTER — Ambulatory Visit (INDEPENDENT_AMBULATORY_CARE_PROVIDER_SITE_OTHER): Admitting: Psychiatry

## 2024-01-12 VITALS — BP 108/67 | HR 76 | Temp 96.1°F | Ht 66.0 in | Wt 174.8 lb

## 2024-01-12 DIAGNOSIS — F3342 Major depressive disorder, recurrent, in full remission: Secondary | ICD-10-CM | POA: Diagnosis not present

## 2024-01-12 DIAGNOSIS — F439 Reaction to severe stress, unspecified: Secondary | ICD-10-CM

## 2024-01-12 DIAGNOSIS — G4701 Insomnia due to medical condition: Secondary | ICD-10-CM | POA: Diagnosis not present

## 2024-01-12 DIAGNOSIS — E538 Deficiency of other specified B group vitamins: Secondary | ICD-10-CM | POA: Diagnosis not present

## 2024-01-12 MED ORDER — FLUOXETINE HCL 40 MG PO CAPS: 40.0000 mg | ORAL_CAPSULE | Freq: Every day | ORAL | 1 refills | Status: DC

## 2024-01-12 NOTE — Progress Notes (Signed)
 BH MD OP Progress Note  01/12/2024 12:48 PM Renee Stephens  MRN:  [id number]  Chief Complaint:  Chief Complaint  Patient presents with   Follow-up   Depression   Insomnia   Medication Refill   Discussed the use of AI scribe software for clinical note transcription with the patient, who gave verbal consent to proceed.  History of Present Illness Renee Stephens is a 68 year old Caucasian female married, currently retired, on SSI, lives in [place] with her husband, has a history of depression, trauma and stress related disorder unspecified, insomnia, restless leg syndrome, traumas, history of MI, hysterectomy, was evaluated in office today for a follow-up appointment.  She has experienced significant improvement in her mental health since her last appointment in March, attributed to therapy sessions occurring approximately every two weeks. Her therapist diagnosed her with PTSD related to past infidelities and emotional issues with her partner. Previously preoccupied with these issues, she is now able to focus on other aspects of her life and feels much better.  She is currently taking Fluoxetine  40 mg, Abilify 2 mg, and Buspar 10 mg twice daily for depression and anxiety. She has discontinued Wellbutrin , which she had not been taking regularly, and reports no side effects from Abilify. For sleep, she uses Lunesta  1 mg as needed, along with melatonin, and takes hydroxyzine 50 mg at bedtime.  She reports some tremors and is taking zonisamide for this condition. No jaw clenching or mouth movement issues are present.  She denies any other concerns today.    Visit Diagnosis:    ICD-10-CM   1. Recurrent major depressive disorder, in full remission (HCC)  F33.42 FLUoxetine  (PROZAC ) 40 MG capsule    2. Insomnia due to medical condition  G47.01    Multifactorial including mood, pain    3. Trauma and stressor-related disorder  F43.9    Unspecified      Past Psychiatric  History: I have reviewed past psychiatric history from progress note on 08/16/2023.  Past trials of medications-Wellbutrin   Past Medical History:  Past Medical History:  Diagnosis Date   Anxiety    Coronary artery disease    Myocardial infarction [facility])     Past Surgical History:  Procedure Laterality Date   ABDOMINAL HYSTERECTOMY  1995   CARDIAC CATHETERIZATION     COLONOSCOPY N/A 06/17/2020   Procedure: COLONOSCOPY;  Surgeon: Toledo, Alphonsus Jeans, MD;  Location: [facility] ENDOSCOPY;  Service: Gastroenterology;  Laterality: N/A;   CORONARY STENT INTERVENTION N/A 12/28/2018   Procedure: CORONARY STENT INTERVENTION;  Surgeon: Antonette Batters, MD;  Location: [facility] INVASIVE CV LAB;  Service: Cardiovascular;  Laterality: N/A;   LEFT HEART CATH AND CORONARY ANGIOGRAPHY Left 12/28/2018   Procedure: LEFT HEART CATH AND CORONARY ANGIOGRAPHY;  Surgeon: Antonette Batters, MD;  Location: [facility] INVASIVE CV LAB;  Service: Cardiovascular;  Laterality: Left;   LEFT HEART CATH AND CORONARY ANGIOGRAPHY N/A 11/01/2019   Procedure: LEFT HEART CATH AND CORONARY ANGIOGRAPHY;  Surgeon: Antonette Batters, MD;  Location: [facility] INVASIVE CV LAB;  Service: Cardiovascular;  Laterality: N/A;   LEFT HEART CATH AND CORONARY ANGIOGRAPHY N/A 07/30/2021   Procedure: LEFT HEART CATH AND CORONARY ANGIOGRAPHY;  Surgeon: Link Rice, MD;  Location: [facility] INVASIVE CV LAB;  Service: Cardiovascular;  Laterality: N/A;    Family Psychiatric History: I have reviewed family psychiatric history from progress note on 08/16/2023.  Family History:  Family History  Problem Relation Age of Onset   Kidney failure Mother  Mental illness Mother    Kidney failure Father    Breast cancer Neg Hx    Bladder Cancer Neg Hx     Social History: I have reviewed social history from progress note on 08/16/2023. Social History   Socioeconomic History   Marital status: Married    Spouse name: Not on file   Number of children: Not on file    Years of education: Not on file   Highest education level: Some college, no degree  Occupational History   Occupation: [employer]  Tobacco Use   Smoking status: Never   Smokeless tobacco: Never  Vaping Use   Vaping status: Never Used  Substance and Sexual Activity   Alcohol use: No   Drug use: No   Sexual activity: Yes  Other Topics Concern   Not on file  Social History Narrative   Not on file   Social Drivers of Health   Financial Resource Strain: Low Risk  (09/09/2023)   Overall Financial Resource Strain (CARDIA)    Difficulty of Paying Living Expenses: Not hard at all  Food Insecurity: No Food Insecurity (09/09/2023)   Hunger Vital Sign    Worried About Running Out of Food in the Last Year: Never true    Ran Out of Food in the Last Year: Never true  Transportation Needs: No Transportation Needs (09/09/2023)   PRAPARE - Administrator, Civil Service (Medical): No    Lack of Transportation (Non-Medical): No  Physical Activity: Insufficiently Active (09/09/2023)   Exercise Vital Sign    Days of Exercise per Week: 4 days    Minutes of Exercise per Session: 30 min  Stress: Stress Concern Present (09/09/2023)   [employer] of Occupational Health - Occupational Stress Questionnaire    Feeling of Stress : To some extent  Social Connections: Moderately Isolated (09/09/2023)   Social Connection and Isolation Panel [NHANES]    Frequency of Communication with Friends and Family: Twice a week    Frequency of Social Gatherings with Friends and Family: Once a week    Attends Religious Services: Never    Database administrator or Organizations: No    Attends Banker Meetings: Never    Marital Status: Married    Allergies:  Allergies  Allergen Reactions   Penicillin G Swelling and Rash    Did it involve swelling of the face/tongue/throat, SOB, or low BP? Yes Did it involve sudden or severe rash/hives, skin peeling, or any reaction on the inside of your mouth or  nose? No Did you need to seek medical attention at a hospital or doctor's office? Yes When did it last happen?      childhood allergy If all above answers are "NO", may proceed with cephalosporin use.     Metabolic Disorder Labs: No results found for: "HGBA1C", "MPG" No results found for: "PROLACTIN" No results found for: "CHOL", "TRIG", "HDL", "CHOLHDL", "VLDL", "LDLCALC" No results found for: "TSH"  Therapeutic Level Labs: No results found for: "LITHIUM" No results found for: "VALPROATE" No results found for: "CBMZ"  Current Medications: Current Outpatient Medications  Medication Sig Dispense Refill   ARIPiprazole (ABILIFY) 2 MG tablet Take 2 mg by mouth daily.     carvedilol (COREG) 3.125 MG tablet Take 3.125 mg by mouth 2 (two) times daily.     clopidogrel  (PLAVIX ) 75 MG tablet Take 75 mg by mouth daily.     cyclobenzaprine (FLEXERIL) 10 MG tablet Take 10 mg by mouth at bedtime.  diltiazem  (CARDIZEM  CD) 180 MG 24 hr capsule Take 180 mg by mouth daily.     eszopiclone  (LUNESTA ) 1 MG TABS tablet Take 1 tablet (1 mg total) by mouth at bedtime as needed for sleep. Take immediately before bedtime 30 tablet 2   fluticasone-salmeterol (ADVAIR HFA) 230-21 MCG/ACT inhaler as needed.     furosemide  (LASIX ) 20 MG tablet Take by mouth as needed.     gabapentin (NEURONTIN) 100 MG capsule Take 200 mg by mouth 2 (two) times daily.     hydrOXYzine (ATARAX) 50 MG tablet Take 1 tablet by mouth at bedtime.     montelukast (SINGULAIR) 10 MG tablet Take 10 mg by mouth daily.     rOPINIRole  (REQUIP ) 1 MG tablet Take 2 mg by mouth See admin instructions. Take 2 mg in the evening and 2 mg at bedtime     rosuvastatin  (CRESTOR ) 40 MG tablet Take 1 tablet (40 mg total) by mouth daily at 6 PM. 30 tablet 12   Tdap (BOOSTRIX) 5-2.5-18.5 LF-MCG/0.5 injection      zonisamide (ZONEGRAN) 100 MG capsule Take 100 mg by mouth 2 (two) times daily.     Zoster Vaccine Adjuvanted (SHINGRIX) injection       acetaminophen  (TYLENOL ) 500 MG tablet Take 500 mg by mouth every 8 (eight) hours as needed for moderate pain or headache. (Patient not taking: Reported on 01/12/2024)     busPIRone (BUSPAR) 10 MG tablet Take 10 mg by mouth 2 (two) times daily. (Patient not taking: Reported on 01/12/2024)     FLUoxetine  (PROZAC ) 40 MG capsule Take 1 capsule (40 mg total) by mouth daily with breakfast. 90 capsule 1   rOPINIRole  (REQUIP ) 4 MG tablet Take by mouth.     No current facility-administered medications for this visit.     Musculoskeletal: Strength & Muscle Tone: within normal limits Gait & Station: normal Patient leans: N/A  Psychiatric Specialty Exam: Review of Systems  Psychiatric/Behavioral: Negative.      Blood pressure 108/67, pulse 76, temperature (!) 96.1 F (35.6 C), temperature source Temporal, height 5\' 6"  (1.676 m), weight 174 lb 12.8 oz (79.3 kg).Body mass index is 28.21 kg/m.  General Appearance: Casual  Eye Contact:  Fair  Speech:  Clear and Coherent  Volume:  Normal  Mood:  Euthymic  Affect:  Congruent  Thought Process:  Goal Directed and Descriptions of Associations: Intact  Orientation:  Full (Time, Place, and Person)  Thought Content: Logical   Suicidal Thoughts:  No  Homicidal Thoughts:  No  Memory:  Immediate;   Fair Recent;   Fair Remote;   Fair  Judgement:  Fair  Insight:  Fair  Psychomotor Activity:  Tremor bilateral upper extremities  Concentration:  Concentration: Fair and Attention Span: Fair  Recall:  [employer] of Knowledge: Fair  Language: Fair  Akathisia:  No  Handed:  Right  AIMS (if indicated):  done  Assets:  Desire for Improvement Housing Social Support Transportation  ADL's:  Intact  Cognition: WNL  Sleep:  Improving   Screenings: Geneticist, molecular Office Visit from 01/12/2024 in [facility] Office Visit from 11/03/2023 in [facility] Office Visit from  08/16/2023 in [facility]  AIMS Total Score 0 0 0      GAD-7    Flowsheet Row Office Visit from 11/03/2023 in [facility] Office Visit from 09/27/2023 in [facility] Counselor from 09/09/2023 in [facility] Office Visit from 08/16/2023 in [facility]  Total GAD-7 Score 4 5 9 5       PHQ2-9    Flowsheet Row Office Visit from 01/12/2024 in [facility] Office Visit from 11/03/2023 in [facility] Office Visit from 09/27/2023 in [facility] Counselor from 09/09/2023 in [facility] Office Visit from 08/16/2023 in [facility]  PHQ-2 Total Score 1 2 2 2 2   PHQ-9 Total Score -- 5 9 9 6       Flowsheet Row Office Visit from 01/12/2024 in [facility] Office Visit from 11/03/2023 in [facility] Office Visit from 09/27/2023 in [facility]  C-SSRS RISK CATEGORY No Risk No Risk No Risk        Assessment and Plan: Renee Stephens is a 22 year old Caucasian female who has a history of depression, insomnia, presented for a follow-up appointment.  Discussed assessment and plan as noted below.  Depression in remission Currently doing well on the current combination of medication.  Denies any worsening mood symptoms. - Continue Fluoxetine  40 mg daily - Continue Abilify 2 mg daily - Continue BuSpar 10 mg twice daily. - Continue Hydroxyzine 50 mg at bedtime.  Insomnia-improving Currently reports sleep is much better.  Currently working on sleep hygiene. - Continue Lunesta  1 mg at  bedtime - Could use Melatonin low dosage as needed. - Continue Requip  as prescribed by primary care provider for restless leg symptoms. - Continue sleep hygiene techniques. - Reviewed [place] PMP AWARxE  Unspecified trauma and related disorder-rule out PTSD-improving Currently under the care of psychotherapist Ms. Deetta Farrow and reports improvement. - Continue CBT.  Reviewed and discussed labs dated 12/21/2023-lipid panel-within normal limits, CBC with differential within normal limits, sodium-140-normal, AST/ALT within normal limits, TSH-3.895-normal, hemoglobin A1c-6.1-slightly elevated.  Patient to work with primary care provider for management of abnormal labs.   Follow-up Follow-up in clinic in 3 months or sooner if needed.   Collaboration of Care: Collaboration of Care: Referral or follow-up with counselor/therapist AEB encouraged to continue CBT with Ms. Deetta Farrow.  Patient/Guardian was advised Release of Information must be obtained prior to any record release in order to collaborate their care with an outside provider. Patient/Guardian was advised if they have not already done so to contact the registration department to sign all necessary forms in order for us  to release information regarding their care.   Consent: Patient/Guardian gives verbal consent for treatment and assignment of benefits for services provided during this visit. Patient/Guardian expressed understanding and agreed to proceed.  This note was generated in part or whole with voice recognition software. Voice recognition is usually quite accurate but there are transcription errors that can and very often do occur. I apologize for any typographical errors that were not detected and corrected.     Saara Kijowski, MD 01/12/2024, 12:48 PM

## 2024-01-27 ENCOUNTER — Ambulatory Visit (INDEPENDENT_AMBULATORY_CARE_PROVIDER_SITE_OTHER): Admitting: Professional Counselor

## 2024-01-27 DIAGNOSIS — F439 Reaction to severe stress, unspecified: Secondary | ICD-10-CM | POA: Diagnosis not present

## 2024-01-27 NOTE — Progress Notes (Signed)
  THERAPIST PROGRESS NOTE  Session Time: 11:00 AM - 11:56 AM  Participation Level: Active  Behavioral Response: Well GroomedAlertAnxious  Type of Therapy: Individual Therapy  Treatment Goals addressed: OP Active Depression LTG (Revised): "I want to take better care of myself.  Stop complaining. Find a feeling of purpose. Get to know myself. Make positive changes in myself."               Start:  09/23/23   Expected End:  09/21/24     Goal: STG: "Improving my sleep." To improve sleep hygiene AEB implementing a nighttime routine and coping mechanisms over the next 12 weeks.   Goal: STG: "I want to work on improving my friendships. (Also marriage)" To improve relationship satisfaction AEB increasing socialization and use of interpersonal effectiveness skills.  ProgressTowards Goals: Initial  Interventions: CBT and Supportive  Summary: Renee Stephens is a 69 y.o. female who presents with a history of depression and trauma. She appeared alert and oriented x5. She reported she is not feeling well but wore a mask so she can come to her session. [name] noted she had trouble with this last homework assignment. She shared what she completed and took additional notes based on feedback during session. [name] was receptive to Socratic questioning and is starting to challenge her thinking. She scored 25 on PCL screening today. [name] expressed understanding about next homework assignment.   Therapist Response: Conducted session with [name]. Began session with check-in/update since previous session. Utilized empathetic and reflective listening. Actively listened as [name] shared completed challenging beliefs worksheets. Used Socratic questioning to challenge negative thinking and offered clarity on challenging questions. Explained next homework assignment - problematic patterns of thinking. Assigned for homework. Administered PCL screening. Scheduled additional appointment and concluded session.    Suicidal/Homicidal: No  Plan: Return again in 3 weeks.  Diagnosis: Trauma and stressor-related disorder  Collaboration of Care: Medication Management AEB chart review  Patient/Guardian was advised Release of Information must be obtained prior to any record release in order to collaborate their care with an outside provider. Patient/Guardian was advised if they have not already done so to contact the registration department to sign all necessary forms in order for us  to release information regarding their care.   Consent: Patient/Guardian gives verbal consent for treatment and assignment of benefits for services provided during this visit. Patient/Guardian expressed understanding and agreed to proceed.   Len Quale, [facility] 01/27/2024

## 2024-02-01 DIAGNOSIS — G250 Essential tremor: Secondary | ICD-10-CM | POA: Diagnosis not present

## 2024-02-01 DIAGNOSIS — E538 Deficiency of other specified B group vitamins: Secondary | ICD-10-CM | POA: Diagnosis not present

## 2024-02-01 DIAGNOSIS — R2689 Other abnormalities of gait and mobility: Secondary | ICD-10-CM | POA: Diagnosis not present

## 2024-02-15 ENCOUNTER — Ambulatory Visit (INDEPENDENT_AMBULATORY_CARE_PROVIDER_SITE_OTHER): Admitting: Professional Counselor

## 2024-02-15 DIAGNOSIS — E538 Deficiency of other specified B group vitamins: Secondary | ICD-10-CM | POA: Diagnosis not present

## 2024-02-15 DIAGNOSIS — F439 Reaction to severe stress, unspecified: Secondary | ICD-10-CM | POA: Diagnosis not present

## 2024-02-15 NOTE — Progress Notes (Signed)
  THERAPIST PROGRESS NOTE  Session Time: 11:01 AM - 11:55 AM   Participation Level: Active  Behavioral Response: Well Groomed, Alert, Euthymic  Type of Therapy: Individual Therapy  Treatment Goals addressed:  OP Active Depression LTG (Revised): I want to take better care of myself.  Stop complaining. Find a feeling of purpose. Get to know myself. Make positive changes in myself.               Start:  09/23/23   Expected End:  09/21/24     Goal: STG: Improving my sleep. To improve sleep hygiene AEB implementing a nighttime routine and coping mechanisms over the next 12 weeks.   Goal: STG: I want to work on improving my friendships. (Also marriage) To improve relationship satisfaction AEB increasing socialization and use of interpersonal effectiveness skills.  ProgressTowards Goals: Progressing  Interventions: CBT and Supportive  Summary: Renee Stephens is a 68 y.o. female who presents with a history of depression and trauma. She appeared alert and oriented x5. She stated things are going well. [name] struggled with the problematic thinking patterns worksheet but shared what she completed. She was receptive to Socratic questioning. [name] engaged in completing challenging beliefs worksheet in session and expressed understanding about homework assignment.   Therapist Response: Conducted session with [name]. Began session with check-in/update since previous session. Utilized empathetic and reflective listening. Reviewed homework assignment - problematic patterns of thinking worksheets. Used Socratic questioning to help challenge negative thinking. Explained and practiced challenging beliefs worksheet. Assigned these for homework. Confirmed next appointment and concluded session.   Suicidal/Homicidal: No  Plan: Return again in 4 weeks.  Diagnosis: Trauma and stressor-related disorder  Collaboration of Care: Medication Management AEB chart review  Patient/Guardian was  advised Release of Information must be obtained prior to any record release in order to collaborate their care with an outside provider. Patient/Guardian was advised if they have not already done so to contact the registration department to sign all necessary forms in order for us  to release information regarding their care.   Consent: Patient/Guardian gives verbal consent for treatment and assignment of benefits for services provided during this visit. Patient/Guardian expressed understanding and agreed to proceed.   Len Quale, [facility] 02/15/2024

## 2024-02-23 DIAGNOSIS — B372 Candidiasis of skin and nail: Secondary | ICD-10-CM | POA: Diagnosis not present

## 2024-03-13 ENCOUNTER — Ambulatory Visit (INDEPENDENT_AMBULATORY_CARE_PROVIDER_SITE_OTHER): Admitting: Professional Counselor

## 2024-03-13 DIAGNOSIS — F439 Reaction to severe stress, unspecified: Secondary | ICD-10-CM | POA: Diagnosis not present

## 2024-03-13 NOTE — Progress Notes (Signed)
  THERAPIST PROGRESS NOTE  Session Time: 1:00 PM - 1:54 PM   Participation Level: Active  Behavioral Response: Well Groomed, Alert, Anxious and Irritable  Type of Therapy: Individual Therapy  Treatment Goals addressed: OP Active Depression LTG (Revised): I want to take better care of myself.  Stop complaining. Find a feeling of purpose. Get to know myself. Make positive changes in myself.               Start:  09/23/23   Expected End:  09/21/24     Goal: STG: Improving my sleep. To improve sleep hygiene AEB implementing a nighttime routine and coping mechanisms over the next 12 weeks.   Goal: STG: I want to work on improving my friendships. (Also marriage) To improve relationship satisfaction AEB increasing socialization and use of interpersonal effectiveness skills.  ProgressTowards Goals: Progressing  Interventions: CBT, Assertiveness Training, and Supportive  Summary: Renee Stephens is a 68 y.o. female who presents with a history of depression and trauma. She appeared alert and oriented x5. She shared homework she completed on challenging beliefs worksheets. She actively listened to [facility] MAN skill and practiced in session. She was receptive to feedback. She was in agreement with newly identified stuck points and will complete worksheets on those as homework. [name] scored 29 on PCL screening.   Therapist Response: Conducted session with [name]. Began session with check-in/update since previous session. Utilized empathetic and reflective listening. Used Socratic questioning to challenge stuck points. Explained and modeled DEAR MAN skill. Had [employer] in session and offered feedback. Identified two new stuck points and assigned homework to complete challenging beliefs worksheets on them. Administered PCL screening. Scheduled additional appointment and concluded session.   Suicidal/Homicidal: No  Plan: Return again in 2 weeks.  Diagnosis: Trauma and  stressor-related disorder  Collaboration of Care: Medication Management AEB chart review  Patient/Guardian was advised Release of Information must be obtained prior to any record release in order to collaborate their care with an outside provider. Patient/Guardian was advised if they have not already done so to contact the registration department to sign all necessary forms in order for us  to release information regarding their care.   Consent: Patient/Guardian gives verbal consent for treatment and assignment of benefits for services provided during this visit. Patient/Guardian expressed understanding and agreed to proceed.   [name], [facility] 03/13/2024

## 2024-03-22 DIAGNOSIS — H903 Sensorineural hearing loss, bilateral: Secondary | ICD-10-CM | POA: Diagnosis not present

## 2024-03-22 DIAGNOSIS — H6123 Impacted cerumen, bilateral: Secondary | ICD-10-CM | POA: Diagnosis not present

## 2024-03-27 ENCOUNTER — Ambulatory Visit (INDEPENDENT_AMBULATORY_CARE_PROVIDER_SITE_OTHER): Admitting: Professional Counselor

## 2024-03-27 DIAGNOSIS — F439 Reaction to severe stress, unspecified: Secondary | ICD-10-CM | POA: Diagnosis not present

## 2024-03-27 NOTE — Progress Notes (Unsigned)
  THERAPIST PROGRESS NOTE  Session Time: 1:02 PM - 1:55 PM   Participation Level: Active  Behavioral Response: Well GroomedAlertAnxious and Irritable  Type of Therapy: Individual Therapy  Treatment Goals addressed: OP Active Depression LTG (Revised): I want to take better care of myself.  Stop complaining. Find a feeling of purpose. Get to know myself. Make positive changes in myself.               Start:  09/23/23   Expected End:  09/21/24     Goal: STG: Improving my sleep. To improve sleep hygiene AEB implementing a nighttime routine and coping mechanisms over the next 12 weeks.   Goal: STG: I want to work on improving my friendships. (Also marriage) To improve relationship satisfaction AEB increasing socialization and use of interpersonal effectiveness skills.  ProgressTowards Goals: Progressing  Interventions: CBT, Motivational Interviewing, Assertiveness Training, and Supportive  Summary: Lella Mullany is a 68 y.o. female who presents with ***.   Therapist Response: Conducted session with [name]. Began session with check-in/update since previous session. Utilized empathetic and reflective listening. Used open-ended questions to facilitate discussion and summarized thoughts/feelings. Myles [name] of plan to review treatment plan next session. Scheduled additional appointment and concluded session.   Suicidal/Homicidal: No  Plan: Return again in 4 weeks.  Diagnosis: Trauma and stressor-related disorder  Collaboration of Care: Medication Management AEB chart review  Patient/Guardian was advised Release of Information must be obtained prior to any record release in order to collaborate their care with an outside provider. Patient/Guardian was advised if they have not already done so to contact the registration department to sign all necessary forms in order for us  to release information regarding their care.   Consent: Patient/Guardian gives verbal consent for  treatment and assignment of benefits for services provided during this visit. Patient/Guardian expressed understanding and agreed to proceed.   [name], [facility] 03/27/2024

## 2024-03-28 DIAGNOSIS — J4540 Moderate persistent asthma, uncomplicated: Secondary | ICD-10-CM | POA: Diagnosis not present

## 2024-04-03 DIAGNOSIS — E538 Deficiency of other specified B group vitamins: Secondary | ICD-10-CM | POA: Diagnosis not present

## 2024-04-04 DIAGNOSIS — R0689 Other abnormalities of breathing: Secondary | ICD-10-CM | POA: Diagnosis not present

## 2024-04-04 DIAGNOSIS — J4540 Moderate persistent asthma, uncomplicated: Secondary | ICD-10-CM | POA: Diagnosis not present

## 2024-04-04 DIAGNOSIS — R0600 Dyspnea, unspecified: Secondary | ICD-10-CM | POA: Diagnosis not present

## 2024-04-04 DIAGNOSIS — T17908A Unspecified foreign body in respiratory tract, part unspecified causing other injury, initial encounter: Secondary | ICD-10-CM | POA: Diagnosis not present

## 2024-04-12 ENCOUNTER — Ambulatory Visit: Admitting: Psychiatry

## 2024-04-18 ENCOUNTER — Encounter: Payer: Self-pay | Admitting: Psychiatry

## 2024-04-18 ENCOUNTER — Ambulatory Visit (INDEPENDENT_AMBULATORY_CARE_PROVIDER_SITE_OTHER): Admitting: Psychiatry

## 2024-04-18 VITALS — BP 122/80 | HR 81 | Temp 98.4°F | Ht 66.0 in | Wt 176.8 lb

## 2024-04-18 DIAGNOSIS — F3342 Major depressive disorder, recurrent, in full remission: Secondary | ICD-10-CM | POA: Diagnosis not present

## 2024-04-18 DIAGNOSIS — F439 Reaction to severe stress, unspecified: Secondary | ICD-10-CM | POA: Diagnosis not present

## 2024-04-18 DIAGNOSIS — G4701 Insomnia due to medical condition: Secondary | ICD-10-CM | POA: Diagnosis not present

## 2024-04-18 MED ORDER — ESZOPICLONE 1 MG PO TABS: 1.0000 mg | ORAL_TABLET | Freq: Every evening | ORAL | 2 refills | Status: DC | PRN

## 2024-04-18 MED ORDER — HYDROXYZINE HCL 50 MG PO TABS: 25.0000 mg | ORAL_TABLET | Freq: Every evening | ORAL | 1 refills | Status: DC | PRN

## 2024-04-18 NOTE — Patient Instructions (Signed)
 Insomnia Insomnia is a sleep disorder that makes it difficult to fall asleep or stay asleep. Insomnia can cause fatigue, low energy, difficulty concentrating, mood swings, and poor performance at work or school. There are three different ways to classify insomnia: Difficulty falling asleep. Difficulty staying asleep. Waking up too early in the morning. Any type of insomnia can be long-term (chronic) or short-term (acute). Both are common. Short-term insomnia usually lasts for 3 months or less. Chronic insomnia occurs at least three times a week for longer than 3 months. What are the causes? Insomnia may be caused by another condition, situation, or substance, such as: Having certain mental health conditions, such as anxiety and depression. Using caffeine, alcohol, tobacco, or drugs. Having gastrointestinal conditions, such as gastroesophageal reflux disease (GERD). Having certain medical conditions. These include: Asthma. Alzheimer's disease. Stroke. Chronic pain. An overactive thyroid  gland (hyperthyroidism). Other sleep disorders, such as restless legs syndrome and sleep apnea. Menopause. Sometimes, the cause of insomnia may not be known. What increases the risk? Risk factors for insomnia include: Gender. Females are affected more often than males. Age. Insomnia is more common as people get older. Stress and certain medical and mental health conditions. Lack of exercise. Having an irregular work schedule. This may include working night shifts and traveling between different time zones. What are the signs or symptoms? If you have insomnia, the main symptom is having trouble falling asleep or having trouble staying asleep. This may lead to other symptoms, such as: Feeling tired or having low energy. Feeling nervous about going to sleep. Not feeling rested in the morning. Having trouble concentrating. Feeling irritable, anxious, or depressed. How is this diagnosed? This condition  may be diagnosed based on: Your symptoms and medical history. Your health care provider may ask about: Your sleep habits. Any medical conditions you have. Your mental health. A physical exam. How is this treated? Treatment for insomnia depends on the cause. Treatment may focus on treating an underlying condition that is causing the insomnia. Treatment may also include: Medicines to help you sleep. Counseling or therapy. Lifestyle adjustments to help you sleep better. Follow these instructions at home: Eating and drinking  Limit or avoid alcohol, caffeinated beverages, and products that contain nicotine and tobacco, especially close to bedtime. These can disrupt your sleep. Do not eat a large meal or eat spicy foods right before bedtime. This can lead to digestive discomfort that can make it hard for you to sleep. Sleep habits  Keep a sleep diary to help you and your health care provider figure out what could be causing your insomnia. Write down: When you sleep. When you wake up during the night. How well you sleep and how rested you feel the next day. Any side effects of medicines you are taking. What you eat and drink. Make your bedroom a dark, comfortable place where it is easy to fall asleep. Put up shades or blackout curtains to block light from outside. Use a white noise machine to block noise. Keep the temperature cool. Limit screen use before bedtime. This includes: Not watching TV. Not using your smartphone, tablet, or computer. Stick to a routine that includes going to bed and waking up at the same times every day and night. This can help you fall asleep faster. Consider making a quiet activity, such as reading, part of your nighttime routine. Try to avoid taking naps during the day so that you sleep better at night. Get out of bed if you are still awake after  15 minutes of trying to sleep. Keep the lights down, but try reading or doing a quiet activity. When you feel  sleepy, go back to bed. General instructions Take over-the-counter and prescription medicines only as told by your health care provider. Exercise regularly as told by your health care provider. However, avoid exercising in the hours right before bedtime. Use relaxation techniques to manage stress. Ask your health care provider to suggest some techniques that may work well for you. These may include: Breathing exercises. Routines to release muscle tension. Visualizing peaceful scenes. Make sure that you drive carefully. Do not drive if you feel very sleepy. Keep all follow-up visits. This is important. Contact a health care provider if: You are tired throughout the day. You have trouble in your daily routine due to sleepiness. You continue to have sleep problems, or your sleep problems get worse. Get help right away if: You have thoughts about hurting yourself or someone else. Get help right away if you feel like you may hurt yourself or others, or have thoughts about taking your own life. Go to your nearest emergency room or: Call 911. Call the National Suicide Prevention Lifeline at 801-343-9412 or 988. This is open 24 hours a day. Text the Crisis Text Line at 204-029-0338. Summary Insomnia is a sleep disorder that makes it difficult to fall asleep or stay asleep. Insomnia can be long-term (chronic) or short-term (acute). Treatment for insomnia depends on the cause. Treatment may focus on treating an underlying condition that is causing the insomnia. Keep a sleep diary to help you and your health care provider figure out what could be causing your insomnia. This information is not intended to replace advice given to you by your health care provider. Make sure you discuss any questions you have with your health care provider. Document Revised: 07/28/2021 Document Reviewed: 07/28/2021 Elsevier Patient Education  2024 [employer].

## 2024-04-18 NOTE — Progress Notes (Unsigned)
 BH MD OP Progress Note  04/18/2024 3:34 PM Renee Stephens  MRN:  [id number]  Chief Complaint:  Chief Complaint  Patient presents with   Follow-up   Depression   Anxiety   Medication Refill   Discussed the use of AI scribe software for clinical note transcription with the patient, who gave verbal consent to proceed.  History of Present Illness Renee Stephens is a 68 year old Caucasian female, married, currently retired, on SSI, lives in [place] with her husband, has a history of depression, insomnia, trauma and stress related disorder was evaluated in office today for a follow-up appointment.  Overall, she reports feeling much better and expresses satisfaction with her current progress. She reports feeling much better and states that she has improved since her last visit. She continues ongoing therapy sessions every 2 to 3 weeks, and she has scheduled her next appointment for next week.  Sleep difficulties persist, particularly with falling asleep at night. She typically goes to bed around 11 p.m. and identifies using her iPad and watching TV as contributing factors to her sleep issues. Relaxation strategies such as coloring and listening to rain sounds help her with sleep hygiene. She reports that her prescription for Lunesta  has run out and she has not taken it recently. Additionally, she notes that her Buspar prescription ran out a few weeks ago and she has not resumed it, stating that she is doing well without it. Her current medications include fluoxetine  40 mg, Abilify 2 mg, and hydroxyzine  HCL 50 mg at bedtime; she uses hydroxyzine  only once daily. She states that hydroxyzine  helps her relax at night and manage anxiety.  She denies any thoughts of harming herself or others.   Visit Diagnosis:    ICD-10-CM   1. Recurrent major depressive disorder, in full remission (HCC)  F33.42 eszopiclone  (LUNESTA ) 1 MG TABS tablet    hydrOXYzine  (ATARAX ) 50 MG tablet    2.  Insomnia due to medical condition  G47.01    Mood, pain,    3. Trauma and stressor-related disorder  F43.9 eszopiclone  (LUNESTA ) 1 MG TABS tablet    hydrOXYzine  (ATARAX ) 50 MG tablet   Other specified, adjustment like disorder with prolonged duration of more than 6 months without prolonged duration of stressors      Past Psychiatric History: I have reviewed past psychiatric history from progress note on 08/16/2023.  Past trials of medications-Wellbutrin .  Past Medical History:  Past Medical History:  Diagnosis Date   Anxiety    Coronary artery disease    Myocardial infarction [facility])     Past Surgical History:  Procedure Laterality Date   ABDOMINAL HYSTERECTOMY  1995   CARDIAC CATHETERIZATION     COLONOSCOPY N/A 06/17/2020   Procedure: COLONOSCOPY;  Surgeon: Toledo, Ladell POUR, MD;  Location: [facility] ENDOSCOPY;  Service: Gastroenterology;  Laterality: N/A;   CORONARY STENT INTERVENTION N/A 12/28/2018   Procedure: CORONARY STENT INTERVENTION;  Surgeon: Florencio Cara BIRCH, MD;  Location: [facility] INVASIVE CV LAB;  Service: Cardiovascular;  Laterality: N/A;   LEFT HEART CATH AND CORONARY ANGIOGRAPHY Left 12/28/2018   Procedure: LEFT HEART CATH AND CORONARY ANGIOGRAPHY;  Surgeon: Florencio Cara BIRCH, MD;  Location: [facility] INVASIVE CV LAB;  Service: Cardiovascular;  Laterality: Left;   LEFT HEART CATH AND CORONARY ANGIOGRAPHY N/A 11/01/2019   Procedure: LEFT HEART CATH AND CORONARY ANGIOGRAPHY;  Surgeon: Florencio Cara BIRCH, MD;  Location: [facility] INVASIVE CV LAB;  Service: Cardiovascular;  Laterality: N/A;   LEFT HEART CATH AND CORONARY ANGIOGRAPHY N/A 07/30/2021  Procedure: LEFT HEART CATH AND CORONARY ANGIOGRAPHY;  Surgeon: Lawyer Bernardino Cough, MD;  Location: [facility] INVASIVE CV LAB;  Service: Cardiovascular;  Laterality: N/A;    Family Psychiatric History: I have reviewed family psychiatric history from progress note on 08/16/2023.  Family History:  Family History  Problem Relation Age of Onset    Kidney failure Mother    Mental illness Mother    Kidney failure Father    Breast cancer Neg Hx    Bladder Cancer Neg Hx     Social History: I have reviewed social history from progress note on 08/16/2023. Social History   Socioeconomic History   Marital status: Married    Spouse name: Not on file   Number of children: Not on file   Years of education: Not on file   Highest education level: Some college, no degree  Occupational History   Occupation: [employer]  Tobacco Use   Smoking status: Never   Smokeless tobacco: Never  Vaping Use   Vaping status: Never Used  Substance and Sexual Activity   Alcohol use: No   Drug use: No   Sexual activity: Yes  Other Topics Concern   Not on file  Social History Narrative   Not on file   Social Drivers of Health   Financial Resource Strain: Low Risk  (09/09/2023)   Overall Financial Resource Strain (CARDIA)    Difficulty of Paying Living Expenses: Not hard at all  Food Insecurity: No Food Insecurity (09/09/2023)   Hunger Vital Sign    Worried About Running Out of Food in the Last Year: Never true    Ran Out of Food in the Last Year: Never true  Transportation Needs: No Transportation Needs (09/09/2023)   PRAPARE - Administrator, Civil Service (Medical): No    Lack of Transportation (Non-Medical): No  Physical Activity: Insufficiently Active (09/09/2023)   Exercise Vital Sign    Days of Exercise per Week: 4 days    Minutes of Exercise per Session: 30 min  Stress: Stress Concern Present (09/09/2023)   [employer] of Occupational Health - Occupational Stress Questionnaire    Feeling of Stress : To some extent  Social Connections: Moderately Isolated (09/09/2023)   Social Connection and Isolation Panel    Frequency of Communication with Friends and Family: Twice a week    Frequency of Social Gatherings with Friends and Family: Once a week    Attends Religious Services: Never    Database administrator or Organizations: No     Attends Banker Meetings: Never    Marital Status: Married    Allergies:  Allergies  Allergen Reactions   Penicillin G Swelling and Rash    Did it involve swelling of the face/tongue/throat, SOB, or low BP? Yes Did it involve sudden or severe rash/hives, skin peeling, or any reaction on the inside of your mouth or nose? No Did you need to seek medical attention at a hospital or doctor's office? Yes When did it last happen?      childhood allergy If all above answers are "NO", may proceed with cephalosporin use.     Metabolic Disorder Labs: No results found for: HGBA1C, MPG No results found for: PROLACTIN No results found for: CHOL, TRIG, HDL, CHOLHDL, VLDL, LDLCALC No results found for: TSH  Therapeutic Level Labs: No results found for: LITHIUM No results found for: VALPROATE No results found for: CBMZ  Current Medications: Current Outpatient Medications  Medication Sig Dispense Refill  acetaminophen  (TYLENOL ) 500 MG tablet Take 500 mg by mouth every 8 (eight) hours as needed for moderate pain or headache.     ARIPiprazole (ABILIFY) 2 MG tablet Take 2 mg by mouth daily.     aspirin  81 MG chewable tablet Chew 81 mg by mouth daily.     carvedilol (COREG) 3.125 MG tablet Take 3.125 mg by mouth 2 (two) times daily.     cyclobenzaprine (FLEXERIL) 10 MG tablet Take 10 mg by mouth at bedtime.     FLUoxetine  (PROZAC ) 40 MG capsule Take 1 capsule (40 mg total) by mouth daily with breakfast. 90 capsule 1   fluticasone-salmeterol (ADVAIR HFA) 230-21 MCG/ACT inhaler Inhale 2 puffs into the lungs 2 (two) times daily.     furosemide  (LASIX ) 20 MG tablet Take by mouth as needed.     gabapentin (NEURONTIN) 100 MG capsule Take 200 mg by mouth 2 (two) times daily.     montelukast (SINGULAIR) 10 MG tablet Take 10 mg by mouth daily.     rOPINIRole  (REQUIP ) 1 MG tablet Take 2 mg by mouth See admin instructions. Take 2 mg in the evening and 2 mg at bedtime      rOPINIRole  (REQUIP ) 4 MG tablet Take by mouth.     rosuvastatin  (CRESTOR ) 40 MG tablet Take 1 tablet (40 mg total) by mouth daily at 6 PM. 30 tablet 12   zonisamide (ZONEGRAN) 50 MG capsule Take 50 mg by mouth 3 (three) times daily.     eszopiclone  (LUNESTA ) 1 MG TABS tablet Take 1 tablet (1 mg total) by mouth at bedtime as needed for sleep. Take immediately before bedtime 30 tablet 2   hydrOXYzine  (ATARAX ) 50 MG tablet Take 0.5-1 tablets (25-50 mg total) by mouth at bedtime as needed. 90 tablet 1   No current facility-administered medications for this visit.     Musculoskeletal: Strength & Muscle Tone: within normal limits Gait & Station: normal Patient leans: N/A  Psychiatric Specialty Exam: Review of Systems  Psychiatric/Behavioral:  Positive for sleep disturbance.     Blood pressure 122/80, pulse 81, temperature 98.4 F (36.9 C), temperature source Temporal, height 5' 6 (1.676 m), weight 176 lb 12.8 oz (80.2 kg), SpO2 97%.Body mass index is 28.54 kg/m.  General Appearance: Casual  Eye Contact:  Fair  Speech:  Clear and Coherent  Volume:  Normal  Mood:  Euthymic  Affect:  Congruent  Thought Process:  Goal Directed and Descriptions of Associations: Intact  Orientation:  Full (Time, Place, and Person)  Thought Content: Logical   Suicidal Thoughts:  No  Homicidal Thoughts:  No  Memory:  Immediate;   Fair Recent;   Fair Remote;   Fair  Judgement:  Fair  Insight:  Fair  Psychomotor Activity:  Normal  Concentration:  Concentration: Fair and Attention Span: Fair  Recall:  [employer] of Knowledge: Fair  Language: Fair  Akathisia:  No  Handed:  Right  AIMS (if indicated): not done  Assets:  Communication Skills Desire for Improvement Housing Social Support Transportation  ADL's:  Intact  Cognition: WNL  Sleep:  Poor   Screenings: Geneticist, molecular Office Visit from 01/12/2024 in [facility] Office Visit from 11/03/2023  in [facility] Office Visit from 08/16/2023 in [facility]  AIMS Total Score 0 0 0   GAD-7    Flowsheet Row Office Visit from 04/18/2024 in [facility] Office Visit from 11/03/2023 in [facility] Office Visit from 09/27/2023 in [facility] Counselor from 09/09/2023 in [facility] Office Visit from 08/16/2023 in [facility]  Total GAD-7 Score 0 4 5 9 5    PHQ2-9    Flowsheet Row Office Visit from 04/18/2024 in [facility] Office Visit from 01/12/2024 in [facility] Office Visit from 11/03/2023 in [facility] Office Visit from 09/27/2023 in [facility] Counselor from 09/09/2023 in [facility]  PHQ-2 Total Score 0 1 2 2 2   PHQ-9 Total Score -- -- 5 9 9    Flowsheet Row Office Visit from 04/18/2024 in [facility] Office Visit from 01/12/2024 in [facility] Office Visit from 11/03/2023 in [facility]  C-SSRS RISK CATEGORY No Risk No Risk No Risk     Assessment and Plan: Anagabriela Jokerst is a 68 year old Caucasian female who has a history of depression, insomnia was evaluated in office today for a follow-up appointment.  Discussed assessment and plan as noted below.  Depression in remission Currently denies any significant depression symptoms.  Well managed on current medication regimen.  Noncompliant on BuSpar and is doing well without it. Discontinue BuSpar for noncompliance. Continue Fluoxetine  40 mg  daily Continue Abilify 2 mg daily  Insomnia-unstable Currently sleep problems are related to lack of sleep hygiene, using electronic devices right before bedtime. Continue Lunesta  1 mg at bedtime Continue Hydroxyzine  50 mg at bedtime as needed Continue Requip  as prescribed by primary care provider for restless leg symptoms. Educated regarding sleep hygiene techniques.  Patient to make use of these strategies. Will reevaluate in future sessions.  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 Continue CBT with Ms. Veva  Follow-up Follow-up in clinic in 3 to 4 months or sooner if needed.   Collaboration of Care: Collaboration of Care: Referral or follow-up with counselor/therapist AEB encouraged to continue psychotherapy sessions.  Patient/Guardian was advised Release of Information must be obtained prior to any record release in order to collaborate their care with an outside provider. Patient/Guardian was advised if they have not already done so to contact the registration department to sign all necessary forms in order for us  to release information regarding their care.   Consent: Patient/Guardian gives verbal consent for treatment and assignment of benefits for services provided during this visit. Patient/Guardian expressed understanding and agreed to proceed.  This note was generated in part or whole with voice recognition software. Voice recognition is usually quite accurate but there are transcription errors that can and very often do occur. I apologize for any typographical errors that were not detected and corrected.     Kadia Abaya, MD 04/19/2024, 6:16 PM

## 2024-04-26 ENCOUNTER — Ambulatory Visit (INDEPENDENT_AMBULATORY_CARE_PROVIDER_SITE_OTHER): Admitting: Professional Counselor

## 2024-04-26 DIAGNOSIS — F3342 Major depressive disorder, recurrent, in full remission: Secondary | ICD-10-CM

## 2024-04-26 DIAGNOSIS — F439 Reaction to severe stress, unspecified: Secondary | ICD-10-CM

## 2024-04-26 NOTE — Progress Notes (Signed)
  THERAPIST PROGRESS NOTE  Session Time: 11:02 AM - 11:56 AM  Participation Level: Active  Behavioral Response: Well Groomed, Alert, Euthymic  Type of Therapy: Individual Therapy  Treatment Goals addressed:  OP Active Depression LTG (Revised): I want to take better care of myself.  Stop complaining. Find a feeling of purpose. Get to know myself. Make positive changes in myself.               Start:  09/23/23   Expected End:  09/21/24     Goal: STG: Improving my sleep. To improve sleep hygiene AEB implementing a nighttime routine and coping mechanisms over the next 12 weeks.   Goal: STG: I want to work on improving my friendships. (Also marriage) To improve relationship satisfaction AEB increasing socialization and use of interpersonal effectiveness skills.  ProgressTowards Goals: Progressing  Interventions: CBT and Supportive  Summary: Renee Stephens is a 68 y.o. female who presents with a history of depression and trauma. She appeared alert and oriented x5. [place] provided updates on her husband's health. She shared challenging beliefs worksheets she completed on additional stuck points. Becky engaged in discussion and shared information she found that supported the therapy she's been completing. She noted it affected her differently than it would have a year ago. Donielle scored 22 on PCL screening. She noted progress on goals. She expressed understanding about plan for next two sessions and homework assignments.   Therapist Response: Conducted session with Renee Stephens. Began session with check-in/update since previous session. Utilized empathetic and reflective listening. Used open-ended questions to facilitate discussion and summarized Beautifull's thoughts/feelings. Administered PCL screening. Reviewed treatment plan with input from [facility]. Discussed plan for next sessions and pending discharge. Explained homework assignment for remaining modules and final impact statement.  Scheduled additional appointment and concluded session.   Suicidal/Homicidal: No  Plan: Return again in 5 weeks.  Diagnosis: Trauma and stressor-related disorder  Recurrent major depressive disorder, in full remission (HCC)  Collaboration of Care: Medication Management AEB chart review  Patient/Guardian was advised Release of Information must be obtained prior to any record release in order to collaborate their care with an outside provider. Patient/Guardian was advised if they have not already done so to contact the registration department to sign all necessary forms in order for us  to release information regarding their care.   Consent: Patient/Guardian gives verbal consent for treatment and assignment of benefits for services provided during this visit. Patient/Guardian expressed understanding and agreed to proceed.   [name], [facility] 04/26/2024

## 2024-05-11 ENCOUNTER — Ambulatory Visit: Admitting: Professional Counselor

## 2024-06-01 ENCOUNTER — Ambulatory Visit: Admitting: Professional Counselor

## 2024-06-09 DIAGNOSIS — Z0100 Encounter for examination of eyes and vision without abnormal findings: Secondary | ICD-10-CM | POA: Diagnosis not present

## 2024-06-09 DIAGNOSIS — H524 Presbyopia: Secondary | ICD-10-CM | POA: Diagnosis not present

## 2024-06-28 ENCOUNTER — Ambulatory Visit (INDEPENDENT_AMBULATORY_CARE_PROVIDER_SITE_OTHER): Admitting: Professional Counselor

## 2024-06-28 DIAGNOSIS — F3342 Major depressive disorder, recurrent, in full remission: Secondary | ICD-10-CM | POA: Diagnosis not present

## 2024-06-28 DIAGNOSIS — Z1331 Encounter for screening for depression: Secondary | ICD-10-CM | POA: Diagnosis not present

## 2024-06-28 DIAGNOSIS — R7303 Prediabetes: Secondary | ICD-10-CM | POA: Diagnosis not present

## 2024-06-28 DIAGNOSIS — I2510 Atherosclerotic heart disease of native coronary artery without angina pectoris: Secondary | ICD-10-CM | POA: Diagnosis not present

## 2024-06-28 DIAGNOSIS — Z79899 Other long term (current) drug therapy: Secondary | ICD-10-CM | POA: Diagnosis not present

## 2024-06-28 DIAGNOSIS — Z0000 Encounter for general adult medical examination without abnormal findings: Secondary | ICD-10-CM | POA: Diagnosis not present

## 2024-06-28 DIAGNOSIS — J4540 Moderate persistent asthma, uncomplicated: Secondary | ICD-10-CM | POA: Diagnosis not present

## 2024-06-28 DIAGNOSIS — Z23 Encounter for immunization: Secondary | ICD-10-CM | POA: Diagnosis not present

## 2024-06-28 DIAGNOSIS — R8290 Unspecified abnormal findings in urine: Secondary | ICD-10-CM | POA: Diagnosis not present

## 2024-06-28 DIAGNOSIS — Z955 Presence of coronary angioplasty implant and graft: Secondary | ICD-10-CM | POA: Diagnosis not present

## 2024-06-28 DIAGNOSIS — E7800 Pure hypercholesterolemia, unspecified: Secondary | ICD-10-CM | POA: Diagnosis not present

## 2024-06-28 NOTE — Progress Notes (Signed)
  THERAPIST PROGRESS NOTE  Session Time: 9:02 AM - 9:48 AM   Participation Level: Active  Behavioral Response: Well Groomed, Alert, Euthymic  Type of Therapy: Individual Therapy  Treatment Goals addressed: Resolved OP Depression  LTG (Revised): I want to take better care of myself.  Stop complaining. Find a feeling of purpose. Get to know myself. Make positive changes in myself.  (Completed/Met)    Start:  09/23/23    Expected End:  09/21/24    Resolved:  06/28/24  STG: Improving my sleep. To improve sleep hygiene AEB implementing a nighttime routine and coping mechanisms over the next 12 weeks.  (Completed/Met)   STG: I want to work on improving my friendships. (Also marriage) To improve relationship satisfaction AEB increasing socialization and use of interpersonal effectiveness skills. (Completed/Met)   ProgressTowards Goals: Met  Interventions: Motivational Interviewing and Supportive  Summary: Renee Stephens is a 68 y.o. female who presents with a history of depression, adjustment disorder, and trauma. She appeared alert and oriented x5. She shared updates since last session, including her husband's surgery and recovery. Renee Stephens shared trust star exercise and read final impact statement. She scored 19 on PCL screening. She noted overall improvements and goals met. She expressed appreciation to this clinical research associate for her healing journey.   Therapist Response: Conducted session with Renee Stephens. Began session with check-in/update since previous session. Utilized empathetic and reflective listening. Used open-ended questions to facilitate discussion and summarized Renee Stephens's thoughts/feelings. Noted progress on goals and administered PCL screening. Agreed upon successful discharge and concluded session.   Suicidal/Homicidal: No  Plan: Successful discharge  Diagnosis: Recurrent major depressive disorder, in full remission  Collaboration of Care: Medication Management AEB chart  review  Patient/Guardian was advised Release of Information must be obtained prior to any record release in order to collaborate their care with an outside provider. Patient/Guardian was advised if they have not already done so to contact the registration department to sign all necessary forms in order for us  to release information regarding their care.   Consent: Patient/Guardian gives verbal consent for treatment and assignment of benefits for services provided during this visit. Patient/Guardian expressed understanding and agreed to proceed.   Renee Stephens, [facility] 06/28/2024

## 2024-06-29 ENCOUNTER — Other Ambulatory Visit: Payer: Self-pay | Admitting: Internal Medicine

## 2024-06-29 DIAGNOSIS — Z1231 Encounter for screening mammogram for malignant neoplasm of breast: Secondary | ICD-10-CM

## 2024-08-15 ENCOUNTER — Encounter: Payer: Self-pay | Admitting: Psychiatry

## 2024-08-15 ENCOUNTER — Other Ambulatory Visit: Payer: Self-pay

## 2024-08-15 ENCOUNTER — Ambulatory Visit: Admitting: Psychiatry

## 2024-08-15 VITALS — BP 108/64 | HR 82 | Temp 98.2°F | Ht 66.0 in | Wt 183.0 lb

## 2024-08-15 DIAGNOSIS — G4701 Insomnia due to medical condition: Secondary | ICD-10-CM

## 2024-08-15 DIAGNOSIS — F439 Reaction to severe stress, unspecified: Secondary | ICD-10-CM

## 2024-08-15 DIAGNOSIS — F3342 Major depressive disorder, recurrent, in full remission: Secondary | ICD-10-CM

## 2024-08-15 MED ORDER — FLUOXETINE HCL 40 MG PO CAPS: 40.0000 mg | ORAL_CAPSULE | Freq: Every day | ORAL | 1 refills | Status: AC

## 2024-08-15 MED ORDER — ESZOPICLONE 1 MG PO TABS: 1.0000 mg | ORAL_TABLET | Freq: Every evening | ORAL | 1 refills | Status: AC | PRN

## 2024-08-15 NOTE — Progress Notes (Unsigned)
 BH MD OP Progress Note  08/15/2024 1:34 PM Renee Stephens  MRN:  [id number]  Chief Complaint:  Chief Complaint  Patient presents with   Follow-up   Anxiety   Medication Refill   Insomnia   Discussed the use of AI scribe software for clinical note transcription with the patient, who gave verbal consent to proceed.  History of Present Illness Renee Stephens is a 68 year old Caucasian female, married, currently retired, on SSI, lives in [place] with her husband, has a history of depression, insomnia, trauma and stress related disorder, essential tremors, was evaluated in office today for a follow-up appointment.  She reports improvement in mood since the last visit in August, with completion of therapy with Ms. Almarie Ligas. She describes going for weeks without experiencing distressing or negative thoughts. She denies trauma-related symptoms, including intrusive memories, flashbacks, or nightmares.  Seasonal worsening of depression in December occurs, which she relates to the holidays, the loss of her parents, and limited contact with her only sister. She reports that her husband's recent open heart surgery in September and his ongoing health issues, including difficulty regulating his blood pressure and his own depression, have made things harder for her emotionally during this period.  She continues to experience sleep difficulties, with poor sleep typically occurring for 2 nights followed by a night of sleeping through. Over a 2-week period, she estimates experiencing sleep problems on approximately 4 nights. She takes Lunesta  (eszopiclone ) 1 mg on those nights, which helps her sleep. She reports that she has reduced her screen time before bed and typically goes to bed around 11 PM.  She is also compliant on aripiprazole, hydroxyzine  and fluoxetine .  Denies side effects.  She denies any thoughts of self-harm, suicidal ideation, or thoughts of harming  others.     Visit Diagnosis:    ICD-10-CM   1. Recurrent major depressive disorder, in full remission  F33.42 eszopiclone  (LUNESTA ) 1 MG TABS tablet    FLUoxetine  (PROZAC ) 40 MG capsule    2. Insomnia due to medical condition  G47.01    Mood, pain    3. Trauma and stressor-related disorder  F43.9 eszopiclone  (LUNESTA ) 1 MG TABS tablet   Other specified, adjustment like disorder with prolonged duration of more than 6 months without prolonged duration of stressors      Past Psychiatric History: I have reviewed past psychiatric history from progress note on 08/16/2023.  Past source of medications-Wellbutrin   Past Medical History:  Past Medical History:  Diagnosis Date   Anxiety    Coronary artery disease    Myocardial infarction [facility])     Past Surgical History:  Procedure Laterality Date   ABDOMINAL HYSTERECTOMY  1995   CARDIAC CATHETERIZATION     COLONOSCOPY N/A 06/17/2020   Procedure: COLONOSCOPY;  Surgeon: Toledo, Ladell POUR, MD;  Location: [facility] ENDOSCOPY;  Service: Gastroenterology;  Laterality: N/A;   CORONARY STENT INTERVENTION N/A 12/28/2018   Procedure: CORONARY STENT INTERVENTION;  Surgeon: Florencio Cara BIRCH, MD;  Location: [facility] INVASIVE CV LAB;  Service: Cardiovascular;  Laterality: N/A;   LEFT HEART CATH AND CORONARY ANGIOGRAPHY Left 12/28/2018   Procedure: LEFT HEART CATH AND CORONARY ANGIOGRAPHY;  Surgeon: Florencio Cara BIRCH, MD;  Location: [facility] INVASIVE CV LAB;  Service: Cardiovascular;  Laterality: Left;   LEFT HEART CATH AND CORONARY ANGIOGRAPHY N/A 11/01/2019   Procedure: LEFT HEART CATH AND CORONARY ANGIOGRAPHY;  Surgeon: Florencio Cara BIRCH, MD;  Location: [facility] INVASIVE CV LAB;  Service: Cardiovascular;  Laterality: N/A;   LEFT  HEART CATH AND CORONARY ANGIOGRAPHY N/A 07/30/2021   Procedure: LEFT HEART CATH AND CORONARY ANGIOGRAPHY;  Surgeon: Lawyer Bernardino Cough, MD;  Location: [facility] INVASIVE CV LAB;  Service: Cardiovascular;  Laterality: N/A;    Family Psychiatric  History: Reviewed family psychiatric history from progress note on 08/16/2023.  Family History:  Family History  Problem Relation Age of Onset   Kidney failure Mother    Mental illness Mother    Kidney failure Father    Breast cancer Neg Hx    Bladder Cancer Neg Hx     Social History: I have reviewed social history from progress note on 08/16/2023. Social History   Socioeconomic History   Marital status: Married    Spouse name: Not on file   Number of children: Not on file   Years of education: Not on file   Highest education level: Some college, no degree  Occupational History   Occupation: [employer]  Tobacco Use   Smoking status: Never   Smokeless tobacco: Never  Vaping Use   Vaping status: Never Used  Substance and Sexual Activity   Alcohol use: No   Drug use: No   Sexual activity: Yes  Other Topics Concern   Not on file  Social History Narrative   Not on file   Social Drivers of Health   Tobacco Use: Low Risk (08/15/2024)   Patient History    Smoking Tobacco Use: Never    Smokeless Tobacco Use: Never    Passive Exposure: Not on file  Financial Resource Strain: Low Risk  (06/28/2024)   Received from [facility]   Overall Financial Resource Strain (CARDIA)    Difficulty of Paying Living Expenses: Not hard at all  Food Insecurity: No Food Insecurity (06/28/2024)   Received from [facility]   Epic    Within the past 12 months, you worried that your food would run out before you got the money to buy more.: Never true    Within the past 12 months, the food you bought just didn't last and you didn't have money to get more.: Never true  Transportation Needs: No Transportation Needs (06/28/2024)   Received from [facility] - Transportation    In the past 12 months, has lack of transportation kept you from medical appointments or from getting medications?: No    Lack of Transportation (Non-Medical): No   Physical Activity: Insufficiently Active (09/09/2023)   Exercise Vital Sign    Days of Exercise per Week: 4 days    Minutes of Exercise per Session: 30 min  Stress: Stress Concern Present (09/09/2023)   [employer] of Occupational [employer] - Occupational Stress Questionnaire    Feeling of Stress : To some extent  Social Connections: Moderately Isolated (09/09/2023)   Social Connection and Isolation Panel    Frequency of Communication with Friends and Family: Twice a week    Frequency of Social Gatherings with Friends and Family: Once a week    Attends Religious Services: Never    Database Administrator or Organizations: No    Attends Banker Meetings: Never    Marital Status: Married  Depression (PHQ2-9): Low Risk (04/18/2024)   Depression (PHQ2-9)    PHQ-2 Score: 0  Alcohol Screen: Low Risk (09/09/2023)   Alcohol Screen    Last Alcohol Screening Score (AUDIT): 0  Housing: Low Risk  (06/28/2024)   Received [facility]    In  the last 12 months, was there a time when you were not able to pay the mortgage or rent on time?: No    In the past 12 months, how many times have you moved where you were living?: 0    At any time in the past 12 months, were you homeless or living in a shelter (including now)?: No  Utilities: Not At Risk (06/28/2024)   Received from [facility]   Epic    In the past 12 months has the electric, gas, oil, or water company threatened to shut off services in your home?: No  Health Literacy: Adequate Health Literacy (09/09/2023)   B1300 Health Literacy    Frequency of need for help with medical instructions: Never    Allergies: Allergies[1]  Metabolic Disorder Labs: No results found for: HGBA1C, MPG No results found for: PROLACTIN No results found for: CHOL, TRIG, HDL, CHOLHDL, VLDL, LDLCALC No results found for: TSH  Therapeutic Level Labs: No results found for: LITHIUM No  results found for: VALPROATE No results found for: CBMZ  Current Medications: Current Outpatient Medications  Medication Sig Dispense Refill   acetaminophen  (TYLENOL ) 500 MG tablet Take 500 mg by mouth every 8 (eight) hours as needed for moderate pain or headache.     ARIPiprazole (ABILIFY) 2 MG tablet Take 2 mg by mouth daily.     aspirin  81 MG chewable tablet Chew 81 mg by mouth daily.     carvedilol (COREG) 3.125 MG tablet Take 3.125 mg by mouth 2 (two) times daily.     cyclobenzaprine (FLEXERIL) 10 MG tablet Take 10 mg by mouth at bedtime.     eszopiclone  (LUNESTA ) 1 MG TABS tablet Take 1 tablet (1 mg total) by mouth at bedtime as needed for sleep. Take immediately before bedtime 30 tablet 1   FLUoxetine  (PROZAC ) 40 MG capsule Take 1 capsule (40 mg total) by mouth daily with breakfast. 90 capsule 1   fluticasone-salmeterol (ADVAIR HFA) 230-21 MCG/ACT inhaler Inhale 2 puffs into the lungs 2 (two) times daily.     furosemide  (LASIX ) 20 MG tablet Take by mouth as needed.     gabapentin (NEURONTIN) 100 MG capsule Take 200 mg by mouth 2 (two) times daily.     hydrOXYzine  (ATARAX ) 50 MG tablet Take 0.5-1 tablets (25-50 mg total) by mouth at bedtime as needed. 90 tablet 1   montelukast (SINGULAIR) 10 MG tablet Take 10 mg by mouth daily.     rOPINIRole  (REQUIP ) 1 MG tablet Take 2 mg by mouth See admin instructions. Take 2 mg in the evening and 2 mg at bedtime     rOPINIRole  (REQUIP ) 4 MG tablet Take by mouth.     rosuvastatin  (CRESTOR ) 40 MG tablet Take 1 tablet (40 mg total) by mouth daily at 6 PM. 30 tablet 12   zonisamide (ZONEGRAN) 50 MG capsule Take 50 mg by mouth 3 (three) times daily.     No current facility-administered medications for this visit.     Musculoskeletal: Strength & Muscle Tone: within normal limits Gait & Station: normal Patient leans: N/A  Psychiatric Specialty Exam: Review of Systems  Psychiatric/Behavioral:  Positive for sleep disturbance.     Blood  pressure 108/64, pulse 82, temperature 98.2 F (36.8 C), temperature source Temporal, height 5' 6 (1.676 m), weight 183 lb (83 kg).Body mass index is 29.54 kg/m.  General Appearance: Fairly Groomed  Eye Contact:  Fair  Speech:  Clear and Coherent  Volume:  Normal  Mood:  Euthymic  Affect:  Congruent  Thought Process:  Goal Directed and Descriptions of Associations: Intact  Orientation:  Full (Time, Place, and Person)  Thought Content: Logical   Suicidal Thoughts:  No  Homicidal Thoughts:  No  Memory:  Immediate;   Fair Recent;   Fair Remote;   Fair  Judgement:  Fair  Insight:  Fair  Psychomotor Activity:  Normal  Concentration:  Concentration: Fair and Attention Span: Fair  Recall:  [employer] of Knowledge: Fair  Language: Fair  Akathisia:  No  Handed:  Right  AIMS (if indicated): not done  Assets:  Therapist, Sports  ADL's:  Intact  Cognition: WNL  Sleep:  varies   Screenings: Geneticist, Molecular Office Visit from 08/15/2024 in [facility] Office Visit from 01/12/2024 in [facility] Office Visit from 11/03/2023 in [facility] Office Visit from 08/16/2023 in [facility]  AIMS Total Score 0 0 0 0   GAD-7    Flowsheet Row Office Visit from 04/18/2024 in [facility] Office Visit from 11/03/2023 in [facility] Office Visit from 09/27/2023 in [facility] Counselor from 09/09/2023 in [facility] Office Visit from 08/16/2023 in [facility]  Total GAD-7 Score 0 4 5 9 5    PHQ2-9    Flowsheet Row Office Visit from 04/18/2024 in [facility]  Office Visit from 01/12/2024 in [facility] Office Visit from 11/03/2023 in [facility] Office Visit from 09/27/2023 in [facility] Counselor from 09/09/2023 in [facility]  PHQ-2 Total Score 0 1 2 2 2   PHQ-9 Total Score -- -- 5 9 9    Flowsheet Row Office Visit from 04/18/2024 in [facility] Office Visit from 01/12/2024 in [facility] Office Visit from 11/03/2023 in [facility]  C-SSRS RISK CATEGORY No Risk No Risk No Risk     Assessment and Plan: Winry Egnew is a 68 year old Caucasian female who presented for a follow-up appointment, discussed assessment and plan as noted below.  1. Recurrent major depressive disorder, in full remission Currently denies any significant depression symptoms Continue Fluoxetine  40 mg daily Continue Abilify 2 mg daily  2. Insomnia due to medical condition-improving Does have sleep problems most likely due to lack of sleep hygiene. Encouraged to work on sleep hygiene techniques including avoiding using electronic devices. Continue Lunesta  1 mg at bedtime, currently uses twice a week or so. Continue Requip  as prescribed by primary care provider for restless leg symptoms. Reviewed [place] AWARxE   3. Trauma and stressor-related disorder, adjustment like disorder of more than 6 months in duration currently in remission. Currently denies any significant trauma related symptoms. Continue CBT with Ms. Rhae as needed.  Follow-up Follow-up in clinic in 3 months or sooner if needed.   Consent: Patient/Guardian gives verbal consent for treatment and assignment of benefits for services provided during this visit. Patient/Guardian expressed understanding and agreed to proceed.    This note was generated in part or whole with voice recognition software. Voice recognition is usually quite accurate but there are transcription errors that can and very often do occur.  I apologize for any typographical errors that were not detected and corrected.    Abigail Teall, MD 08/15/2024, 1:34 PM     [1]  Allergies Allergen Reactions   Penicillin G Swelling and Rash    Did it involve swelling of the face/tongue/throat, SOB, or low BP? Yes Did it involve sudden or severe rash/hives, skin peeling, or any reaction on the inside of your mouth or nose? No Did you need to seek medical attention at a hospital or doctor's office? Yes When did it last happen?      childhood allergy If all above answers are NO, may proceed with cephalosporin use.

## 2024-09-11 ENCOUNTER — Other Ambulatory Visit: Payer: Self-pay | Admitting: Psychiatry

## 2024-09-11 DIAGNOSIS — F439 Reaction to severe stress, unspecified: Secondary | ICD-10-CM

## 2024-09-11 DIAGNOSIS — F3342 Major depressive disorder, recurrent, in full remission: Secondary | ICD-10-CM

## 2024-09-20 ENCOUNTER — Ambulatory Visit
Admission: RE | Admit: 2024-09-20 | Discharge: 2024-09-20 | Disposition: A | Discharge status: Home / Self Care | Source: Ambulatory Visit | Attending: Internal Medicine | Admitting: Internal Medicine

## 2024-09-20 DIAGNOSIS — Z1231 Encounter for screening mammogram for malignant neoplasm of breast: Secondary | ICD-10-CM | POA: Diagnosis present

## 2024-11-13 ENCOUNTER — Ambulatory Visit: Admitting: Psychiatry
